# Patient Record
Sex: Female | Born: 1970 | Race: White | Hispanic: No | Marital: Married | Smoking: Former smoker
Health system: Southern US, Community
[De-identification: ages and names within clinical notes are randomized; demographics above are authoritative.]

## PROBLEM LIST (undated history)

## (undated) DIAGNOSIS — Z9889 Other specified postprocedural states: Secondary | ICD-10-CM

## (undated) DIAGNOSIS — Z923 Personal history of irradiation: Secondary | ICD-10-CM

## (undated) DIAGNOSIS — T4145XA Adverse effect of unspecified anesthetic, initial encounter: Secondary | ICD-10-CM

## (undated) DIAGNOSIS — I10 Essential (primary) hypertension: Secondary | ICD-10-CM

## (undated) DIAGNOSIS — R112 Nausea with vomiting, unspecified: Secondary | ICD-10-CM

## (undated) DIAGNOSIS — M539 Dorsopathy, unspecified: Secondary | ICD-10-CM

## (undated) DIAGNOSIS — E538 Deficiency of other specified B group vitamins: Secondary | ICD-10-CM

## (undated) DIAGNOSIS — T8859XA Other complications of anesthesia, initial encounter: Secondary | ICD-10-CM

## (undated) DIAGNOSIS — Z973 Presence of spectacles and contact lenses: Secondary | ICD-10-CM

## (undated) DIAGNOSIS — K219 Gastro-esophageal reflux disease without esophagitis: Secondary | ICD-10-CM

## (undated) DIAGNOSIS — C50919 Malignant neoplasm of unspecified site of unspecified female breast: Secondary | ICD-10-CM

## (undated) DIAGNOSIS — R519 Headache, unspecified: Secondary | ICD-10-CM

## (undated) DIAGNOSIS — Z87442 Personal history of urinary calculi: Secondary | ICD-10-CM

## (undated) DIAGNOSIS — C801 Malignant (primary) neoplasm, unspecified: Secondary | ICD-10-CM

## (undated) DIAGNOSIS — R51 Headache: Secondary | ICD-10-CM

## (undated) DIAGNOSIS — F419 Anxiety disorder, unspecified: Secondary | ICD-10-CM

## (undated) DIAGNOSIS — G479 Sleep disorder, unspecified: Secondary | ICD-10-CM

## (undated) DIAGNOSIS — R42 Dizziness and giddiness: Secondary | ICD-10-CM

## (undated) HISTORY — PX: REDUCTION MAMMAPLASTY: SUR839

---

## 2006-11-09 ENCOUNTER — Ambulatory Visit: Payer: Self-pay | Admitting: Obstetrics and Gynecology

## 2007-11-12 ENCOUNTER — Ambulatory Visit: Payer: Self-pay | Admitting: Obstetrics and Gynecology

## 2007-11-13 ENCOUNTER — Ambulatory Visit: Payer: Self-pay | Admitting: Obstetrics and Gynecology

## 2008-01-07 ENCOUNTER — Ambulatory Visit: Payer: Self-pay | Admitting: Internal Medicine

## 2008-11-25 ENCOUNTER — Ambulatory Visit: Payer: Self-pay | Admitting: Obstetrics and Gynecology

## 2009-12-11 ENCOUNTER — Ambulatory Visit: Payer: Self-pay | Admitting: Obstetrics and Gynecology

## 2010-01-26 ENCOUNTER — Ambulatory Visit: Payer: Self-pay | Admitting: Family Medicine

## 2010-04-14 ENCOUNTER — Ambulatory Visit: Payer: Self-pay | Admitting: Sports Medicine

## 2011-01-05 ENCOUNTER — Ambulatory Visit: Payer: Self-pay | Admitting: Obstetrics and Gynecology

## 2012-01-27 ENCOUNTER — Ambulatory Visit: Payer: Self-pay | Admitting: Obstetrics and Gynecology

## 2012-11-27 ENCOUNTER — Ambulatory Visit: Payer: Self-pay | Admitting: Family Medicine

## 2013-01-28 ENCOUNTER — Ambulatory Visit: Payer: Self-pay | Admitting: Obstetrics and Gynecology

## 2013-09-14 ENCOUNTER — Ambulatory Visit: Payer: Self-pay | Admitting: Family Medicine

## 2013-09-30 ENCOUNTER — Ambulatory Visit: Payer: Self-pay

## 2014-03-26 ENCOUNTER — Ambulatory Visit: Payer: Self-pay | Admitting: Obstetrics and Gynecology

## 2015-01-21 ENCOUNTER — Other Ambulatory Visit: Payer: Self-pay | Admitting: Obstetrics and Gynecology

## 2015-01-21 DIAGNOSIS — Z1231 Encounter for screening mammogram for malignant neoplasm of breast: Secondary | ICD-10-CM

## 2015-03-30 ENCOUNTER — Ambulatory Visit: Payer: Self-pay

## 2015-04-27 ENCOUNTER — Ambulatory Visit: Payer: Self-pay

## 2015-04-28 ENCOUNTER — Ambulatory Visit
Admission: RE | Admit: 2015-04-28 | Discharge: 2015-04-28 | Disposition: A | Discharge status: Home / Self Care | Payer: Managed Care, Other (non HMO) | Source: Ambulatory Visit | Attending: Obstetrics and Gynecology | Admitting: Obstetrics and Gynecology

## 2015-04-28 DIAGNOSIS — Z1231 Encounter for screening mammogram for malignant neoplasm of breast: Secondary | ICD-10-CM | POA: Insufficient documentation

## 2015-06-01 ENCOUNTER — Other Ambulatory Visit: Payer: Self-pay

## 2015-06-01 DIAGNOSIS — B379 Candidiasis, unspecified: Secondary | ICD-10-CM

## 2015-06-01 MED ORDER — FLUCONAZOLE 150 MG PO TABS: 150.0000 mg | ORAL_TABLET | Freq: Once | ORAL | Status: DC

## 2015-08-03 ENCOUNTER — Encounter: Payer: Self-pay | Admitting: Family Medicine

## 2015-08-03 ENCOUNTER — Ambulatory Visit (INDEPENDENT_AMBULATORY_CARE_PROVIDER_SITE_OTHER): Payer: Managed Care, Other (non HMO) | Admitting: Family Medicine

## 2015-08-03 VITALS — BP 134/80 | HR 80 | Ht 65.0 in | Wt 166.0 lb

## 2015-08-03 DIAGNOSIS — G43909 Migraine, unspecified, not intractable, without status migrainosus: Secondary | ICD-10-CM | POA: Insufficient documentation

## 2015-08-03 DIAGNOSIS — R030 Elevated blood-pressure reading, without diagnosis of hypertension: Secondary | ICD-10-CM | POA: Diagnosis not present

## 2015-08-03 DIAGNOSIS — IMO0001 Reserved for inherently not codable concepts without codable children: Secondary | ICD-10-CM

## 2015-08-03 DIAGNOSIS — J0100 Acute maxillary sinusitis, unspecified: Secondary | ICD-10-CM | POA: Diagnosis not present

## 2015-08-03 MED ORDER — FLUTICASONE PROPIONATE 50 MCG/ACT NA SUSP: 2.0000 | Freq: Every day | NASAL | Status: DC

## 2015-08-03 MED ORDER — MECLIZINE HCL 25 MG PO TABS: 25.0000 mg | ORAL_TABLET | Freq: Three times a day (TID) | ORAL | Status: DC | PRN

## 2015-08-03 MED ORDER — AMOXICILLIN 500 MG PO CAPS: 500.0000 mg | ORAL_CAPSULE | Freq: Three times a day (TID) | ORAL | Status: DC

## 2015-08-03 NOTE — Progress Notes (Signed)
Name: Tiffany Humphrey   MRN: [id number]    DOB: 1971-05-02   Date:08/03/2015       Progress Note  Subjective  Chief Complaint  Chief Complaint  Patient presents with  . Dizziness    Dizziness This is a new problem. The current episode started in the past 7 days. The problem occurs daily. The problem has been waxing and waning. Associated symptoms include congestion, coughing, headaches and vertigo. Pertinent negatives include no abdominal pain, chest pain, chills, diaphoresis, fever, myalgias, nausea, neck pain, numbness, rash, sore throat, visual change, vomiting or weakness. Nothing aggravates the symptoms. She has tried acetaminophen for the symptoms. The treatment provided mild relief.  Sinusitis This is a new problem. The current episode started in the past 7 days. The problem has been waxing and waning since onset. Her pain is at a severity of 5/10. The pain is moderate. Associated symptoms include congestion, coughing, headaches and sinus pressure. Pertinent negatives include no chills, diaphoresis, ear pain, hoarse voice, neck pain, shortness of breath or sore throat. The treatment provided mild relief.    No problem-specific assessment & plan notes found for this encounter.   History reviewed. No pertinent past medical history.  History reviewed. No pertinent past surgical history.  Family History  Problem Relation Age of Onset  . Breast cancer Mother 68  . Breast cancer Paternal Grandmother     Social History   Social History  . Marital Status: Married    Spouse Name: N/A  . Number of Children: N/A  . Years of Education: N/A   Occupational History  . Not on file.   Social History Main Topics  . Smoking status: Former Research scientist (life sciences)  . Smokeless tobacco: Not on file  . Alcohol Use: No  . Drug Use: No  . Sexual Activity: Yes   Other Topics Concern  . Not on file   Social History Narrative    Allergies  Allergen Reactions  . Codeine Other (See Comments)   tachycardia     Review of Systems  Constitutional: Negative for fever, chills, weight loss, malaise/fatigue and diaphoresis.  HENT: Positive for congestion and sinus pressure. Negative for ear discharge, ear pain, hoarse voice and sore throat.   Eyes: Negative for blurred vision.  Respiratory: Positive for cough. Negative for sputum production, shortness of breath and wheezing.   Cardiovascular: Negative for chest pain, palpitations and leg swelling.  Gastrointestinal: Negative for heartburn, nausea, vomiting, abdominal pain, diarrhea, constipation, blood in stool and melena.  Genitourinary: Negative for dysuria, urgency, frequency and hematuria.  Musculoskeletal: Negative for myalgias, back pain, joint pain and neck pain.  Skin: Negative for rash.  Neurological: Positive for dizziness, vertigo and headaches. Negative for tingling, sensory change, focal weakness, weakness and numbness.  Endo/Heme/Allergies: Negative for environmental allergies and polydipsia. Does not bruise/bleed easily.  Psychiatric/Behavioral: Negative for depression and suicidal ideas. The patient is not nervous/anxious and does not have insomnia.      Objective  Filed Vitals:   08/03/15 1347  BP: 134/80  Pulse: 80  Height: 5\' 5"  (1.651 m)  Weight: 166 lb (75.297 kg)    Physical Exam  Constitutional: She is well-developed, well-nourished, and in no distress. No distress.  HENT:  Head: Normocephalic and atraumatic.  Right Ear: External ear normal. A middle ear effusion is present.  Left Ear: External ear normal. A middle ear effusion is present.  Nose: Nose normal.  Mouth/Throat: Oropharynx is clear and moist.  Eyes: Conjunctivae and EOM are normal. Pupils  are equal, round, and reactive to light. Right eye exhibits no discharge. Left eye exhibits no discharge.  Neck: Normal range of motion. Neck supple. No JVD present. No thyromegaly present.  Cardiovascular: Normal rate, regular rhythm, normal heart sounds  and intact distal pulses.  Exam reveals no gallop and no friction rub.   No murmur heard. Pulmonary/Chest: Effort normal and breath sounds normal.  Abdominal: Soft. Bowel sounds are normal. She exhibits no mass. There is no tenderness. There is no guarding.  Musculoskeletal: Normal range of motion. She exhibits no edema.  Lymphadenopathy:    She has no cervical adenopathy.  Neurological: She is alert. She has normal reflexes.  Skin: Skin is warm and dry. She is not diaphoretic.  Psychiatric: Mood and affect normal.      Assessment & Plan  Problem List Items Addressed This Visit    None    Visit Diagnoses    Acute maxillary sinusitis, recurrence not specified    -  Primary    Relevant Medications    amoxicillin (AMOXIL) 500 MG capsule    fluticasone (FLONASE) 50 MCG/ACT nasal spray    Elevated blood pressure        Relevant Medications    meclizine (ANTIVERT) 25 MG tablet    fluticasone (FLONASE) 50 MCG/ACT nasal spray         Dr. Justis Dupas [facility]  08/03/2015

## 2015-08-03 NOTE — Patient Instructions (Signed)
Labyrinthitis Labyrinthitis is an infection of the inner ear. Your inner ear is a fluid-filled system of tubes and canals (labyrinth). Nerve cells in your inner ear send signals for hearing and balance to your brain. When tiny germs (microorganisms) get inside the labyrinth, they harm the cells that send messages to the brain. Labyrinthitis can cause changes in hearing and balance. Most cases of labyrinthitis come on suddenly and they clear up within weeks. If the infection damages parts of the labyrinth, some symptoms may remain (chronic labyrinthitis). CAUSES Viruses are the most common cause of labyrinthitis. Viruses that spread into the labyrinth are the same viruses that cause other diseases, such as:  Mononucleosis.  Measles.  Flu.  Herpes. Bacteria can also cause labyrinthitis when they spread into the labyrinth from an infection in the brain or the middle ear. Bacteria can cause:  Serous labyrinthitis. This type of labyrinthitis develops when bacteria produce a poison (toxin) that gets inside the labyrinth.  Suppurative labyrinthitis. This type of labyrinthitis develops when bacteria get inside the labyrinth. RISK FACTORS You may be at greater risk for labyrinthitis if you:  Drink a lot of alcohol.  Smoke.  Take certain drugs.  Are not well rested (fatigued).  Are under a lot of stress.  Have allergies.  Recently had a nose or throat infection (upper respiratory infection) or an ear infection. SYMPTOMS Symptoms of labyrinthitis usually start suddenly. The symptoms can be mild or strong and may include:  Dizziness.  Hearing loss.  A feeling that you are moving when you are not (vertigo).  Ringing in the ear (tinnitus).  Nausea and vomiting.  Trouble focusing your eyes. Symptoms of chronic labyrinthitis may include:  Fatigue.  Confusion.  Hearing loss.  Tinnitus.  Poor balance.  Vertigo after sudden head movements. DIAGNOSIS Your health care  provider may suspect labyrinthitis if you suddenly get dizzy and lose hearing, especially if you had a recent upper respiratory infection. Your health care provider will perform a physical exam to:  Check your ears for infection.  Test your balance.  Check your eye movement. Your health care provider may do several tests to rule out other causes of your symptoms and to help make a diagnosis of labyrinthitis. These may include:  Imaging studies, such as a CT scan or an MRI, to look for other causes of your symptoms.  Hearing tests.  Electronystagmography (ENG) to check your balance. TREATMENT Treatment of labyrinthitis depends on the cause. If your labyrinthitis is caused by a virus, it may get better without treatment. If your labyrinthitis is caused by bacteria, you may need medicine to fight the infection (antibiotic medicine). You may also have treatment to relieve labyrinthitis symptoms. Treatments may include:  Medicines to:  Stop dizziness.  Relieve nausea.  Treat the inflamed area.  Speed up your recovery.  Bed rest until dizziness goes away.  Fluids given through an IV tube. You may need this treatment if you have too little fluid in your body (dehydrated) from repeated nausea and vomiting. HOME CARE INSTRUCTIONS  Take medicines only as directed by your health care provider.  If you were prescribed an antibiotic medicine, finish all of it even if you start to feel better.  Rest as much as possible.  Avoid loud noises and bright lights.  Do not make sudden movements until any dizziness goes away.  Do not drive until your health care provider says that you can.  Drink enough fluid to keep your urine clear or pale yellow.  Work with a physical therapist if you still feel dizzy after several weeks. A therapist can teach you exercises to help you adjust to feeling dizzy (vestibular rehabilitation exercises).  Keep all follow-up visits as directed by your health care  provider. This is important. SEEK MEDICAL CARE IF:  Your symptoms are not relieved by medicines.  Your symptoms last longer than two weeks.  You have a fever. SEEK IMMEDIATE MEDICAL CARE IF:  You become very dizzy.  You have nausea or vomiting that does not go away.  Your hearing gets much worse very quickly.   This information is not intended to replace advice given to you by your health care provider. Make sure you discuss any questions you have with your health care provider.   Document Released: 09/05/2014 Document Reviewed: 09/05/2014 Elsevier Interactive Patient Education 2016 [employer] Serous Otitis Media Serous otitis media is fluid in the middle ear space. This space contains the bones for hearing and air. Air in the middle ear space helps to transmit sound.  The air gets there through the eustachian tube. This tube goes from the back of the nose (nasopharynx) to the middle ear space. It keeps the pressure in the middle ear the same as the outside world. It also helps to drain fluid from the middle ear space. CAUSES  Serous otitis media occurs when the eustachian tube gets blocked. Blockage can come from:  Ear infections.  Colds and other upper respiratory infections.  Allergies.  Irritants such as cigarette smoke.  Sudden changes in air pressure (such as descending in an airplane).  Enlarged adenoids.  A mass in the nasopharynx. During colds and upper respiratory infections, the middle ear space can become temporarily filled with fluid. This can happen after an ear infection also. Once the infection clears, the fluid will generally drain out of the ear through the eustachian tube. If it does not, then serous otitis media occurs. SIGNS AND SYMPTOMS   Hearing loss.  A feeling of fullness in the ear, without pain.  Young children may not show any symptoms but may show slight behavioral changes, such as agitation, ear pulling, or crying. DIAGNOSIS  Serous  otitis media is diagnosed by an ear exam. Tests may be done to check on the movement of the eardrum. Hearing exams may also be done. TREATMENT  The fluid most often goes away without treatment. If allergy is the cause, allergy treatment may be helpful. Fluid that persists for several months may require minor surgery. A small tube is placed in the eardrum to:  Drain the fluid.  Restore the air in the middle ear space. In certain situations, antibiotic medicines are used to avoid surgery. Surgery may be done to remove enlarged adenoids (if this is the cause). HOME CARE INSTRUCTIONS   Keep children away from tobacco smoke.  Keep all follow-up visits as directed by your health care provider. SEEK MEDICAL CARE IF:   Your hearing is not better in 3 months.  Your hearing is worse.  You have ear pain.  You have drainage from the ear.  You have dizziness.  You have serous otitis media only in one ear or have any bleeding from your nose (epistaxis).  You notice a lump on your neck. MAKE SURE YOU:  Understand these instructions.   Will watch your condition.   Will get help right away if you are not doing well or get worse.    This information is not intended to replace advice given to  you by your health care provider. Make sure you discuss any questions you have with your health care provider.   Document Released: 11/05/2003 Document Revised: 09/05/2014 Document Reviewed: 03/12/2013 Elsevier Interactive Patient Education [employer].

## 2015-08-14 ENCOUNTER — Other Ambulatory Visit: Payer: Self-pay

## 2015-08-14 DIAGNOSIS — B379 Candidiasis, unspecified: Secondary | ICD-10-CM

## 2015-08-14 MED ORDER — FLUCONAZOLE 150 MG PO TABS: 150.0000 mg | ORAL_TABLET | Freq: Once | ORAL | Status: DC

## 2015-08-17 ENCOUNTER — Other Ambulatory Visit: Payer: Self-pay

## 2015-08-17 MED ORDER — AZITHROMYCIN 250 MG PO TABS: ORAL_TABLET | ORAL | Status: DC

## 2015-09-11 ENCOUNTER — Encounter: Payer: Self-pay | Admitting: Family Medicine

## 2015-09-11 ENCOUNTER — Ambulatory Visit (INDEPENDENT_AMBULATORY_CARE_PROVIDER_SITE_OTHER): Payer: Managed Care, Other (non HMO) | Admitting: Family Medicine

## 2015-09-11 VITALS — BP 120/80 | HR 78 | Ht 65.0 in | Wt 165.0 lb

## 2015-09-11 DIAGNOSIS — J0100 Acute maxillary sinusitis, unspecified: Secondary | ICD-10-CM | POA: Diagnosis not present

## 2015-09-11 MED ORDER — BENZONATATE 100 MG PO CAPS: 100.0000 mg | ORAL_CAPSULE | Freq: Two times a day (BID) | ORAL | Status: DC | PRN

## 2015-09-11 MED ORDER — LEVOFLOXACIN 500 MG PO TABS: 500.0000 mg | ORAL_TABLET | Freq: Every day | ORAL | Status: DC

## 2015-09-11 MED ORDER — AMOXICILLIN-POT CLAVULANATE 875-125 MG PO TABS: 1.0000 | ORAL_TABLET | Freq: Two times a day (BID) | ORAL | Status: DC

## 2015-09-11 NOTE — Progress Notes (Signed)
Name: Tiffany Humphrey   MRN: [id number]    DOB: September 02, 1970   Date:09/11/2015       Progress Note  Subjective  Chief Complaint  Chief Complaint  Patient presents with  . Sinusitis    had Amoxil 12/5 followed by [facility]- still having cough and cong, sore throat and earache    Sinusitis This is a recurrent problem. The current episode started 1 to 4 weeks ago. The problem is unchanged. The maximum temperature recorded prior to her arrival was 100.4 - 100.9 F. The fever has been present for 1 to 2 days. The pain is moderate. Associated symptoms include chills, congestion, coughing, ear pain, headaches, sinus pressure, sneezing, a sore throat and swollen glands. Pertinent negatives include no diaphoresis, neck pain or shortness of breath. Past treatments include nothing. The treatment provided no relief.    No problem-specific assessment & plan notes found for this encounter.   History reviewed. No pertinent past medical history.  History reviewed. No pertinent past surgical history.  Family History  Problem Relation Age of Onset  . Breast cancer Mother 63  . Breast cancer Paternal Grandmother     Social History   Social History  . Marital Status: Married    Spouse Name: N/A  . Number of Children: N/A  . Years of Education: N/A   Occupational History  . Not on file.   Social History Main Topics  . Smoking status: Former Research scientist (life sciences)  . Smokeless tobacco: Not on file  . Alcohol Use: No  . Drug Use: No  . Sexual Activity: Yes   Other Topics Concern  . Not on file   Social History Narrative    Allergies  Allergen Reactions  . Codeine Other (See Comments)    tachycardia     Review of Systems  Constitutional: Positive for chills. Negative for fever, weight loss, malaise/fatigue and diaphoresis.  HENT: Positive for congestion, ear pain, sinus pressure, sneezing and sore throat. Negative for ear discharge.   Eyes: Negative for blurred vision.  Respiratory: Positive for  cough. Negative for sputum production, shortness of breath and wheezing.   Cardiovascular: Negative for chest pain, palpitations and leg swelling.  Gastrointestinal: Negative for heartburn, nausea, abdominal pain, diarrhea, constipation, blood in stool and melena.  Genitourinary: Negative for dysuria, urgency, frequency and hematuria.  Musculoskeletal: Negative for myalgias, back pain, joint pain and neck pain.  Skin: Negative for rash.  Neurological: Positive for headaches. Negative for dizziness, tingling, sensory change and focal weakness.  Endo/Heme/Allergies: Negative for environmental allergies and polydipsia. Does not bruise/bleed easily.  Psychiatric/Behavioral: Negative for depression and suicidal ideas. The patient is not nervous/anxious and does not have insomnia.      Objective  Filed Vitals:   09/11/15 1056  BP: 120/80  Pulse: 78  Height: 5\' 5"  (1.651 m)  Weight: 165 lb (74.844 kg)    Physical Exam  Constitutional: She is well-developed, well-nourished, and in no distress. No distress.  HENT:  Head: Normocephalic and atraumatic.  Right Ear: External ear normal. Tympanic membrane is not retracted.  Left Ear: External ear normal. Tympanic membrane is retracted.  Nose: Nose normal.  Mouth/Throat: Posterior oropharyngeal erythema present.  Eyes: Conjunctivae and EOM are normal. Pupils are equal, round, and reactive to light. Right eye exhibits no discharge. Left eye exhibits no discharge.  Neck: Normal range of motion. Neck supple. No JVD present. No thyromegaly present.  Cardiovascular: Normal rate, regular rhythm, normal heart sounds and intact distal pulses.  Exam reveals no  gallop and no friction rub.   No murmur heard. Pulmonary/Chest: Effort normal and breath sounds normal.  Abdominal: Soft. Bowel sounds are normal. She exhibits no mass. There is no tenderness. There is no guarding.  Musculoskeletal: Normal range of motion. She exhibits no edema.  Lymphadenopathy:     She has no cervical adenopathy.  Neurological: She is alert. She has normal reflexes.  Skin: Skin is warm and dry. She is not diaphoretic.  Psychiatric: Mood and affect normal.  Nursing note and vitals reviewed.     Assessment & Plan  Problem List Items Addressed This Visit    None    Visit Diagnoses    Acute maxillary sinusitis, recurrence not specified    -  Primary    Relevant Medications    levofloxacin (LEVAQUIN) 500 MG tablet    benzonatate (TESSALON) 100 MG capsule         Dr. [facility]  09/11/2015

## 2015-09-11 NOTE — Addendum Note (Signed)
Addended by: Fredderick Severance on: 09/11/2015 05:02 PM   Modules accepted: Orders

## 2016-01-08 ENCOUNTER — Ambulatory Visit
Admission: RE | Admit: 2016-01-08 | Discharge: 2016-01-08 | Disposition: A | Discharge status: Home / Self Care | Payer: Managed Care, Other (non HMO) | Source: Ambulatory Visit | Attending: Family Medicine | Admitting: Family Medicine

## 2016-01-08 ENCOUNTER — Ambulatory Visit (INDEPENDENT_AMBULATORY_CARE_PROVIDER_SITE_OTHER): Payer: Managed Care, Other (non HMO) | Admitting: Family Medicine

## 2016-01-08 ENCOUNTER — Encounter: Payer: Self-pay | Admitting: Family Medicine

## 2016-01-08 VITALS — BP 110/80 | HR 64 | Ht 65.0 in | Wt 178.0 lb

## 2016-01-08 DIAGNOSIS — M5090 Cervical disc disorder, unspecified, unspecified cervical region: Secondary | ICD-10-CM

## 2016-01-08 MED ORDER — ETODOLAC 500 MG PO TABS: 500.0000 mg | ORAL_TABLET | Freq: Two times a day (BID) | ORAL | Status: DC

## 2016-01-08 MED ORDER — PREDNISONE 10 MG PO TABS: 10.0000 mg | ORAL_TABLET | Freq: Every day | ORAL | Status: DC

## 2016-01-08 NOTE — Progress Notes (Signed)
Name: Tiffany Humphrey   MRN: [id number]    DOB: 07-29-1971   Date:01/08/2016       Progress Note  Subjective  Chief Complaint  Chief Complaint  Patient presents with  . Shoulder Pain    L) shoulder pain radiates down arm and has tingling in hand that leads to "drawing up"    Shoulder Pain  The pain is present in the neck, left shoulder, left arm, left elbow, left wrist, left hand and left fingers. This is a new problem. The current episode started 1 to 4 weeks ago. There has been no history of extremity trauma. The problem occurs daily. The problem has been waxing and waning. The quality of the pain is described as aching. The pain is at a severity of 7/10. The pain is moderate. Associated symptoms include stiffness and tingling. Pertinent negatives include no fever, joint locking, joint swelling, limited range of motion or numbness. She has tried NSAIDS for the symptoms. The treatment provided mild relief.    No problem-specific assessment & plan notes found for this encounter.   No past medical history on file.  No past surgical history on file.  Family History  Problem Relation Age of Onset  . Breast cancer Mother 72  . Breast cancer Paternal Grandmother     Social History   Social History  . Marital Status: Married    Spouse Name: N/A  . Number of Children: N/A  . Years of Education: N/A   Occupational History  . Not on file.   Social History Main Topics  . Smoking status: Former Research scientist (life sciences)  . Smokeless tobacco: Not on file  . Alcohol Use: No  . Drug Use: No  . Sexual Activity: Yes   Other Topics Concern  . Not on file   Social History Narrative    Allergies  Allergen Reactions  . Codeine Other (See Comments)    tachycardia  . Quinolones Hives     Review of Systems  Constitutional: Negative for fever, chills, weight loss and malaise/fatigue.  HENT: Negative for ear discharge, ear pain and sore throat.   Eyes: Negative for blurred vision.  Respiratory:  Negative for cough, sputum production, shortness of breath and wheezing.   Cardiovascular: Negative for chest pain, palpitations and leg swelling.  Gastrointestinal: Negative for heartburn, nausea, abdominal pain, diarrhea, constipation, blood in stool and melena.  Genitourinary: Negative for dysuria, urgency, frequency and hematuria.  Musculoskeletal: Positive for stiffness. Negative for myalgias, back pain, joint pain and neck pain.  Skin: Negative for rash.  Neurological: Positive for tingling and focal weakness. Negative for dizziness, sensory change, numbness and headaches.       Left hand "draw"  Endo/Heme/Allergies: Negative for environmental allergies and polydipsia. Does not bruise/bleed easily.  Psychiatric/Behavioral: Negative for depression and suicidal ideas. The patient is not nervous/anxious and does not have insomnia.      Objective  Filed Vitals:   01/08/16 0804  BP: 110/80  Pulse: 64  Height: 5\' 5"  (1.651 m)  Weight: 178 lb (80.74 kg)    Physical Exam  Constitutional: She is well-developed, well-nourished, and in no distress. No distress.  HENT:  Head: Normocephalic and atraumatic.  Right Ear: External ear normal.  Left Ear: External ear normal.  Nose: Nose normal.  Mouth/Throat: Oropharynx is clear and moist.  Eyes: Conjunctivae and EOM are normal. Pupils are equal, round, and reactive to light. Right eye exhibits no discharge. Left eye exhibits no discharge.  Neck: Normal range of motion.  Neck supple. No JVD present. No thyromegaly present.  Cardiovascular: Normal rate, regular rhythm, normal heart sounds and intact distal pulses.  Exam reveals no gallop and no friction rub.   No murmur heard. Pulmonary/Chest: Effort normal and breath sounds normal.  Abdominal: Soft. Bowel sounds are normal. She exhibits no mass. There is no tenderness. There is no guarding.  Musculoskeletal: Normal range of motion. She exhibits no edema.       Cervical back: She exhibits  spasm.  Lymphadenopathy:    She has no cervical adenopathy.  Neurological: She is alert. She has normal motor skills, normal strength, normal reflexes and intact cranial nerves. A sensory deficit is present.  Left ulnar  Skin: Skin is warm and dry. She is not diaphoretic.  Psychiatric: Mood and affect normal.  Nursing note and vitals reviewed.     Assessment & Plan  Problem List Items Addressed This Visit    None    Visit Diagnoses    Cervical disc disease    -  Primary    Relevant Medications    predniSONE (DELTASONE) 10 MG tablet    etodolac (LODINE) 500 MG tablet    Other Relevant Orders    DG Cervical Spine Complete         Dr. [facility]  01/08/2016

## 2016-01-08 NOTE — Patient Instructions (Signed)
Degenerative Disk Disease  Degenerative disk disease is a condition caused by the changes that occur in spinal disks as you grow older. Spinal disks are soft and compressible disks located between the bones of your spine (vertebrae). These disks act like shock absorbers. Degenerative disk disease can affect the whole spine. However, the neck and lower back are most commonly affected. Many changes can occur in the spinal disks with aging, such as:  · The spinal disks may dry and shrink.  · Small tears may occur in the tough, outer covering of the disk (annulus).  · The disk space may become smaller due to loss of water.  · Abnormal growths in the bone (spurs) may occur. This can put pressure on the nerve roots exiting the spinal canal, causing pain.  · The spinal canal may become narrowed.  RISK FACTORS   · Being overweight.  · Having a family history of degenerative disk disease.  · Smoking.  · There is increased risk if you are doing heavy lifting or have a sudden injury.  SIGNS AND SYMPTOMS   Symptoms vary from person to person and may include:  · Pain that varies in intensity. Some people have no pain, while others have severe pain. The location of the pain depends on the part of your backbone that is affected.  ¨ You will have neck or arm pain if a disk in the neck area is affected.  ¨ You will have pain in your back, buttocks, or legs if a disk in the lower back is affected.  · Pain that becomes worse while bending, reaching up, or with twisting movements.  · Pain that may start gradually and then get worse as time passes. It may also start after a major or minor injury.  · Numbness or tingling in the arms or legs.  DIAGNOSIS   Your health care provider will ask you about your symptoms and about activities or habits that may cause the pain. He or she may also ask about any injuries, diseases, or treatments you have had. Your health care provider will examine you to check for the range of movement that is  possible in the affected area, to check for strength in your extremities, and to check for sensation in the areas of the arms and legs supplied by different nerve roots. You may also have:   · An X-ray of the spine.  · Other imaging tests, such as MRI.  TREATMENT   Your health care provider will advise you on the best plan for treatment. Treatment may include:  · Medicines.  · Rehabilitation exercises.  HOME CARE INSTRUCTIONS   · Follow proper lifting and walking techniques as advised by your health care provider.  · Maintain good posture.  · Exercise regularly as advised by your health care provider.  · Perform relaxation exercises.  · Change your sitting, standing, and sleeping habits as advised by your health care provider.  · Change positions frequently.  · Lose weight or maintain a healthy weight as advised by your health care provider.  · Do not use any tobacco products, including cigarettes, chewing tobacco, or electronic cigarettes. If you need help quitting, ask your health care provider.  · Wear supportive footwear.  · Take medicines only as directed by your health care provider.  SEEK MEDICAL CARE IF:   · Your pain does not go away within 1-4 weeks.  · You have significant appetite or weight loss.  SEEK IMMEDIATE MEDICAL CARE IF:   ·   Your pain is severe.  · You notice weakness in your arms, hands, or legs.  · You begin to lose control of your bladder or bowel movements.  · You have fevers or night sweats.  MAKE SURE YOU:   · Understand these instructions.  · Will watch your condition.  · Will get help right away if you are not doing well or get worse.     This information is not intended to replace advice given to you by your health care provider. Make sure you discuss any questions you have with your health care provider.     Document Released: 06/12/2007 Document Revised: 09/05/2014 Document Reviewed: 12/17/2013  Elsevier Interactive Patient Education ©2016 [employer]

## 2016-01-15 ENCOUNTER — Ambulatory Visit: Payer: Managed Care, Other (non HMO) | Admitting: Family Medicine

## 2016-01-28 ENCOUNTER — Other Ambulatory Visit: Payer: Self-pay | Admitting: Obstetrics and Gynecology

## 2016-01-28 DIAGNOSIS — Z1231 Encounter for screening mammogram for malignant neoplasm of breast: Secondary | ICD-10-CM

## 2016-02-01 ENCOUNTER — Ambulatory Visit
Admission: EM | Admit: 2016-02-01 | Discharge: 2016-02-01 | Disposition: A | Discharge status: Home / Self Care | Payer: Managed Care, Other (non HMO) | Attending: Emergency Medicine | Admitting: Emergency Medicine

## 2016-02-01 DIAGNOSIS — R0781 Pleurodynia: Secondary | ICD-10-CM

## 2016-02-01 LAB — URINALYSIS COMPLETE WITH MICROSCOPIC (ARMC ONLY)
Bacteria, UA: NONE SEEN
Bilirubin Urine: NEGATIVE
GLUCOSE, UA: NEGATIVE mg/dL
KETONES UR: NEGATIVE mg/dL
Leukocytes, UA: NEGATIVE
NITRITE: NEGATIVE
PH: 7.5 (ref 5.0–8.0)
Protein, ur: NEGATIVE mg/dL
SPECIFIC GRAVITY, URINE: 1.02 (ref 1.005–1.030)
WBC, UA: NONE SEEN WBC/hpf (ref 0–5)

## 2016-02-01 MED ORDER — CYCLOBENZAPRINE HCL 10 MG PO TABS
10.0000 mg | ORAL_TABLET | Freq: Three times a day (TID) | ORAL | Status: DC | PRN
Start: 2016-02-01 — End: 2018-03-19

## 2016-02-01 MED ORDER — TRAMADOL HCL 50 MG PO TABS
50.0000 mg | ORAL_TABLET | Freq: Four times a day (QID) | ORAL | Status: DC | PRN
Start: 2016-02-01 — End: 2017-05-29

## 2016-02-01 MED ORDER — IBUPROFEN 800 MG PO TABS: 800.0000 mg | ORAL_TABLET | Freq: Three times a day (TID) | ORAL | Status: DC

## 2016-02-01 NOTE — Discharge Instructions (Signed)
follow-up with Dr. Ronnald Ramp in several days. Take 800 mg of ibuprofen with 1 g of Tylenol up to 3 times a day as needed for pain. Take the tramadol for severe pain in them Flexeril as prescribed. Go to the ER if your pain changes, for shortness of breath, fevers, if you start coughing up blood, if you feel like you're about to pass out, or other concerns.

## 2016-02-01 NOTE — ED Provider Notes (Signed)
HPI  SUBJECTIVE:  Tiffany Humphrey is a 45 y.o. female who presents with  right upper quadrant/right lower rib pain for the past week. She states it is sharp, intermittent, present only with movement and palpitation. States that last seconds. There is no migration or radiation. Symptoms are worse with palpation, deep inspiration, movement, better with sitting still. She has tried tramadol and over-the-counter pain patches. She states that she has been playing in the pool with her children, but denies any other change in her physical activity. She denies nausea, vomiting, fevers, chest pain, shortness of breath, coughing, wheezing, hemoptysis. There is no other abdominal pain. She has no vaginal complaints. No back pain. No lower extremity edema, calf pain or swelling, surgery in the past 4 weeks, prolonged immobilization. She is on OCPs. No presyncope, syncope. Had a normal bowel movement 2 days ago, states this is her normal stooling pattern. No trauma to the chest, rash. She has never had symptoms like this before. No antipyretic in the past 4-6 hours. Past medical history of nephrolithiasis. She is a former smoker. negative for DVT, PE, pneumothorax, pneumonia, asthma, emphysema, COPD ED. No history of gallbladder disease, pancreatitis, pyelonephritis. No history of alcohol abuse, excessive NSAID use. No history of diabetes, hypertension, cancer. LMP: 5/31. She denies the possibility of being pregnant, states do not need to check. PMD: Dr. Otilio Miu.    Past Medical History  Diagnosis Date  . Kidney disease     History reviewed. No pertinent past surgical history.  Family History  Problem Relation Age of Onset  . Breast cancer Mother 66  . Breast cancer Paternal Grandmother     Social History  Substance Use Topics  . Smoking status: Former Research scientist (life sciences)  . Smokeless tobacco: None  . Alcohol Use: 0.0 oz/week    0 Standard drinks or equivalent per week     Comment: social    No current  facility-administered medications for this encounter.  Current outpatient prescriptions:  .  fluticasone (FLONASE) 50 MCG/ACT nasal spray, Place 2 sprays into both nostrils daily., Disp: 16 g, Rfl: 6 .  hydrochlorothiazide (MICROZIDE) 12.5 MG capsule, Take 1 capsule by mouth daily., Disp: , Rfl:  .  levonorgestrel-ethinyl estradiol (SEASONALE,INTROVALE,JOLESSA) 0.15-0.03 MG tablet, Take 1 tablet by mouth daily., Disp: , Rfl:  .  cyclobenzaprine (FLEXERIL) 10 MG tablet, Take 1 tablet (10 mg total) by mouth 3 (three) times daily as needed for muscle spasms., Disp: 20 tablet, Rfl: 0 .  ibuprofen (ADVIL,MOTRIN) 800 MG tablet, Take 1 tablet (800 mg total) by mouth 3 (three) times daily., Disp: 30 tablet, Rfl: 0 .  traMADol (ULTRAM) 50 MG tablet, Take 1 tablet (50 mg total) by mouth every 6 (six) hours as needed. Dr Ouida Sills, Disp: 30 tablet, Rfl: 0  Allergies  Allergen Reactions  . Codeine Other (See Comments)    tachycardia  . Quinolones Hives     ROS  As noted in HPI.   Physical Exam  BP 120/87 mmHg  Pulse 99  Temp(Src) 97.7 F (36.5 C) (Oral)  Resp 18  Ht 5\' 5"  (1.651 m)  Wt 165 lb (74.844 kg)  BMI 27.46 kg/m2  SpO2 100%  LMP 10/28/2015  Filed Vitals:   02/01/16 1055 02/01/16 1128  BP: 120/87   Pulse: 110 99  Temp: 97.7 F (36.5 C)   TempSrc: Oral   Resp: 18   Height: 5\' 5"  (1.651 m)   Weight: 165 lb (74.844 kg)   SpO2: 100%  Constitutional: Well developed, well nourished, mild painful distress. Eyes:  EOMI, conjunctiva normal bilaterally HENT: Normocephalic, atraumatic,mucus membranes moist Respiratory: Normal inspiratory effort, lungs clear bilaterally. Cardiovascular: Regular tachycardia, no murmurs, rubs, gallops Chest: Positive point tenderness along anterior right lower ribs. No bruising, crepitus, rash. GI: Normal appearance, soft, nontender, nondistended. Normal bowel sounds. Back: No CVA tenderness skin: No rash, skin intact Musculoskeletal:  Calves symmetric, nontender. no deformities Neurologic: Alert & oriented x 3, no focal neuro deficits Psychiatric: Speech and behavior appropriate   ED Course   Medications - No data to display  Orders Placed This Encounter  Procedures  . Urinalysis complete, with microscopic    Standing Status: Standing     Number of Occurrences: 1     Standing Expiration Date:     Results for orders placed or performed during the hospital encounter of 02/01/16 (from the past 24 hour(s))  Urinalysis complete, with microscopic     Status: Abnormal   Collection Time: 02/01/16 11:25 AM  Result Value Ref Range   Color, Urine YELLOW YELLOW   APPearance CLEAR CLEAR   Glucose, UA NEGATIVE NEGATIVE mg/dL   Bilirubin Urine NEGATIVE NEGATIVE   Ketones, ur NEGATIVE NEGATIVE mg/dL   Specific Gravity, Urine 1.020 1.005 - 1.030   Hgb urine dipstick TRACE (A) NEGATIVE   pH 7.5 5.0 - 8.0   Protein, ur NEGATIVE NEGATIVE mg/dL   Nitrite NEGATIVE NEGATIVE   Leukocytes, UA NEGATIVE NEGATIVE   RBC / HPF 0-5 0 - 5 RBC/hpf   WBC, UA NONE SEEN 0 - 5 WBC/hpf   Bacteria, UA NONE SEEN NONE SEEN   Squamous Epithelial / LPF 0-5 (A) NONE SEEN   Budding Yeast PRESENT    No results found.  ED Clinical Impression  Rib pain on right side   ED Assessment/Plan  Patient declined pain medication. Presentation suggestive of pleurodynia/right lower rib pain. In the absence of trauma, crepitus, coughing, deferring chest x-ray. Think that fracture is not likely. She does have focal tenderness in the right lower ribs, so this could be her gallbladder. but in the absence of a negative Murphy,  fevers and not associated with eating, think that cholecystitis is less likely. Doubt pneumonia in the absence of cough, body aches, fevers. Doubt pneumothorax as she has no shortness of breath and is satting 100% on room air. Given the tachycardia, PE was considered, however, her heart rate did go down below 100 and she is satting 100%  on room air. Tachycardia may have been from pain. Her only risk factor for DVT is her exogenous estrogen, but she has no signs of DVT at this time. Had an extensive discussion with patient about doing a d-dimer and pursuing a PE workup, but Patient states that she is okay with not doing this at this time. She is willing to try conservative management first.   Plan to send home with ibuprofen 800 mg 3 times a day with Tylenol, Flexeril as she states that that works well for her, we'll refill tramadol.   UA reviewed. Trace hemoglobin, no RBCs. Doubt nephrolithiasis. She has no urinary complaints. Yeast present. Discussed this finding with patient. However, patient is not having any GU symptoms, so we will not treat.  Follow up with PMD. Gave patient strict ER return precautions. Discussed labs,  MDM, plan and followup with patient. gave her very strict return precautions and when to go to the ED. Patient agrees with plan.   *This clinic note was created using Dragon  dictation software. Therefore, there may be occasional mistakes despite careful proofreading.  ?   Melynda Ripple, MD 02/01/16 1558

## 2016-04-28 ENCOUNTER — Ambulatory Visit
Admission: RE | Admit: 2016-04-28 | Discharge: 2016-04-28 | Disposition: A | Discharge status: Home / Self Care | Payer: Managed Care, Other (non HMO) | Source: Ambulatory Visit | Attending: Obstetrics and Gynecology | Admitting: Obstetrics and Gynecology

## 2016-04-28 ENCOUNTER — Other Ambulatory Visit: Payer: Self-pay | Admitting: Obstetrics and Gynecology

## 2016-04-28 DIAGNOSIS — R928 Other abnormal and inconclusive findings on diagnostic imaging of breast: Secondary | ICD-10-CM | POA: Diagnosis not present

## 2016-04-28 DIAGNOSIS — Z1231 Encounter for screening mammogram for malignant neoplasm of breast: Secondary | ICD-10-CM | POA: Diagnosis not present

## 2016-05-04 ENCOUNTER — Other Ambulatory Visit: Payer: Self-pay | Admitting: Obstetrics and Gynecology

## 2016-05-04 DIAGNOSIS — R928 Other abnormal and inconclusive findings on diagnostic imaging of breast: Secondary | ICD-10-CM

## 2016-05-04 DIAGNOSIS — N6310 Unspecified lump in the right breast, unspecified quadrant: Secondary | ICD-10-CM

## 2016-05-17 ENCOUNTER — Ambulatory Visit
Admission: RE | Admit: 2016-05-17 | Discharge: 2016-05-17 | Disposition: A | Discharge status: Home / Self Care | Payer: Managed Care, Other (non HMO) | Source: Ambulatory Visit | Attending: Obstetrics and Gynecology | Admitting: Obstetrics and Gynecology

## 2016-05-17 DIAGNOSIS — N6081 Other benign mammary dysplasias of right breast: Secondary | ICD-10-CM | POA: Diagnosis not present

## 2016-05-17 DIAGNOSIS — R928 Other abnormal and inconclusive findings on diagnostic imaging of breast: Secondary | ICD-10-CM

## 2016-05-17 DIAGNOSIS — N6001 Solitary cyst of right breast: Secondary | ICD-10-CM | POA: Diagnosis not present

## 2016-05-17 DIAGNOSIS — N63 Unspecified lump in breast: Secondary | ICD-10-CM | POA: Diagnosis present

## 2016-05-24 ENCOUNTER — Other Ambulatory Visit: Payer: Self-pay | Admitting: Obstetrics and Gynecology

## 2016-05-24 DIAGNOSIS — N6310 Unspecified lump in the right breast, unspecified quadrant: Secondary | ICD-10-CM

## 2016-06-08 ENCOUNTER — Ambulatory Visit: Payer: Managed Care, Other (non HMO)

## 2016-06-08 ENCOUNTER — Ambulatory Visit
Admission: RE | Admit: 2016-06-08 | Discharge: 2016-06-08 | Disposition: A | Discharge status: Home / Self Care | Payer: Managed Care, Other (non HMO) | Source: Ambulatory Visit | Attending: Obstetrics and Gynecology | Admitting: Obstetrics and Gynecology

## 2016-06-08 DIAGNOSIS — N6310 Unspecified lump in the right breast, unspecified quadrant: Secondary | ICD-10-CM

## 2016-06-08 DIAGNOSIS — N6311 Unspecified lump in the right breast, upper outer quadrant: Secondary | ICD-10-CM | POA: Diagnosis present

## 2016-06-08 DIAGNOSIS — N6081 Other benign mammary dysplasias of right breast: Secondary | ICD-10-CM | POA: Diagnosis not present

## 2016-06-08 HISTORY — PX: BREAST BIOPSY: SHX20

## 2016-06-09 LAB — SURGICAL PATHOLOGY

## 2016-07-29 ENCOUNTER — Encounter: Payer: Self-pay | Admitting: Internal Medicine

## 2016-07-29 ENCOUNTER — Other Ambulatory Visit: Payer: Self-pay | Admitting: Internal Medicine

## 2016-07-29 ENCOUNTER — Ambulatory Visit (INDEPENDENT_AMBULATORY_CARE_PROVIDER_SITE_OTHER): Payer: Managed Care, Other (non HMO) | Admitting: Internal Medicine

## 2016-07-29 VITALS — BP 110/80 | HR 70 | Ht 65.0 in | Wt 186.0 lb

## 2016-07-29 DIAGNOSIS — J0100 Acute maxillary sinusitis, unspecified: Secondary | ICD-10-CM | POA: Diagnosis not present

## 2016-07-29 MED ORDER — PREDNISONE 10 MG PO TABS: ORAL_TABLET | ORAL | 0 refills | Status: DC

## 2016-07-29 MED ORDER — AMOXICILLIN-POT CLAVULANATE 875-125 MG PO TABS: 1.0000 | ORAL_TABLET | Freq: Two times a day (BID) | ORAL | 0 refills | Status: DC

## 2016-07-29 MED ORDER — FLUCONAZOLE 100 MG PO TABS: 100.0000 mg | ORAL_TABLET | Freq: Every day | ORAL | 0 refills | Status: DC

## 2016-07-29 NOTE — Patient Instructions (Signed)
Continue Flonase and Advil-Sinus  Consider holding the zyrtec  No Mucinex  Dayquil/Nyquil as needed  Begin the antibiotic but only take the prednisone if the sinus pressure and pain get worse

## 2016-07-29 NOTE — Progress Notes (Signed)
Date:  07/29/2016   Name:  Tiffany Humphrey   DOB:  1971-04-21   MRN:  [id number]   Chief Complaint: Sinusitis (sore throat, pressure behind eyes, cough in am- no production) Sinusitis  This is a new problem. The current episode started in the past 7 days. The problem has been gradually worsening since onset. There has been no fever. Associated symptoms include congestion, sinus pressure and a sore throat. Pertinent negatives include no diaphoresis, ear pain, headaches, hoarse voice or shortness of breath. Past treatments include oral decongestants, acetaminophen and spray decongestants. The treatment provided no relief.      Review of Systems  Constitutional: Negative for diaphoresis and fever.  HENT: Positive for congestion, sinus pressure and sore throat. Negative for ear pain and hoarse voice.   Eyes: Negative for visual disturbance.  Respiratory: Negative for choking, shortness of breath and wheezing.   Cardiovascular: Negative for chest pain.  Neurological: Negative for dizziness, light-headedness and headaches.    Patient Active Problem List   Diagnosis Date Noted  . Headache, migraine 08/03/2015    Prior to Admission medications   Medication Sig Start Date End Date Taking? Authorizing Provider  cyclobenzaprine (FLEXERIL) 10 MG tablet Take 1 tablet (10 mg total) by mouth 3 (three) times daily as needed for muscle spasms. 02/01/16  Yes Melynda Ripple, MD  fluticasone [facility]) 50 MCG/ACT nasal spray Place 2 sprays into both nostrils daily. 08/03/15  Yes Juline Patch, MD  hydrochlorothiazide (MICROZIDE) 12.5 MG capsule Take 1 capsule by mouth daily. 01/20/15  Yes Historical Provider, MD  ibuprofen (ADVIL,MOTRIN) 800 MG tablet Take 1 tablet (800 mg total) by mouth 3 (three) times daily. 02/01/16  Yes Melynda Ripple, MD  levonorgestrel-ethinyl estradiol (SEASONALE,INTROVALE,JOLESSA) 0.15-0.03 MG tablet Take 1 tablet by mouth daily. 04/02/15  Yes Historical Provider, MD  traMADol  (ULTRAM) 50 MG tablet Take 1 tablet (50 mg total) by mouth every 6 (six) hours as needed. Dr Ouida Sills 02/01/16  Yes Melynda Ripple, MD    Allergies  Allergen Reactions  . Codeine Other (See Comments)    tachycardia  . Quinolones Hives    Past Surgical History:  Procedure Laterality Date  . BREAST BIOPSY Right 06/08/2016   path pending    Social History  Substance Use Topics  . Smoking status: Former Research scientist (life sciences)  . Smokeless tobacco: Not on file  . Alcohol use 0.0 oz/week     Comment: social     Medication list has been reviewed and updated.   Physical Exam  Constitutional: She is oriented to person, place, and time. She appears well-developed and well-nourished.  HENT:  Right Ear: External ear and ear canal normal. Tympanic membrane is retracted. Tympanic membrane is not erythematous.  Left Ear: External ear and ear canal normal. Tympanic membrane is retracted. Tympanic membrane is not erythematous.  Nose: Right sinus exhibits maxillary sinus tenderness. Right sinus exhibits no frontal sinus tenderness. Left sinus exhibits maxillary sinus tenderness. Left sinus exhibits no frontal sinus tenderness.  Mouth/Throat: Uvula is midline and mucous membranes are normal. No oral lesions. Posterior oropharyngeal erythema present. No oropharyngeal exudate.  Cardiovascular: Normal rate, regular rhythm and normal heart sounds.   Pulmonary/Chest: Breath sounds normal. She has no wheezes. She has no rales.  Lymphadenopathy:    She has no cervical adenopathy.  Neurological: She is alert and oriented to person, place, and time.    BP 110/80   Pulse 70   Ht 5\' 5"  (1.651 m)   Wt 186  lb (84.4 kg)   LMP 04/12/2016 (Approximate)   BMI 30.95 kg/m   Assessment and Plan: 1. Acute maxillary sinusitis, recurrence not specified Continue Flonase, Advil sinus, fluids Hold zyrtec - amoxicillin-clavulanate (AUGMENTIN) 875-125 MG tablet; Take 1 tablet by mouth 2 (two) times daily.  Dispense: 20  tablet; Refill: 0 - predniSONE (DELTASONE) 10 MG tablet; Take 6 on day 1, 5 on day 2, 4 on day 3, 3 on day 4, 2 on day 5 and 1 on day 1 then stop.  Dispense: 21 tablet; Refill: 0   Halina Maidens, MD [facility]  07/29/2016

## 2016-09-19 ENCOUNTER — Encounter: Payer: Self-pay | Admitting: Family Medicine

## 2016-09-19 ENCOUNTER — Ambulatory Visit (INDEPENDENT_AMBULATORY_CARE_PROVIDER_SITE_OTHER): Payer: Managed Care, Other (non HMO) | Admitting: Family Medicine

## 2016-09-19 VITALS — BP 122/80 | HR 80 | Ht 65.0 in | Wt 183.0 lb

## 2016-09-19 DIAGNOSIS — J40 Bronchitis, not specified as acute or chronic: Secondary | ICD-10-CM | POA: Diagnosis not present

## 2016-09-19 DIAGNOSIS — H8110 Benign paroxysmal vertigo, unspecified ear: Secondary | ICD-10-CM

## 2016-09-19 MED ORDER — MECLIZINE HCL 25 MG PO TABS: 25.0000 mg | ORAL_TABLET | Freq: Three times a day (TID) | ORAL | 0 refills | Status: DC | PRN

## 2016-09-19 MED ORDER — AZITHROMYCIN 250 MG PO TABS: ORAL_TABLET | ORAL | 0 refills | Status: DC

## 2016-09-19 MED ORDER — BENZONATATE 100 MG PO CAPS: 100.0000 mg | ORAL_CAPSULE | Freq: Two times a day (BID) | ORAL | 0 refills | Status: DC | PRN

## 2016-09-19 NOTE — Patient Instructions (Signed)
Labyrinthitis Introduction Labyrinthitis is an infection of the inner ear. Your inner ear is a fluid-filled system of tubes and canals (labyrinth). Nerve cells in your inner ear send signals for hearing and balance to your brain. When tiny germs (microorganisms) get inside the labyrinth, they harm the cells that send messages to the brain. Labyrinthitis can cause changes in hearing and balance. Most cases of labyrinthitis come on suddenly and they clear up within weeks. If the infection damages parts of the labyrinth, some symptoms may remain (chronic labyrinthitis). What are the causes? Viruses are the most common cause of labyrinthitis. Viruses that spread into the labyrinth are the same viruses that cause other diseases, such as:  Mononucleosis.  Measles.  Flu.  Herpes. Bacteria can also cause labyrinthitis when they spread into the labyrinth from an infection in the brain or the middle ear. Bacteria can cause:  Serous labyrinthitis. This type of labyrinthitis develops when bacteria produce a poison (toxin) that gets inside the labyrinth.  Suppurative labyrinthitis. This type of labyrinthitis develops when bacteria get inside the labyrinth. What increases the risk? You may be at greater risk for labyrinthitis if you:  Drink a lot of alcohol.  Smoke.  Take certain drugs.  Are not well rested (fatigued).  Are under a lot of stress.  Have allergies.  Recently had a nose or throat infection (upper respiratory infection) or an ear infection. What are the signs or symptoms? Symptoms of labyrinthitis usually start suddenly. The symptoms can be mild or strong and may include:  Dizziness.  Hearing loss.  A feeling that you are moving when you are not (vertigo).  Ringing in the ear (tinnitus).  Nausea and vomiting.  Trouble focusing your eyes. Symptoms of chronic labyrinthitis may include:  Fatigue.  Confusion.  Hearing loss.  Tinnitus.  Poor balance.  Vertigo  after sudden head movements. How is this diagnosed? Your health care provider may suspect labyrinthitis if you suddenly get dizzy and lose hearing, especially if you had a recent upper respiratory infection. Your health care provider will perform a physical exam to:  Check your ears for infection.  Test your balance.  Check your eye movement. Your health care provider may do several tests to rule out other causes of your symptoms and to help make a diagnosis of labyrinthitis. These may include:  Imaging studies, such as a CT scan or an MRI, to look for other causes of your symptoms.  Hearing tests.  Electronystagmography (ENG) to check your balance. How is this treated? Treatment of labyrinthitis depends on the cause. If your labyrinthitis is caused by a virus, it may get better without treatment. If your labyrinthitis is caused by bacteria, you may need medicine to fight the infection (antibiotic medicine). You may also have treatment to relieve labyrinthitis symptoms. Treatments may include:  Medicines to:  Stop dizziness.  Relieve nausea.  Treat the inflamed area.  Speed up your recovery.  Bed rest until dizziness goes away.  Fluids given through an IV tube. You may need this treatment if you have too little fluid in your body (dehydrated) from repeated nausea and vomiting. Follow these instructions at home:  Take medicines only as directed by your health care provider.  If you were prescribed an antibiotic medicine, finish all of it even if you start to feel better.  Rest as much as possible.  Avoid loud noises and bright lights.  Do not make sudden movements until any dizziness goes away.  Do not drive until your  health care provider says that you can.  Drink enough fluid to keep your urine clear or pale yellow.  Work with a physical therapist if you still feel dizzy after several weeks. A therapist can teach you exercises to help you adjust to feeling dizzy  (vestibular rehabilitation exercises).  Keep all follow-up visits as directed by your health care provider. This is important. Contact a health care provider if:  Your symptoms are not relieved by medicines.  Your symptoms last longer than two weeks.  You have a fever. Get help right away if:  You become very dizzy.  You have nausea or vomiting that does not go away.  Your hearing gets much worse very quickly. This information is not intended to replace advice given to you by your health care provider. Make sure you discuss any questions you have with your health care provider. Document Released: 09/05/2014 Document Revised: 01/21/2016 Document Reviewed: 04/16/2014  2017 [employer]vier

## 2016-09-19 NOTE — Progress Notes (Signed)
Name: Tiffany Humphrey   MRN: [id number]    DOB: [birth year]   Date:09/19/2016       Progress Note  Subjective  Chief Complaint  Chief Complaint  Patient presents with  . Ear Pain    also having cough and dizziness- taking delsym and Meclizine at home- this is the 3rd round of ear problems in 2 months    Cough  This is a recurrent problem. The current episode started yesterday. The problem has been gradually worsening. The cough is productive of sputum. Associated symptoms include chills, ear pain, a fever, headaches, nasal congestion, postnasal drip, rhinorrhea and a sore throat. Pertinent negatives include no chest pain, ear congestion, heartburn, hemoptysis, myalgias, rash, shortness of breath, sweats, weight loss or wheezing. She has tried OTC cough suppressant for the symptoms. The treatment provided no relief. There is no history of environmental allergies.  Dizziness  This is a new problem. The current episode started more than 1 month ago. The problem occurs intermittently. The problem has been waxing and waning. Associated symptoms include chills, congestion, coughing, a fever, headaches, a sore throat and vertigo. Pertinent negatives include no abdominal pain, chest pain, myalgias, nausea, neck pain, rash or vomiting. Exacerbated by: cruise.    No problem-specific Assessment & Plan notes found for this encounter.   Past Medical History:  Diagnosis Date  . Kidney disease     Past Surgical History:  Procedure Laterality Date  . BREAST BIOPSY Right 06/08/2016   path pending    Family History  Problem Relation Age of Onset  . Breast cancer Mother 81  . Breast cancer Paternal Grandmother     Social History   Social History  . Marital status: Married    Spouse name: N/A  . Number of children: N/A  . Years of education: N/A   Occupational History  . Not on file.   Social History Main Topics  . Smoking status: Former Research scientist (life sciences)  . Smokeless tobacco: Never Used  .  Alcohol use 0.0 oz/week     Comment: social  . Drug use: No  . Sexual activity: Yes   Other Topics Concern  . Not on file   Social History Narrative  . No narrative on file    Allergies  Allergen Reactions  . Codeine Other (See Comments)    tachycardia  . Quinolones Hives     Review of Systems  Constitutional: Positive for chills and fever. Negative for malaise/fatigue and weight loss.  HENT: Positive for congestion, ear pain, postnasal drip, rhinorrhea and sore throat. Negative for ear discharge.   Eyes: Negative for blurred vision.  Respiratory: Positive for cough. Negative for hemoptysis, sputum production, shortness of breath and wheezing.   Cardiovascular: Negative for chest pain, palpitations and leg swelling.  Gastrointestinal: Negative for abdominal pain, blood in stool, constipation, diarrhea, heartburn, melena, nausea and vomiting.  Genitourinary: Negative for dysuria, frequency, hematuria and urgency.  Musculoskeletal: Negative for back pain, joint pain, myalgias and neck pain.  Skin: Negative for rash.  Neurological: Positive for dizziness, vertigo and headaches. Negative for tingling, sensory change and focal weakness.  Endo/Heme/Allergies: Negative for environmental allergies and polydipsia. Does not bruise/bleed easily.  Psychiatric/Behavioral: Negative for depression and suicidal ideas. The patient is not nervous/anxious and does not have insomnia.      Objective  Vitals:   09/19/16 1030  BP: 122/80  Pulse: 80  Weight: 183 lb (83 kg)  Height: 5\' 5"  (1.651 m)    Physical Exam  Constitutional:  She is well-developed, well-nourished, and in no distress. No distress.  HENT:  Head: Normocephalic and atraumatic.  Right Ear: External ear normal.  Left Ear: External ear normal.  Nose: Nose normal.  Mouth/Throat: Oropharynx is clear and moist.  Eyes: Conjunctivae and EOM are normal. Pupils are equal, round, and reactive to light. Right eye exhibits no  discharge. Left eye exhibits no discharge.  Neck: Normal range of motion. Neck supple. No JVD present. No thyromegaly present.  Cardiovascular: Normal rate, regular rhythm, normal heart sounds and intact distal pulses.  Exam reveals no gallop and no friction rub.   No murmur heard. Pulmonary/Chest: Effort normal and breath sounds normal. She has no wheezes. She has no rales.  Abdominal: Soft. Bowel sounds are normal. She exhibits no mass. There is no tenderness. There is no guarding.  Musculoskeletal: Normal range of motion. She exhibits no edema.  Lymphadenopathy:    She has no cervical adenopathy.  Neurological: She is alert. She has normal reflexes.  Skin: Skin is warm and dry. She is not diaphoretic.  Psychiatric: Mood and affect normal.  Nursing note and vitals reviewed.     Assessment & Plan  Problem List Items Addressed This Visit    None    Visit Diagnoses    Bronchitis    -  Primary   Relevant Medications   benzonatate (TESSALON) 100 MG capsule   Benign paroxysmal positional vertigo, unspecified laterality       Relevant Medications   meclizine (ANTIVERT) 25 MG tablet   Other Relevant Orders   Ambulatory referral to ENT        Dr. Otilio Miu [facility]  09/19/16

## 2016-12-15 ENCOUNTER — Encounter: Payer: Self-pay | Admitting: *Deleted

## 2016-12-19 NOTE — Discharge Instructions (Signed)
[facility] °[facility] °ENDOSCOPIC SINUS SURGERY °[facility] ° °What is Functional Endoscopic Sinus Surgery? ° The Surgery involves making the natural openings of the sinuses larger by removing the bony partitions that separate the sinuses from the nasal cavity.  The natural sinus lining is preserved as much as possible to allow the sinuses to resume normal function after the surgery.  In some patients nasal polyps (excessively swollen lining of the sinuses) may be removed to relieve obstruction of the sinus openings.  The surgery is performed through the nose using lighted scopes, which eliminates the need for incisions on the face.  A septoplasty is a different procedure which is sometimes performed with sinus surgery.  It involves straightening the boy partition that separates the two sides of your nose.  A crooked or deviated septum may need repair if is obstructing the sinuses or nasal airflow.  Turbinate reduction is also often performed during sinus surgery.  The turbinates are bony proturberances from the side walls of the nose which swell and can obstruct the nose in patients with sinus and allergy problems.  Their size can be surgically reduced to help relieve nasal obstruction. ° °What Can Sinus Surgery Do For Me? ° Sinus surgery can reduce the frequency of sinus infections requiring antibiotic treatment.  This can provide improvement in nasal congestion, post-nasal drainage, facial pressure and nasal obstruction.  Surgery will NOT prevent you from ever having an infection again, so it usually only for patients who get infections 4 or more times yearly requiring antibiotics, or for infections that do not clear with antibiotics.  It will not cure nasal allergies, so patients with allergies may still require medication to treat their allergies after surgery. Surgery may improve headaches related to sinusitis, however, some people will continue to  require medication to control sinus headaches related to allergies.  Surgery will do nothing for other forms of headache (migraine, tension or cluster). ° °What Are the Risks of Endoscopic Sinus Surgery? ° Current techniques allow surgery to be performed safely with little risk, however, there are rare complications that patients should be aware of.  Because the sinuses are located around the eyes, there is risk of eye injury, including blindness, though again, this would be quite rare. This is usually a result of bleeding behind the eye during surgery, which puts the vision oat risk, though there are treatments to protect the vision and prevent permanent disrupted by surgery causing a leak of the spinal fluid that surrounds the brain.  More serious complications would include bleeding inside the brain cavity or damage to the brain.  Again, all of these complications are uncommon, and spinal fluid leaks can be safely managed surgically if they occur.  The most common complication of sinus surgery is bleeding from the nose, which may require packing or cauterization of the nose.  Continued sinus have polyps may experience recurrence of the polyps requiring revision surgery.  Alterations of sense of smell or injury to the tear ducts are also rare complications.  ° °What is the Surgery Like, and what is the Recovery? ° The Surgery usually takes a couple of hours to perform, and is usually performed under a general anesthetic (completely asleep).  Patients are usually discharged home after a couple of hours.  Sometimes during surgery it is necessary to pack the nose to control bleeding, and the packing is left in place for 24 - 48 hours, and removed by your surgeon.    If a septoplasty was performed during the procedure, there is often a splint placed which must be removed after 5-7 days.   °Discomfort: Pain is usually mild to moderate, and can be controlled by prescription pain medication or acetaminophen (Tylenol).   Aspirin, Ibuprofen (Advil, Motrin), or Naprosyn (Aleve) should be avoided, as they can cause increased bleeding.  Most patients feel sinus pressure like they have a bad head cold for several days.  Sleeping with your head elevated can help reduce swelling and facial pressure, as can ice packs over the face.  A humidifier may be helpful to keep the mucous and blood from drying in the nose.  ° °Diet: There are no specific diet restrictions, however, you should generally start with clear liquids and a light diet of bland foods because the anesthetic can cause some nausea.  Advance your diet depending on how your stomach feels.  Taking your pain medication with food will often help reduce stomach upset which pain medications can cause. ° °Nasal Saline Irrigation: It is important to remove blood clots and dried mucous from the nose as it is healing.  This is done by having you irrigate the nose at least 3 - 4 times daily with a salt water solution.  We recommend using NeilMed Sinus Rinse (available at the drug store).  Fill the squeeze bottle with the solution, bend over a sink, and insert the tip of the squeeze bottle into the nose ½ of an inch.  Point the tip of the squeeze bottle towards the inside corner of the eye on the same side your irrigating.  Squeeze the bottle and gently irrigate the nose.  If you bend forward as you do this, most of the fluid will flow back out of the nose, instead of down your throat.   The solution should be warm, near body temperature, when you irrigate.   Each time you irrigate, you should use a full squeeze bottle.  ° °Note that if you are instructed to use Nasal Steroid Sprays at any time after your surgery, irrigate with saline BEFORE using the steroid spray, so you do not wash it all out of the nose. °Another product, Nasal Saline Gel (such as AYR Nasal Saline Gel) can be applied in each nostril 3 - 4 times daily to moisture the nose and reduce scabbing or crusting. ° °Bleeding:   Bloody drainage from the nose can be expected for several days, and patients are instructed to irrigate their nose frequently with salt water to help remove mucous and blood clots.  The drainage may be dark red or brown, though some fresh blood may be seen intermittently, especially after irrigation.  Do not blow you nose, as bleeding may occur. If you must sneeze, keep your mouth open to allow air to escape through your mouth. ° °If heavy bleeding occurs: Irrigate the nose with saline to rinse out clots, then spray the nose 3 - 4 times with Afrin Nasal Decongestant Spray.  The spray will constrict the blood vessels to slow bleeding.  Pinch the lower half of your nose shut to apply pressure, and lay down with your head elevated.  Ice packs over the nose may help as well. If bleeding persists despite these measures, you should notify your doctor.  Do not use the Afrin routinely to control nasal congestion after surgery, as it can result in worsening congestion and may affect healing.  ° °Activity: Return to work varies among patients. Most patients will be out of   work at least 5 - 7 days to recover.  Patient may return to work after they are off of narcotic pain medication, and feeling well enough to perform the functions of their job.  Patients must avoid heavy lifting (over 10 pounds) or strenuous physical for 2 weeks after surgery, so your employer may need to assign you to light duty, or keep you out of work longer if light duty is not possible.  NOTE: you should not drive, operate dangerous machinery, do any mentally demanding tasks or make any important legal or financial decisions while on narcotic pain medication and recovering from the general anesthetic.  °  °Call Your Doctor Immediately if You Have Any of the Following: °1. Bleeding that you cannot control with the above measures °2. Loss of vision, double vision, bulging of the eye or black eyes. °3. Fever over 101 degrees °4. Neck stiffness with severe  headache, fever, nausea and change in mental state. °You are always encourage to call anytime with concerns, however, please call with requests for pain medication refills during office hours. ° °Office Endoscopy: During follow-up visits your doctor will remove any packing or splints that may have been placed and evaluate and clean your sinuses endoscopically.  Topical anesthetic will be used to make this as comfortable as possible, though you may want to take your pain medication prior to the visit.  How often this will need to be done varies from patient to patient.  After complete recovery from the surgery, you may need follow-up endoscopy from time to time, particularly if there is concern of recurrent infection or nasal polyps. ° ° °General Anesthesia, Adult, Care After °These instructions provide you with information about caring for yourself after your procedure. Your health care provider may also give you more specific instructions. Your treatment has been planned according to current medical practices, but problems sometimes occur. Call your health care provider if you have any problems or questions after your procedure. °What can I expect after the procedure? °After the procedure, it is common to have: °· Vomiting. °· A sore throat. °· Mental slowness. °It is common to feel: °· Nauseous. °· Cold or shivery. °· Sleepy. °· Tired. °· Sore or achy, even in parts of your body where you did not have surgery. °Follow these instructions at home: °For at least 24 hours after the procedure: °· Do not: °¨ Participate in activities where you could fall or become injured. °¨ Drive. °¨ Use heavy machinery. °¨ Drink alcohol. °¨ Take sleeping pills or medicines that cause drowsiness. °¨ Make important decisions or sign legal documents. °¨ Take care of children on your own. °· Rest. °Eating and drinking °· If you vomit, drink water, juice, or soup when you can drink without vomiting. °· Drink enough fluid to keep your  urine clear or pale yellow. °· Make sure you have little or no nausea before eating solid foods. °· Follow the diet recommended by your health care provider. °General instructions °· Have a responsible adult stay with you until you are awake and alert. °· Return to your normal activities as told by your health care provider. Ask your health care provider what activities are safe for you. °· Take over-the-counter and prescription medicines only as told by your health care provider. °· If you smoke, do not smoke without supervision. °· Keep all follow-up visits as told by your health care provider. This is important. °Contact a health care provider if: °· You continue to have nausea   or vomiting at home, and medicines are not helpful. °· You cannot drink fluids or start eating again. °· You cannot urinate after 8-12 hours. °· You develop a skin rash. °· You have fever. °· You have increasing redness at the site of your procedure. °Get help right away if: °· You have difficulty breathing. °· You have chest pain. °· You have unexpected bleeding. °· You feel that you are having a life-threatening or urgent problem. °This information is not intended to replace advice given to you by your health care provider. Make sure you discuss any questions you have with your health care provider. °Document Released: 11/21/2000 Document Revised: 01/18/2016 Document Reviewed: 07/30/2015 °Elsevier Interactive Patient Education © 2017 [employer] ° °

## 2016-12-22 ENCOUNTER — Encounter
Admission: RE | Disposition: A | Discharge status: Home / Self Care | Payer: Self-pay | Source: Ambulatory Visit | Attending: Otolaryngology

## 2016-12-22 ENCOUNTER — Ambulatory Visit
Admission: RE | Admit: 2016-12-22 | Discharge: 2016-12-22 | Disposition: A | Discharge status: Home / Self Care | Payer: Managed Care, Other (non HMO) | Source: Ambulatory Visit | Attending: Otolaryngology | Admitting: Otolaryngology

## 2016-12-22 ENCOUNTER — Ambulatory Visit: Payer: Managed Care, Other (non HMO) | Admitting: Anesthesiology

## 2016-12-22 DIAGNOSIS — J320 Chronic maxillary sinusitis: Secondary | ICD-10-CM | POA: Diagnosis not present

## 2016-12-22 DIAGNOSIS — J321 Chronic frontal sinusitis: Secondary | ICD-10-CM | POA: Insufficient documentation

## 2016-12-22 DIAGNOSIS — J342 Deviated nasal septum: Secondary | ICD-10-CM | POA: Insufficient documentation

## 2016-12-22 DIAGNOSIS — J343 Hypertrophy of nasal turbinates: Secondary | ICD-10-CM | POA: Insufficient documentation

## 2016-12-22 DIAGNOSIS — Z793 Long term (current) use of hormonal contraceptives: Secondary | ICD-10-CM | POA: Diagnosis not present

## 2016-12-22 DIAGNOSIS — Z79899 Other long term (current) drug therapy: Secondary | ICD-10-CM | POA: Insufficient documentation

## 2016-12-22 DIAGNOSIS — Z87891 Personal history of nicotine dependence: Secondary | ICD-10-CM | POA: Insufficient documentation

## 2016-12-22 HISTORY — PX: SEPTOPLASTY: SHX2393

## 2016-12-22 HISTORY — DX: Dorsopathy, unspecified: M53.9

## 2016-12-22 HISTORY — DX: Headache, unspecified: R51.9

## 2016-12-22 HISTORY — DX: Presence of spectacles and contact lenses: Z97.3

## 2016-12-22 HISTORY — DX: Gastro-esophageal reflux disease without esophagitis: K21.9

## 2016-12-22 HISTORY — PX: IMAGE GUIDED SINUS SURGERY: SHX6570

## 2016-12-22 HISTORY — DX: Dizziness and giddiness: R42

## 2016-12-22 HISTORY — PX: MAXILLARY ANTROSTOMY: SHX2003

## 2016-12-22 HISTORY — PX: FRONTAL SINUS EXPLORATION: SHX6591

## 2016-12-22 HISTORY — PX: TURBINATE REDUCTION: SHX6157

## 2016-12-22 HISTORY — DX: Headache: R51

## 2016-12-22 SURGERY — SEPTOPLASTY, NOSE
Anesthesia: General | Site: Nose | Wound class: Clean Contaminated

## 2016-12-22 MED ORDER — CEFAZOLIN SODIUM-DEXTROSE 2-4 GM/100ML-% IV SOLN
2.0000 g | Freq: Once | INTRAVENOUS | Status: AC
  Administered 2016-12-22: 2 g via INTRAVENOUS

## 2016-12-22 MED ORDER — TRAMADOL HCL 50 MG PO TABS
50.0000 mg | ORAL_TABLET | Freq: Once | ORAL | Status: AC
  Administered 2016-12-22: 50 mg via ORAL

## 2016-12-22 MED ORDER — LIDOCAINE-EPINEPHRINE 1 %-1:100000 IJ SOLN
INTRAMUSCULAR | Status: DC | PRN
  Administered 2016-12-22: 6 mL

## 2016-12-22 MED ORDER — DEXAMETHASONE SODIUM PHOSPHATE 4 MG/ML IJ SOLN
INTRAMUSCULAR | Status: DC | PRN
  Administered 2016-12-22: 10 mg via INTRAVENOUS

## 2016-12-22 MED ORDER — FENTANYL CITRATE (PF) 100 MCG/2ML IJ SOLN
INTRAMUSCULAR | Status: DC | PRN
  Administered 2016-12-22: 100 ug via INTRAVENOUS

## 2016-12-22 MED ORDER — OXYMETAZOLINE HCL 0.05 % NA SOLN
2.0000 | Freq: Once | NASAL | Status: AC
  Administered 2016-12-22: 2 via NASAL

## 2016-12-22 MED ORDER — FENTANYL CITRATE (PF) 100 MCG/2ML IJ SOLN
25.0000 ug | INTRAMUSCULAR | Status: AC | PRN
  Administered 2016-12-22 (×4): 25 ug via INTRAVENOUS

## 2016-12-22 MED ORDER — LIDOCAINE HCL (CARDIAC) 20 MG/ML IV SOLN
INTRAVENOUS | Status: DC | PRN
  Administered 2016-12-22: 50 mg via INTRAVENOUS

## 2016-12-22 MED ORDER — SCOPOLAMINE 1 MG/3DAYS TD PT72
1.0000 | MEDICATED_PATCH | Freq: Once | TRANSDERMAL | Status: DC
  Administered 2016-12-22: 1.5 mg via TRANSDERMAL

## 2016-12-22 MED ORDER — PROPOFOL 10 MG/ML IV BOLUS
INTRAVENOUS | Status: DC | PRN
  Administered 2016-12-22: 150 mg via INTRAVENOUS

## 2016-12-22 MED ORDER — ONDANSETRON HCL 4 MG/2ML IJ SOLN
4.0000 mg | Freq: Once | INTRAMUSCULAR | Status: DC | PRN
Start: 2016-12-22 — End: 2016-12-22

## 2016-12-22 MED ORDER — LACTATED RINGERS IV SOLN
INTRAVENOUS | Status: DC
  Administered 2016-12-22: 09:00:00 via INTRAVENOUS

## 2016-12-22 MED ORDER — LIDOCAINE HCL 1 % IJ SOLN
INTRAMUSCULAR | Status: DC | PRN
  Administered 2016-12-22: 6 mL via TOPICAL

## 2016-12-22 MED ORDER — ONDANSETRON HCL 4 MG/2ML IJ SOLN
INTRAMUSCULAR | Status: DC | PRN
  Administered 2016-12-22: 4 mg via INTRAVENOUS

## 2016-12-22 MED ORDER — MIDAZOLAM HCL 5 MG/5ML IJ SOLN
INTRAMUSCULAR | Status: DC | PRN
  Administered 2016-12-22: 2 mg via INTRAVENOUS

## 2016-12-22 MED ORDER — ROCURONIUM BROMIDE 100 MG/10ML IV SOLN
INTRAVENOUS | Status: DC | PRN
  Administered 2016-12-22: 30 mg via INTRAVENOUS

## 2016-12-22 MED ORDER — ACETAMINOPHEN 10 MG/ML IV SOLN
1000.0000 mg | Freq: Once | INTRAVENOUS | Status: AC
  Administered 2016-12-22: 1000 mg via INTRAVENOUS

## 2016-12-22 SURGICAL SUPPLY — 36 items
BALLOON SINUPLASTY SYSTEM (BALLOONS) ×4 IMPLANT
BATTERY INSTRU NAVIGATION (MISCELLANEOUS) ×16 IMPLANT
CANISTER SUCT 1200ML W/VALVE (MISCELLANEOUS) ×4 IMPLANT
CATH IV 18X1 1/4 SAFELET (CATHETERS) ×4 IMPLANT
COAGULATOR SUCT 8FR VV (MISCELLANEOUS) ×4 IMPLANT
DEVICE INFLATION SEID (MISCELLANEOUS) ×4 IMPLANT
DRAPE HEAD BAR (DRAPES) ×4 IMPLANT
GLOVE PI ULTRA LF STRL 7.5 (GLOVE) ×4 IMPLANT
GLOVE PI ULTRA NON LATEX 7.5 (GLOVE) ×4
IV CATH 18X1 1/4 SAFELET (CATHETERS) ×2
IV NS 500ML (IV SOLUTION) ×2
IV NS 500ML BAXH (IV SOLUTION) ×2 IMPLANT
KIT ROOM TURNOVER OR (KITS) ×4 IMPLANT
NAVIGATION MASK REG  ST (MISCELLANEOUS) ×4 IMPLANT
NEEDLE ANESTHESIA  27G X 3.5 (NEEDLE) ×2
NEEDLE ANESTHESIA 27G X 3.5 (NEEDLE) ×2 IMPLANT
NEEDLE SPNL 25GX3.5 QUINCKE BL (NEEDLE) ×4 IMPLANT
NS IRRIG 500ML POUR BTL (IV SOLUTION) ×4 IMPLANT
PACK DRAPE NASAL/ENT (PACKS) ×4 IMPLANT
PACKING NASAL EPIS 4X2.4 XEROG (MISCELLANEOUS) ×4 IMPLANT
PAD GROUND ADULT SPLIT (MISCELLANEOUS) ×4 IMPLANT
PATTIES SURGICAL .5 X3 (DISPOSABLE) ×4 IMPLANT
SHAVER DIEGO BLD STD TYPE A (BLADE) ×4 IMPLANT
SOL ANTI-FOG 6CC FOG-OUT (MISCELLANEOUS) ×2 IMPLANT
SOL FOG-OUT ANTI-FOG 6CC (MISCELLANEOUS) ×2
SPLINT NASAL SEPTAL BLV .50 ST (MISCELLANEOUS) ×4 IMPLANT
STRAP BODY AND KNEE 60X3 (MISCELLANEOUS) ×4 IMPLANT
SUT CHROMIC 3-0 (SUTURE)
SUT CHROMIC 3-0 KS 27XMFL CR (SUTURE)
SUT ETHILON 3-0 KS 30 BLK (SUTURE) ×4 IMPLANT
SUT PLAIN GUT 4-0 (SUTURE) ×4 IMPLANT
SUTURE CHRMC 3-0 KS 27XMFL CR (SUTURE) IMPLANT
SYR 3ML LL SCALE MARK (SYRINGE) ×4 IMPLANT
TOWEL OR 17X26 4PK STRL BLUE (TOWEL DISPOSABLE) ×4 IMPLANT
TUBING DECLOG MULTIDEBRIDER (TUBING) IMPLANT
WATER STERILE IRR 250ML POUR (IV SOLUTION) ×4 IMPLANT

## 2016-12-22 NOTE — Op Note (Signed)
12/22/2016  11:00 AM  [id number]   Pre-Op Dx:  Deviated Nasal Septum, chronic bilateral maxillary sinusitis, chronic bilateral frontal sinusitis, Hypertrophic Inferior Turbinates  Post-op Dx: Same  Proc: Nasal Septoplasty, bilateral endoscopic frontal sinusotomies, bilateral endoscopic maxillary antrostomies, Bilateral Partial Reduction Inferior Turbinates, use of image guided system  Surg:  Anival Pasha H  Anes:  GOT  EBL:  50 mL  Comp:  None  Findings: Septum was deviated to the left side. There were Boney spurs off both sides maxillary crest.  Procedure: With the patient in a comfortable supine position,  general orotracheal anesthesia was induced without difficulty.     The patient received preoperative Afrin spray for topical decongestion and vasoconstriction.  Intravenous prophylactic antibiotics were administered. The image guided system was brought in the CT scan was downloaded from the disc. The template was applied to the face and was registered then to the system. There is 0.6 mm of variance. The suction instruments were registered the system as well and there was good alignment frontal nose but was off by about 6 mm in the back of the nose. The template was then reregistered this time there was perfect alignment with the suction instruments.  At an appropriate level, the patient was placed in a semi-sitting position.  Nasal vibrissae were trimmed.   1% Xylocaine with 1:100,000 epinephrine, 10 cc's, was infiltrated into the anterior floor of the nose, into the nasal spine region, into the membranous columella, and finally into the submucoperichondrial plane of the septum on both sides.  Several minutes were allowed for this to take effect.  Cottoniod pledgetts soaked in Afrin and 4% Xylocaine were placed into both nasal cavities and left while the patient was prepped and draped in the standard fashion.  The materials were removed from the nose and observed to be intact and  correct in number.  The nose was inspected with a headlight and the 0 scope with the findings as described above.  A left Killian incision was sharply executed and carried down to the quadrangular cartilage. The mucoperichondrium was elelvated along the quadrangular plate back to the bony-cartilaginous junction. The mucoperiostium was then elevated along the ethmoid plate and the vomer. The boney-catilaginous junction was then split with a freer elevator and the mucoperiosteum was elevated on the opposite side. The mucoperiosteum was then elevated along the maxillary crest as needed to expose the crooked bone of the crest.  Boney spurs of the vomer and maxillary crest were removed with Takahashi forceps and with a small straight chisel..  The cartilaginous plate was trimmed along its posterior and inferior borders of about 2 mm of cartilage to free it up inferiorly. Some of the deviated ethmoid plate was then fractured and removed with Takahashi forceps to free up the posterior border of the quadrangular plate and allow it to swing back to the midline. The mucosal flaps were placed back into their anatomic position to allow visualization of the airways. The septum now sat in the midline with an improved airway.  A 3-0 Chromic suture on a Keith needle in used to anchor the inferior septum at the nasal spine with a through and through suture. The mucosal flaps are then sutured together using a through and through whip stitch of 4-0 Plain Gut with a mini-Keith needle. This was used to close the [place] incision as well.   The inferior turbinates were then inspected. An incision was created along the inferior aspect of the left inferior turbinate with removal of some  of the inferior soft tissue and bone. Electrocautery was used to control bleeding in the area. The remaining turbinate was then outfractured to open up the airway further. There was no significant bleeding noted. The right turbinate was then  trimmed and outfractured in a similar fashion.  The 0 scope was then used to visualize the left side. The middle turbinate was infractured visualize middle meatus better. The Acclarent balloon system was then used to cannulize the frontal sinus duct. The light was seen in the frontal sinus and the balloon was passed over the wire. This was dilated to 12 cm of pressure several times to open up the frontal sinus duct. Small through biting forceps were used to trim the mucosal edges around the opening and remove bone shards that were partially blocking the opening. Once this was cleaned see into the frontal sinus duct well. The left maxillary sinus was then cannulize with the balloon system and again the light was visible in the maxillary sinuses. The balloon was threaded over this and used to dilate the maxillary antral opening. Again small pieces of bone shards and mucosa from the uncinate process were trimmed and removed to make sure the opening remained large.  This procedure was now repeated on the right side with infracturing the middle turbinate and then using the balloon system to cannulize and dilate frontal sinus duct. There was bone shards and mucosa at the opening that were removed make sure this healed well. There is no significant bleeding here. The balloon system was used to cannulize the right maxillary sinus duct and then the balloon was passed over it and dilated 12 cm of pressure. The uncinate process was trimmed some to make sure the opening remained widely patent to promote drainage from the sinus. A small pieces xerogel was then placed in the middle meatus on both sides to help prevent clotting and scarring in these areas.  The airways were then visualized and showed open passageways on both sides that were significantly improved compared to before surgery. There was no signifcant bleeding. Nasal splints were applied to both sides of the septum using Xomed 0.15mm regular sized splints that  were trimmed, and then held in position with a 3-0 Nylon through and through suture.  The patient was turned back over to anesthesia, and awakened, extubated, and taken to the PACU in satisfactory condition.  Dispo:   PACU to home  Plan: Ice, elevation, narcotic analgesia, steroid taper, and prophylactic antibiotics for the duration of indwelling nasal foreign bodies.  We will reevaluate the patient in the office in 6 days and remove the septal splints.  Return to work in 10 days, strenuous activities in two weeks.   Amesha Bailey H 12/22/2016 11:00 AM

## 2016-12-22 NOTE — H&P (Signed)
  H&P has been reviewed and the patient reexamined, and no changes necessary. To be downloaded later.

## 2016-12-22 NOTE — Transfer of Care (Signed)
Immediate Anesthesia Transfer of Care Note  Patient: Tiffany Humphrey  Procedure(s) Performed: Procedure(s) with comments: SEPTOPLASTY (N/A) - GAVE DISK TO CECE 4-5 KP TURBINATE REDUCTION partial inferior (N/A) MAXILLARY ANTROSTOMY (Bilateral) FRONTAL SINUS EXPLORATION (Bilateral) IMAGE GUIDED SINUS SURGERY (N/A)  Patient Location: PACU  Anesthesia Type: General ETT  Level of Consciousness: awake, alert  and patient cooperative  Airway and Oxygen Therapy: Patient Spontanous Breathing and Patient connected to supplemental oxygen  Post-op Assessment: Post-op Vital signs reviewed, Patient's Cardiovascular Status Stable, Respiratory Function Stable, Patent Airway and No signs of Nausea or vomiting  Post-op Vital Signs: Reviewed and stable  Complications: No apparent anesthesia complications

## 2016-12-22 NOTE — Anesthesia Procedure Notes (Signed)
Procedure Name: Intubation Date/Time: 12/22/2016 9:34 AM Performed by: Londell Moh Pre-anesthesia Checklist: Patient identified, Emergency Drugs available, Suction available, Patient being monitored and Timeout performed Patient Re-evaluated:Patient Re-evaluated prior to inductionOxygen Delivery Method: Circle system utilized Preoxygenation: Pre-oxygenation with 100% oxygen Intubation Type: IV induction Ventilation: Mask ventilation without difficulty Laryngoscope Size: Mac and 3 Grade View: Grade I Tube type: Oral Rae Tube size: 7.0 mm Number of attempts: 1 Placement Confirmation: ETT inserted through vocal cords under direct vision,  positive ETCO2 and breath sounds checked- equal and bilateral Tube secured with: Tape Dental Injury: Teeth and Oropharynx as per pre-operative assessment

## 2016-12-22 NOTE — Anesthesia Preprocedure Evaluation (Signed)
Anesthesia Evaluation  Patient identified by MRN, date of birth, ID band Patient awake    Reviewed: Allergy & Precautions, H&P , NPO status , Patient's Chart, lab work & pertinent test results  Airway Mallampati: II  TM Distance: >3 FB Neck ROM: full    Dental no notable dental hx.    Pulmonary former smoker,    Pulmonary exam normal        Cardiovascular Normal cardiovascular exam     Neuro/Psych    GI/Hepatic GERD  ,  Endo/Other    Renal/GU      Musculoskeletal   Abdominal   Peds  Hematology   Anesthesia Other Findings   Reproductive/Obstetrics                            Anesthesia Physical Anesthesia Plan  ASA: II  Anesthesia Plan: General ETT   Post-op Pain Management:    Induction:   Airway Management Planned:   Additional Equipment:   Intra-op Plan:   Post-operative Plan:   Informed Consent: I have reviewed the patients History and Physical, chart, labs and discussed the procedure including the risks, benefits and alternatives for the proposed anesthesia with the patient or authorized representative who has indicated his/her understanding and acceptance.     Plan Discussed with:   Anesthesia Plan Comments:        Anesthesia Quick Evaluation

## 2016-12-22 NOTE — Anesthesia Postprocedure Evaluation (Signed)
Anesthesia Post Note  Patient: Tiffany Humphrey  Procedure(s) Performed: Procedure(s) (LRB): SEPTOPLASTY (N/A) TURBINATE REDUCTION partial inferior (N/A) MAXILLARY ANTROSTOMY (Bilateral) FRONTAL SINUS EXPLORATION (Bilateral) IMAGE GUIDED SINUS SURGERY (N/A)  Patient location during evaluation: PACU Anesthesia Type: General Level of consciousness: awake and alert and oriented Pain management: satisfactory to patient Vital Signs Assessment: post-procedure vital signs reviewed and stable Respiratory status: spontaneous breathing, nonlabored ventilation and respiratory function stable Cardiovascular status: blood pressure returned to baseline and stable Postop Assessment: Adequate PO intake and No signs of nausea or vomiting Anesthetic complications: no    Raliegh Ip

## 2016-12-23 ENCOUNTER — Encounter: Payer: Self-pay | Admitting: Otolaryngology

## 2016-12-27 LAB — SURGICAL PATHOLOGY

## 2017-02-21 ENCOUNTER — Other Ambulatory Visit: Payer: Self-pay | Admitting: Obstetrics and Gynecology

## 2017-02-21 DIAGNOSIS — Z1231 Encounter for screening mammogram for malignant neoplasm of breast: Secondary | ICD-10-CM

## 2017-05-02 ENCOUNTER — Ambulatory Visit
Admission: RE | Admit: 2017-05-02 | Discharge: 2017-05-02 | Disposition: A | Discharge status: Home / Self Care | Payer: Managed Care, Other (non HMO) | Source: Ambulatory Visit | Attending: Family Medicine | Admitting: Family Medicine

## 2017-05-02 ENCOUNTER — Encounter: Payer: Self-pay | Admitting: Family Medicine

## 2017-05-02 ENCOUNTER — Ambulatory Visit (INDEPENDENT_AMBULATORY_CARE_PROVIDER_SITE_OTHER): Payer: Managed Care, Other (non HMO) | Admitting: Family Medicine

## 2017-05-02 VITALS — BP 130/92 | HR 88 | Ht 65.0 in | Wt 187.0 lb

## 2017-05-02 DIAGNOSIS — M5412 Radiculopathy, cervical region: Secondary | ICD-10-CM

## 2017-05-02 DIAGNOSIS — M4692 Unspecified inflammatory spondylopathy, cervical region: Secondary | ICD-10-CM | POA: Diagnosis not present

## 2017-05-02 DIAGNOSIS — M5090 Cervical disc disorder, unspecified, unspecified cervical region: Secondary | ICD-10-CM

## 2017-05-02 MED ORDER — PREDNISONE 10 MG PO TABS: 10.0000 mg | ORAL_TABLET | Freq: Every day | ORAL | 0 refills | Status: DC

## 2017-05-02 MED ORDER — MELOXICAM 15 MG PO TABS: 15.0000 mg | ORAL_TABLET | Freq: Every day | ORAL | 0 refills | Status: DC

## 2017-05-02 NOTE — Patient Instructions (Signed)
Herniated Disk A herniated disk, also called a ruptured disk or slipped disk, occurs when a disk in the spine bulges out too far. Between the bones in the spine (vertebrae), there are oval disks that are made of a soft, spongy center that is surrounded by a tough outer ring. The disks connect your vertebrae, help your spine move, and absorb shocks from your movement. When you have a herniated disk, the spongy center of the disk bulges out or breaks through the outer ring. It can press on a nerve between the vertebrae and cause pain. This can occur anywhere in the back or neck area, but the lower back is most commonly affected. What are the causes? This condition may be caused by:  Age-related wear and tear. The spongy centers of spinal disks tend to shrink and dry out with age, which makes them more likely to herniate.  Sudden injury, such as a strain or sprain.  What increases the risk? Aging is the main risk factor for a herniated disk. Other risk factors include:  Being a man who is 30-50 years old.  Frequently doing activities that involve heavy lifting, bending, or twisting.  Frequently driving for long hours at a time.  Not getting enough exercise.  Being overweight.  Smoking.  Having a family history of back problems or herniated disks.  Being pregnant or giving birth.  Having poor nutrition.  Being tall.  What are the signs or symptoms? Symptoms may vary depending on where your herniated disk is located.  A herniated disk in the lower back may cause sharp pain in: ? Part of the arm, leg, hip, or buttocks. ? The back of the lower leg (calf). ? The lower back, spreading down through the leg into the foot (sciatica).  A herniated disk in the neck may cause dizziness and vertigo. It may also cause pain or weakness in: ? The neck. ? The shoulder blades. ? Upper arm, forearm, or fingers.  You may also have muscle weakness. It may be difficult to: ? Lift your leg or  arm. ? Stand on your toes. ? Squeeze tightly with one of your hands.  Other symptoms may include: ? Numbness or tingling in the affected areas of the hands, arms, feet, or legs. ? Inability to control when you urinate or when you have bowel movements. This is a rare but serious sign of a severe herniated disk in the lower back.  How is this diagnosed? This condition may be diagnosed based on:  Your symptoms.  Your medical history.  A physical exam. The exam may include: ? Straight-leg test. You will lie on your back while your health care provider lifts your leg, keeping your knee straight. If you feel pain, you likely have a herniated disk. ? Neurological tests. This includes checking for numbness, reflexes, muscle strength, and posture.  Imaging tests, such as: ? X-rays. ? MRI. ? CT scan. ? Electromyogram (EMG) to check the nerves that control muscles. This test may be used to determine which nerves are affected by your herniated disk.  How is this treated? Treatment for this condition may include:  A short period of rest. This is usually the first treatment. ? You may be on bed rest for up to 2 days, or you may be instructed to stay home and avoid physical activity. ? If you have a herniated disk in your lower back, avoid sitting as much as possible. Sitting increases pressure on the disk.  Medicines. These may   include: ? NSAIDs to help reduce pain and swelling. ? Muscle relaxants to prevent sudden tightening of the back muscles (back spasms). ? Prescription pain medicines, if you have severe pain.  Steroid injections in the area of the herniated disk. This can help reduce pain and swelling.  Physical therapy to strengthen your back muscles.  In many cases, symptoms go away with treatment over a period of days or weeks. You will most likely be free of symptoms after 3-4 months. If other treatments do not help to relieve your symptoms, you may need surgery. Follow these  instructions at home: Medicines  Take over-the-counter and prescription medicines only as told by your health care provider.  Do not drive or use heavy machinery while taking prescription pain medicine. Activity  Rest as directed.  After your rest period: ? Return to your normal activities and gradually begin exercising as told by your health care provider. Ask your health care provider what activities and exercises are safe for you. ? Use good posture. ? Avoid movements that cause pain. ? Do not lift anything that is heavier than 10 lb (4.5 kg) until your health care provider says this is safe. ? Do not sit or stand for long periods of time without changing positions. ? Do not sit for long periods of time without getting up and moving around.  If physical therapy was prescribed, do exercises as instructed.  Aim to strengthen muscles in your back and abdomen with exercises like crunches, swimming, or walking. General instructions  Do not use any products that contain nicotine or tobacco, such as cigarettes and e-cigarettes. These products can delay healing. If you need help quitting, ask your health care provider.  Do not wear high-heeled shoes.  Do not sleep on your belly.  If you are overweight, work with your health care provider to lose weight safely.  To prevent or treat constipation while you are taking prescription pain medicine, your health care provider may recommend that you: ? Drink enough fluid to keep your urine clear or pale yellow. ? Take over-the-counter or prescription medicines. ? Eat foods that are high in fiber, such as fresh fruits and vegetables, whole grains, and beans. ? Limit foods that are high in fat and processed sugars, such as fried and sweet foods.  Keep all follow-up visits as told by your health care provider. This is important. How is this prevented?  Maintain a healthy weight.  Try to avoid stressful situations.  Maintain physical  fitness. Do at least 150 minutes of moderate-intensity exercise each week, such as brisk walking or water aerobics.  When lifting objects: ? Keep your feet at least shoulder-width apart and tighten your abdominal muscles. ? Keep your spine neutral as you bend your knees and hips. It is important to lift using the strength of your legs, not your back. Do not lock your knees straight out. ? Always ask for help to lift heavy or awkward objects. Contact a health care provider if:  You have back pain or neck pain that does not get better after 6 weeks.  You have severe pain in your back, neck, legs, or arms.  You develop numbness, tingling, or weakness in any part of your body. Get help right away if:  You cannot move your arms or legs.  You cannot control when you urinate or have bowel movements.  You feel dizzy or you faint.  You have shortness of breath. This information is not intended to replace advice given   to you by your health care provider. Make sure you discuss any questions you have with your health care provider. Document Released: 08/12/2000 Document Revised: 04/11/2016 Document Reviewed: 04/11/2016 Elsevier Interactive Patient Education  2017 [employer]  

## 2017-05-02 NOTE — Progress Notes (Signed)
Name: Tiffany Humphrey   MRN: [id number]    DOB: [birth year]   Date:05/02/2017       Progress Note  Subjective  Chief Complaint  Chief Complaint  Patient presents with  . Shoulder Pain    L) shoulder pain- was seen for it last year and it never really got better- "when it acts up it causes finger to draw"- last night lost feeling in thumb/ tingling in arm    Neck Pain   This is a chronic problem. The current episode started more than 1 year ago. The problem occurs constantly. The problem has been waxing and waning. Associated with: position of neck. The pain is present in the left side. The quality of the pain is described as aching. The pain is at a severity of 6/10. The pain is moderate. The symptoms are aggravated by bending. The pain is same all the time. Associated symptoms include numbness, paresis, tingling and weakness. Pertinent negatives include no chest pain, fever, headaches or weight loss. She has tried NSAIDs, acetaminophen and heat for the symptoms. The treatment provided mild relief.    No problem-specific Assessment & Plan notes found for this encounter.   Past Medical History:  Diagnosis Date  . GERD (gastroesophageal reflux disease)   . Headache    migraines/stress, optical migraines  . Kidney disease   . Multilevel degenerative disc disease   . Vertigo    last episode 10/2016  . Wears contact lenses     Past Surgical History:  Procedure Laterality Date  . BREAST BIOPSY Right 06/08/2016   path pending (under local)  . FRONTAL SINUS EXPLORATION Bilateral 12/22/2016   Procedure: FRONTAL SINUS EXPLORATION;  Surgeon: Margaretha Sheffield, MD;  Location: [facility];  Service: ENT;  Laterality: Bilateral;  . IMAGE GUIDED SINUS SURGERY N/A 12/22/2016   Procedure: IMAGE GUIDED SINUS SURGERY;  Surgeon: Margaretha Sheffield, MD;  Location: [facility];  Service: ENT;  Laterality: N/A;  . MAXILLARY ANTROSTOMY Bilateral 12/22/2016   Procedure: MAXILLARY ANTROSTOMY;  Surgeon:  Margaretha Sheffield, MD;  Location: [facility];  Service: ENT;  Laterality: Bilateral;  . SEPTOPLASTY N/A 12/22/2016   Procedure: SEPTOPLASTY;  Surgeon: Margaretha Sheffield, MD;  Location: [facility];  Service: ENT;  Laterality: N/A;  GAVE DISK TO [facility] 4-5 KP  . TURBINATE REDUCTION N/A 12/22/2016   Procedure: TURBINATE REDUCTION partial inferior;  Surgeon: Margaretha Sheffield, MD;  Location: [facility];  Service: ENT;  Laterality: N/A;    Family History  Problem Relation Age of Onset  . Breast cancer Mother 14  . Breast cancer Paternal Grandmother     Social History   Social History  . Marital status: Married    Spouse name: N/A  . Number of children: N/A  . Years of education: N/A   Occupational History  . Not on file.   Social History Main Topics  . Smoking status: Former Smoker    Quit date: 2013  . Smokeless tobacco: Never Used  . Alcohol use 0.0 oz/week     Comment: social (4-6 drinks/month)  . Drug use: No  . Sexual activity: Yes   Other Topics Concern  . Not on file   Social History Narrative  . No narrative on file    Allergies  Allergen Reactions  . Codeine Other (See Comments)    tachycardia  . Quinolones Hives    Outpatient Medications Prior to Visit  Medication Sig Dispense Refill  . cyclobenzaprine (FLEXERIL) 10 MG tablet Take  1 tablet (10 mg total) by mouth 3 (three) times daily as needed for muscle spasms. 20 tablet 0  . hydrochlorothiazide (MICROZIDE) 12.5 MG capsule Take 1 capsule by mouth daily.    Marland Kitchen ibuprofen (ADVIL,MOTRIN) 800 MG tablet Take 1 tablet (800 mg total) by mouth 3 (three) times daily. 30 tablet 0  . levonorgestrel-ethinyl estradiol (SEASONALE,INTROVALE,JOLESSA) 0.15-0.03 MG tablet Take 1 tablet by mouth daily.    . meclizine (ANTIVERT) 25 MG tablet Take 1 tablet (25 mg total) by mouth 3 (three) times daily as needed for dizziness. 30 tablet 0  . mometasone (NASONEX) 50 MCG/ACT nasal spray Place 2 sprays into the nose daily.     . Multiple Vitamin (MULTIVITAMIN) tablet Take 1 tablet by mouth.    . traMADol (ULTRAM) 50 MG tablet Take 1 tablet (50 mg total) by mouth every 6 (six) hours as needed. Dr Schermerhorn 30 tablet 0   No facility-administered medications prior to visit.     Review of Systems  Constitutional: Negative for chills, fever, malaise/fatigue and weight loss.  HENT: Negative for ear discharge, ear pain and sore throat.   Eyes: Negative for blurred vision.  Respiratory: Negative for cough, sputum production, shortness of breath and wheezing.   Cardiovascular: Negative for chest pain, palpitations and leg swelling.  Gastrointestinal: Negative for abdominal pain, blood in stool, constipation, diarrhea, heartburn, melena and nausea.  Genitourinary: Negative for dysuria, frequency, hematuria and urgency.  Musculoskeletal: Positive for neck pain. Negative for back pain, joint pain and myalgias.  Skin: Negative for rash.  Neurological: Positive for tingling, weakness and numbness. Negative for dizziness, sensory change, focal weakness and headaches.  Endo/Heme/Allergies: Negative for environmental allergies and polydipsia. Does not bruise/bleed easily.  Psychiatric/Behavioral: Negative for depression and suicidal ideas. The patient is not nervous/anxious and does not have insomnia.      Objective  Vitals:   05/02/17 1344  BP: (!) 130/92  Pulse: 88  Weight: 187 lb (84.8 kg)  Height: 5\' 5"  (1.651 m)    Physical Exam  Constitutional: She is well-developed, well-nourished, and in no distress. No distress.  HENT:  Head: Normocephalic and atraumatic.  Right Ear: External ear normal.  Left Ear: External ear normal.  Nose: Nose normal.  Mouth/Throat: Oropharynx is clear and moist.  Eyes: Pupils are equal, round, and reactive to light. Conjunctivae and EOM are normal. Right eye exhibits no discharge. Left eye exhibits no discharge.  Neck: Normal range of motion. Neck supple. No JVD present. No  thyromegaly present.  Cardiovascular: Normal rate, regular rhythm, normal heart sounds and intact distal pulses.  Exam reveals no gallop and no friction rub.   No murmur heard. Pulmonary/Chest: Effort normal and breath sounds normal.  Abdominal: Soft. Bowel sounds are normal. She exhibits no mass. There is no tenderness. There is no guarding.  Musculoskeletal: Normal range of motion. She exhibits no edema.  Lymphadenopathy:    She has no cervical adenopathy.  Neurological: She is alert. She has normal sensation, normal strength, normal reflexes and intact cranial nerves.  Skin: Skin is warm and dry. She is not diaphoretic.  Psychiatric: Mood and affect normal.  Nursing note and vitals reviewed.     Assessment & Plan  Problem List Items Addressed This Visit    None    Visit Diagnoses    Cervical disc disease    -  Primary   Relevant Medications   predniSONE (DELTASONE) 10 MG tablet   Other Relevant Orders   DG Cervical Spine Complete (Completed)  Cervical radiculopathy       Relevant Orders   DG Cervical Spine Complete (Completed)      Meds ordered this encounter  Medications  . DISCONTD: predniSONE (DELTASONE) 10 MG tablet    Sig: Take 1 tablet (10 mg total) by mouth daily with breakfast.    Dispense:  30 tablet    Refill:  0  . meloxicam (MOBIC) 15 MG tablet    Sig: Take 1 tablet (15 mg total) by mouth daily.    Dispense:  30 tablet    Refill:  0  . predniSONE (DELTASONE) 10 MG tablet    Sig: Take 1 tablet (10 mg total) by mouth daily with breakfast. Taper 4,4,4,3,3,3,2,2,2,1,1,1    Dispense:  30 tablet    Refill:  0      Dr. Deanna Jones [facility]  05/02/17

## 2017-05-09 ENCOUNTER — Other Ambulatory Visit: Payer: Self-pay

## 2017-05-09 DIAGNOSIS — M5090 Cervical disc disorder, unspecified, unspecified cervical region: Secondary | ICD-10-CM

## 2017-05-23 ENCOUNTER — Ambulatory Visit
Admission: RE | Admit: 2017-05-23 | Discharge: 2017-05-23 | Disposition: A | Discharge status: Home / Self Care | Payer: Managed Care, Other (non HMO) | Source: Ambulatory Visit | Attending: Obstetrics and Gynecology | Admitting: Obstetrics and Gynecology

## 2017-05-23 DIAGNOSIS — Z1231 Encounter for screening mammogram for malignant neoplasm of breast: Secondary | ICD-10-CM | POA: Insufficient documentation

## 2017-05-29 ENCOUNTER — Ambulatory Visit (INDEPENDENT_AMBULATORY_CARE_PROVIDER_SITE_OTHER): Payer: Managed Care, Other (non HMO) | Admitting: Family Medicine

## 2017-05-29 ENCOUNTER — Encounter: Payer: Self-pay | Admitting: Family Medicine

## 2017-05-29 VITALS — BP 130/70 | HR 60 | Ht 65.0 in | Wt 188.0 lb

## 2017-05-29 DIAGNOSIS — D229 Melanocytic nevi, unspecified: Secondary | ICD-10-CM

## 2017-05-29 NOTE — Progress Notes (Signed)
Name: Tiffany Humphrey   MRN: [id number]    DOB: 12-12-1970   Date:05/29/2017       Progress Note  Subjective  Chief Complaint  Chief Complaint  Patient presents with  . Nevus    on R_ leg- white circle has came up around it    Rash  This is a new problem. The current episode started more than 1 month ago (mole april/halo 2 weeks). The problem has been waxing and waning (evolving) since onset. The affected locations include the right lower leg. She was exposed to nothing. Pertinent negatives include no anorexia, congestion, cough, diarrhea, eye pain, facial edema, fatigue, fever, joint pain, nail changes, rhinorrhea, shortness of breath, sore throat or vomiting. Past treatments include nothing.    No problem-specific Assessment & Plan notes found for this encounter.   Past Medical History:  Diagnosis Date  . GERD (gastroesophageal reflux disease)   . Headache    migraines/stress, optical migraines  . Kidney disease   . Multilevel degenerative disc disease   . Vertigo    last episode 10/2016  . Wears contact lenses     Past Surgical History:  Procedure Laterality Date  . BREAST BIOPSY Right 06/08/2016   path pending (under local)  . FRONTAL SINUS EXPLORATION Bilateral 12/22/2016   Procedure: FRONTAL SINUS EXPLORATION;  Surgeon: Margaretha Sheffield, MD;  Location: [place];  Service: ENT;  Laterality: Bilateral;  . IMAGE GUIDED SINUS SURGERY N/A 12/22/2016   Procedure: IMAGE GUIDED SINUS SURGERY;  Surgeon: Margaretha Sheffield, MD;  Location: [place];  Service: ENT;  Laterality: N/A;  . MAXILLARY ANTROSTOMY Bilateral 12/22/2016   Procedure: MAXILLARY ANTROSTOMY;  Surgeon: Margaretha Sheffield, MD;  Location: [place];  Service: ENT;  Laterality: Bilateral;  . SEPTOPLASTY N/A 12/22/2016   Procedure: SEPTOPLASTY;  Surgeon: Margaretha Sheffield, MD;  Location: [place];  Service: ENT;  Laterality: N/A;  GAVE DISK TO CECE 4-5 KP  . TURBINATE REDUCTION N/A 12/22/2016   Procedure: TURBINATE REDUCTION partial inferior;  Surgeon: Margaretha Sheffield, MD;  Location: [place];  Service: ENT;  Laterality: N/A;    Family History  Problem Relation Age of Onset  . Breast cancer Mother 44  . Breast cancer Paternal Grandmother     Social History   Social History  . Marital status: Married    Spouse name: N/A  . Number of children: N/A  . Years of education: N/A   Occupational History  . Not on file.   Social History Main Topics  . Smoking status: Former Smoker    Quit date: 2013  . Smokeless tobacco: Never Used  . Alcohol use 0.0 oz/week     Comment: social (4-6 drinks/month)  . Drug use: No  . Sexual activity: Yes   Other Topics Concern  . Not on file   Social History Narrative  . No narrative on file    Allergies  Allergen Reactions  . Codeine Other (See Comments)    tachycardia  . Quinolones Hives    Outpatient Medications Prior to Visit  Medication Sig Dispense Refill  . cyclobenzaprine (FLEXERIL) 10 MG tablet Take 1 tablet (10 mg total) by mouth 3 (three) times daily as needed for muscle spasms. 20 tablet 0  . hydrochlorothiazide (MICROZIDE) 12.5 MG capsule Take 1 capsule by mouth daily.    Marland Kitchen ibuprofen (ADVIL,MOTRIN) 800 MG tablet Take 1 tablet (800 mg total) by mouth 3 (three) times daily. 30 tablet 0  . meclizine (ANTIVERT) 25 MG  tablet Take 1 tablet (25 mg total) by mouth 3 (three) times daily as needed for dizziness. 30 tablet 0  . meloxicam (MOBIC) 15 MG tablet Take 1 tablet (15 mg total) by mouth daily. 30 tablet 0  . mometasone (NASONEX) 50 MCG/ACT nasal spray Place 2 sprays into the nose daily.    . Multiple Vitamin (MULTIVITAMIN) tablet Take 1 tablet by mouth.    . levonorgestrel-ethinyl estradiol (SEASONALE,INTROVALE,JOLESSA) 0.15-0.03 MG tablet Take 1 tablet by mouth daily.    . predniSONE (DELTASONE) 10 MG tablet Take 1 tablet (10 mg total) by mouth daily with breakfast. Taper 4,4,4,3,3,3,2,2,2,1,1,1 30 tablet 0  .  traMADol (ULTRAM) 50 MG tablet Take 1 tablet (50 mg total) by mouth every 6 (six) hours as needed. Dr Schermerhorn 30 tablet 0   No facility-administered medications prior to visit.     Review of Systems  Constitutional: Negative for chills, fatigue, fever, malaise/fatigue and weight loss.  HENT: Negative for congestion, ear discharge, ear pain, rhinorrhea and sore throat.   Eyes: Negative for blurred vision and pain.  Respiratory: Negative for cough, sputum production, shortness of breath and wheezing.   Cardiovascular: Negative for chest pain, palpitations and leg swelling.  Gastrointestinal: Negative for abdominal pain, anorexia, blood in stool, constipation, diarrhea, heartburn, melena, nausea and vomiting.  Genitourinary: Negative for dysuria, frequency, hematuria and urgency.  Musculoskeletal: Negative for back pain, joint pain, myalgias and neck pain.  Skin: Positive for rash. Negative for nail changes.  Neurological: Negative for dizziness, tingling, sensory change, focal weakness and headaches.  Endo/Heme/Allergies: Negative for environmental allergies and polydipsia. Does not bruise/bleed easily.  Psychiatric/Behavioral: Negative for depression and suicidal ideas. The patient is not nervous/anxious and does not have insomnia.      Objective  Vitals:   05/29/17 0858  BP: 130/70  Pulse: 60  Weight: 188 lb (85.3 kg)  Height: 5\' 5"  (1.651 m)    Physical Exam  Constitutional: She is well-developed, well-nourished, and in no distress. No distress.  HENT:  Head: Normocephalic and atraumatic.  Right Ear: External ear normal.  Left Ear: External ear normal.  Nose: Nose normal.  Mouth/Throat: Oropharynx is clear and moist.  Eyes: Pupils are equal, round, and reactive to light. Conjunctivae and EOM are normal. Right eye exhibits no discharge. Left eye exhibits no discharge.  Neck: Normal range of motion. Neck supple. No JVD present. No thyromegaly present.  Cardiovascular:  Normal rate, regular rhythm, normal heart sounds and intact distal pulses.  Exam reveals no gallop and no friction rub.   No murmur heard. Pulmonary/Chest: Effort normal and breath sounds normal. She has no wheezes. She has no rales.  Abdominal: Soft. Bowel sounds are normal. She exhibits no mass. There is no tenderness. There is no guarding.  Musculoskeletal: Normal range of motion. She exhibits no edema.  Lymphadenopathy:    She has no cervical adenopathy.  Neurological: She is alert. She has normal reflexes.  Skin: Skin is warm and dry. Rash noted. Rash is maculopapular. She is not diaphoretic.     circumferencial amelanotic area48mm maculopapular nevus/ 1 cm surrounding amelanotic  Psychiatric: Mood and affect normal.  Nursing note and vitals reviewed.     Assessment & Plan  Problem List Items Addressed This Visit    None    Visit Diagnoses    Halo nevus    -  Primary   Relevant Orders   Ambulatory referral to Dermatology      No orders of the defined types were placed  in this encounter.     Dr. [facility]  05/29/17

## 2018-03-19 ENCOUNTER — Encounter: Payer: Self-pay | Admitting: Family Medicine

## 2018-03-19 ENCOUNTER — Ambulatory Visit: Payer: Managed Care, Other (non HMO) | Admitting: Family Medicine

## 2018-03-19 VITALS — BP 122/80 | HR 82 | Resp 16 | Ht 65.0 in | Wt 182.0 lb

## 2018-03-19 DIAGNOSIS — M654 Radial styloid tenosynovitis [de Quervain]: Secondary | ICD-10-CM | POA: Diagnosis not present

## 2018-03-19 MED ORDER — MELOXICAM 15 MG PO TABS: 15.0000 mg | ORAL_TABLET | Freq: Every day | ORAL | 1 refills | Status: DC

## 2018-03-19 NOTE — Progress Notes (Signed)
Name: Tiffany Humphrey   MRN: [id number]    DOB: August 15, 1971   Date:03/19/2018       Progress Note  Subjective  Chief Complaint  Chief Complaint  Patient presents with  . Hand Pain    Left hand pain that has gone on for years off and on.Marland KitchenMarland KitchenFlare up now since Friday with some swelling at wrist and aching around thumb    Wrist Pain   The pain is present in the left wrist. This is a recurrent problem. The current episode started in the past 7 days (Friday). The problem occurs constantly. The problem has been gradually worsening. The quality of the pain is described as aching. The pain is at a severity of 7/10. The pain is moderate. Associated symptoms include joint locking, numbness and tingling. Pertinent negatives include no fever, inability to bear weight, itching, joint swelling, limited range of motion or stiffness. The symptoms are aggravated by activity (grasping). She has tried NSAIDS for the symptoms. The treatment provided moderate relief. Family history does not include gout or rheumatoid arthritis. There is no history of diabetes, gout or osteoarthritis.    No problem-specific Assessment & Plan notes found for this encounter.   Past Medical History:  Diagnosis Date  . GERD (gastroesophageal reflux disease)   . Headache    migraines/stress, optical migraines  . Kidney disease   . Multilevel degenerative disc disease   . Vertigo    last episode 10/2016  . Wears contact lenses     Past Surgical History:  Procedure Laterality Date  . BREAST BIOPSY Right 06/08/2016   path pending (under local)  . FRONTAL SINUS EXPLORATION Bilateral 12/22/2016   Procedure: FRONTAL SINUS EXPLORATION;  Surgeon: Margaretha Sheffield, MD;  Location: [place];  Service: ENT;  Laterality: Bilateral;  . IMAGE GUIDED SINUS SURGERY N/A 12/22/2016   Procedure: IMAGE GUIDED SINUS SURGERY;  Surgeon: Margaretha Sheffield, MD;  Location: [facility];  Service: ENT;  Laterality: N/A;  . MAXILLARY ANTROSTOMY  Bilateral 12/22/2016   Procedure: MAXILLARY ANTROSTOMY;  Surgeon: Margaretha Sheffield, MD;  Location: [place];  Service: ENT;  Laterality: Bilateral;  . SEPTOPLASTY N/A 12/22/2016   Procedure: SEPTOPLASTY;  Surgeon: Margaretha Sheffield, MD;  Location: [place];  Service: ENT;  Laterality: N/A;  GAVE DISK TO CECE 4-5 KP  . TURBINATE REDUCTION N/A 12/22/2016   Procedure: TURBINATE REDUCTION partial inferior;  Surgeon: Margaretha Sheffield, MD;  Location: [place];  Service: ENT;  Laterality: N/A;    Family History  Problem Relation Age of Onset  . Breast cancer Mother 76  . Breast cancer Paternal Grandmother     Social History   Socioeconomic History  . Marital status: Married    Spouse name: Not on file  . Number of children: Not on file  . Years of education: Not on file  . Highest education level: Not on file  Occupational History  . Not on file  Social Needs  . Financial resource strain: Not on file  . Food insecurity:    Worry: Not on file    Inability: Not on file  . Transportation needs:    Medical: Not on file    Non-medical: Not on file  Tobacco Use  . Smoking status: Former Smoker    Last attempt to quit: 2013    Years since quitting: 6.5  . Smokeless tobacco: Never Used  Substance and Sexual Activity  . Alcohol use: Yes    Alcohol/week: 0.0 oz  Comment: social (4-6 drinks/month)  . Drug use: No  . Sexual activity: Yes  Lifestyle  . Physical activity:    Days per week: Not on file    Minutes per session: Not on file  . Stress: Not on file  Relationships  . Social connections:    Talks on phone: Not on file    Gets together: Not on file    Attends religious service: Not on file    Active member of club or organization: Not on file    Attends meetings of clubs or organizations: Not on file    Relationship status: Not on file  . Intimate partner violence:    Fear of current or ex partner: Not on file    Emotionally abused: Not on file     Physically abused: Not on file    Forced sexual activity: Not on file  Other Topics Concern  . Not on file  Social History Narrative  . Not on file    Allergies  Allergen Reactions  . Quinolones Hives  . Codeine Other (See Comments) and Palpitations    tachycardia    Outpatient Medications Prior to Visit  Medication Sig Dispense Refill  . hydrochlorothiazide (MICROZIDE) 12.5 MG capsule Take 1 capsule by mouth daily.    Marland Kitchen ibuprofen (ADVIL,MOTRIN) 200 MG tablet Take 200 mg by mouth every 6 (six) hours as needed (Taking 4-5 tabs at a time with hand pain.).    Marland Kitchen meclizine (ANTIVERT) 25 MG tablet Take 1 tablet (25 mg total) by mouth 3 (three) times daily as needed for dizziness. 30 tablet 0  . mometasone (NASONEX) 50 MCG/ACT nasal spray Place 2 sprays into the nose daily.    . Multiple Vitamin (MULTIVITAMIN) tablet Take 1 tablet by mouth.    . norethindrone-ethinyl estradiol 1/35 (ORTHO-NOVUM, NORTREL,CYCLAFEM) tablet Take by mouth.    Marland Kitchen ibuprofen (ADVIL,MOTRIN) 800 MG tablet Take 1 tablet (800 mg total) by mouth 3 (three) times daily. 30 tablet 0  . meloxicam (MOBIC) 15 MG tablet Take 1 tablet (15 mg total) by mouth daily. 30 tablet 0  . cyclobenzaprine (FLEXERIL) 10 MG tablet Take 1 tablet (10 mg total) by mouth 3 (three) times daily as needed for muscle spasms. 20 tablet 0   No facility-administered medications prior to visit.     Review of Systems  Constitutional: Negative for chills, fever, malaise/fatigue and weight loss.  HENT: Negative for ear discharge, ear pain and sore throat.   Eyes: Negative for blurred vision.  Respiratory: Negative for cough, sputum production, shortness of breath and wheezing.   Cardiovascular: Negative for chest pain, palpitations and leg swelling.  Gastrointestinal: Negative for abdominal pain, blood in stool, constipation, diarrhea, heartburn, melena and nausea.  Genitourinary: Negative for dysuria, frequency, hematuria and urgency.   Musculoskeletal: Negative for back pain, gout, joint pain, myalgias, neck pain and stiffness.  Skin: Negative for itching and rash.  Neurological: Positive for tingling and numbness. Negative for dizziness, sensory change, focal weakness and headaches.  Endo/Heme/Allergies: Negative for environmental allergies and polydipsia. Does not bruise/bleed easily.  Psychiatric/Behavioral: Negative for depression and suicidal ideas. The patient is not nervous/anxious and does not have insomnia.      Objective  Vitals:   03/19/18 0910  BP: 122/80  Pulse: 82  Resp: 16  Weight: 182 lb (82.6 kg)    Physical Exam  Constitutional: No distress.  HENT:  Head: Normocephalic and atraumatic.  Right Ear: External ear normal.  Left Ear: External ear normal.  Nose: Nose normal.  Mouth/Throat: Oropharynx is clear and moist.  Eyes: Pupils are equal, round, and reactive to light. Conjunctivae and EOM are normal. Right eye exhibits no discharge. Left eye exhibits no discharge.  Neck: Normal range of motion. Neck supple. No JVD present. No thyromegaly present.  Cardiovascular: Normal rate, regular rhythm, normal heart sounds and intact distal pulses. Exam reveals no gallop and no friction rub.  No murmur heard. Pulmonary/Chest: Effort normal and breath sounds normal.  Abdominal: Soft. Bowel sounds are normal. She exhibits no mass. There is no tenderness. There is no guarding.  Musculoskeletal: She exhibits no edema.       Left wrist: She exhibits decreased range of motion and tenderness. She exhibits no bony tenderness and no swelling.  Lymphadenopathy:    She has no cervical adenopathy.  Neurological: She is alert. She has normal reflexes.  Skin: Skin is warm and dry. She is not diaphoretic.      Assessment & Plan  Problem List Items Addressed This Visit    None    Visit Diagnoses    De Quervain's disease (radial styloid tenosynovitis)    -  Primary   Acute onset from repetitive use. Will  initiate meloxical 15 mg daily /soft splint with ace/ and use cold compress bid. If continue next step ortho referral.   Relevant Medications   meloxicam (MOBIC) 15 MG tablet      Meds ordered this encounter  Medications  . meloxicam (MOBIC) 15 MG tablet    Sig: Take 1 tablet (15 mg total) by mouth daily.    Dispense:  30 tablet    Refill:  1      Dr. [facility]  03/19/18

## 2018-03-19 NOTE — Patient Instructions (Signed)
De Quervain Tenosynovitis  Tendons attach muscles to bones. They also help with joint movements. When tendons become irritated or swollen, it is called tendinitis.  The extensor pollicis brevis (EPB) tendon connects the EPB muscle to a bone that is near the base of the thumb. The EPB muscle helps to straighten and extend the thumb. De Quervain tenosynovitis is a condition in which the EPB tendon lining (sheath) becomes irritated, thickened, and swollen. This condition is sometimes called stenosing tenosynovitis. This condition causes pain on the thumb side of the back of the wrist.  What are the causes?  Causes of this condition include:   Activities that repeatedly cause your thumb and wrist to extend.   A sudden increase in activity or change in activity that affects your wrist.    What increases the risk?  This condition is more likely to develop in:   Females.   People who have diabetes.   Women who have recently given birth.   People who are over 40 years of age.   People who do activities that involve repeated hand and wrist motions, such as tennis, racquetball, volleyball, gardening, and taking care of children.   People who do heavy labor.   People who have poor wrist strength and flexibility.   People who do not warm up properly before activities.    What are the signs or symptoms?  Symptoms of this condition include:   Pain or tenderness over the thumb side of the back of the wrist when your thumb and wrist are not moving.   Pain that gets worse when you straighten your thumb or extend your thumb or wrist.   Pain when the injured area is touched.   Locking or catching of the thumb joint while you bend and straighten your thumb.   Decreased thumb motion due to pain.   Swelling over the affected area.    How is this diagnosed?  This condition is diagnosed with a medical history and physical exam. Your health care provider will ask for details about your injury and ask about your  symptoms.  How is this treated?  Treatment may include the use of icing and medicines to reduce pain and swelling. You may also be advised to wear a splint or brace to limit your thumb and wrist motion. In less severe cases, treatment may also include working with a physical therapist to strengthen your wrist and calm the irritation around your EPB tendon sheath. In severe cases, surgery may be needed.  Follow these instructions at home:  If you have a splint or brace:   Wear it as told by your health care provider. Remove it only as told by your health care provider.   Loosen the splint or brace if your fingers become numb and tingle, or if they turn cold and blue.   Keep the splint or brace clean and dry.  Managing pain, stiffness, and swelling   If directed, apply ice to the injured area.  ? Put ice in a plastic bag.  ? Place a towel between your skin and the bag.  ? Leave the ice on for 20 minutes, 2-3 times per day.   Move your fingers often to avoid stiffness and to lessen swelling.   Raise (elevate) the injured area above the level of your heart while you are sitting or lying down.  General instructions   Return to your normal activities as told by your health care provider. Ask your health care provider   what activities are safe for you.   Take over-the-counter and prescription medicines only as told by your health care provider.   Keep all follow-up visits as told by your health care provider. This is important.   Do not drive or operate heavy machinery while taking prescription pain medicine.  Contact a health care provider if:   Your pain, tenderness, or swelling gets worse, even if you have had treatment.   You have numbness or tingling in your wrist, hand, or fingers on the injured side.  This information is not intended to replace advice given to you by your health care provider. Make sure you discuss any questions you have with your health care provider.  Document Released: 08/15/2005  Document Revised: 01/21/2016 Document Reviewed: 10/21/2014  Elsevier Interactive Patient Education  2018 [employer]

## 2018-04-05 ENCOUNTER — Other Ambulatory Visit: Payer: Self-pay

## 2018-04-05 MED ORDER — TAMSULOSIN HCL 0.4 MG PO CAPS
0.4000 mg | ORAL_CAPSULE | Freq: Every day | ORAL | 0 refills | Status: DC
Start: 2018-04-05 — End: 2018-07-30

## 2018-04-26 ENCOUNTER — Other Ambulatory Visit: Payer: Self-pay | Admitting: Obstetrics and Gynecology

## 2018-04-26 DIAGNOSIS — Z1231 Encounter for screening mammogram for malignant neoplasm of breast: Secondary | ICD-10-CM

## 2018-04-28 ENCOUNTER — Other Ambulatory Visit: Payer: Self-pay | Admitting: Family Medicine

## 2018-05-11 ENCOUNTER — Other Ambulatory Visit: Payer: Self-pay | Admitting: Family Medicine

## 2018-05-11 DIAGNOSIS — M654 Radial styloid tenosynovitis [de Quervain]: Secondary | ICD-10-CM

## 2018-05-24 ENCOUNTER — Ambulatory Visit
Admission: RE | Admit: 2018-05-24 | Discharge: 2018-05-24 | Disposition: A | Discharge status: Home / Self Care | Payer: Managed Care, Other (non HMO) | Source: Ambulatory Visit | Attending: Obstetrics and Gynecology | Admitting: Obstetrics and Gynecology

## 2018-05-24 DIAGNOSIS — Z1231 Encounter for screening mammogram for malignant neoplasm of breast: Secondary | ICD-10-CM | POA: Insufficient documentation

## 2018-05-28 ENCOUNTER — Other Ambulatory Visit: Payer: Self-pay | Admitting: Obstetrics and Gynecology

## 2018-05-28 DIAGNOSIS — N6320 Unspecified lump in the left breast, unspecified quadrant: Secondary | ICD-10-CM

## 2018-05-28 DIAGNOSIS — R921 Mammographic calcification found on diagnostic imaging of breast: Secondary | ICD-10-CM

## 2018-05-28 DIAGNOSIS — R928 Other abnormal and inconclusive findings on diagnostic imaging of breast: Secondary | ICD-10-CM

## 2018-06-01 ENCOUNTER — Ambulatory Visit
Admission: RE | Admit: 2018-06-01 | Discharge: 2018-06-01 | Disposition: A | Discharge status: Home / Self Care | Payer: Managed Care, Other (non HMO) | Source: Ambulatory Visit | Attending: Obstetrics and Gynecology | Admitting: Obstetrics and Gynecology

## 2018-06-01 DIAGNOSIS — R921 Mammographic calcification found on diagnostic imaging of breast: Secondary | ICD-10-CM

## 2018-06-01 DIAGNOSIS — R928 Other abnormal and inconclusive findings on diagnostic imaging of breast: Secondary | ICD-10-CM

## 2018-06-01 DIAGNOSIS — N6320 Unspecified lump in the left breast, unspecified quadrant: Secondary | ICD-10-CM

## 2018-06-04 ENCOUNTER — Other Ambulatory Visit: Payer: Self-pay | Admitting: Obstetrics and Gynecology

## 2018-06-04 DIAGNOSIS — R928 Other abnormal and inconclusive findings on diagnostic imaging of breast: Secondary | ICD-10-CM

## 2018-06-04 DIAGNOSIS — R921 Mammographic calcification found on diagnostic imaging of breast: Secondary | ICD-10-CM

## 2018-06-06 ENCOUNTER — Ambulatory Visit
Admission: RE | Admit: 2018-06-06 | Discharge: 2018-06-06 | Disposition: A | Discharge status: Home / Self Care | Payer: Managed Care, Other (non HMO) | Source: Ambulatory Visit | Attending: Obstetrics and Gynecology | Admitting: Obstetrics and Gynecology

## 2018-06-06 DIAGNOSIS — R921 Mammographic calcification found on diagnostic imaging of breast: Secondary | ICD-10-CM | POA: Diagnosis present

## 2018-06-06 DIAGNOSIS — R928 Other abnormal and inconclusive findings on diagnostic imaging of breast: Secondary | ICD-10-CM

## 2018-06-06 HISTORY — PX: BREAST BIOPSY: SHX20

## 2018-06-07 ENCOUNTER — Other Ambulatory Visit: Payer: Self-pay | Admitting: Family Medicine

## 2018-06-07 LAB — SURGICAL PATHOLOGY

## 2018-06-08 ENCOUNTER — Other Ambulatory Visit: Payer: Self-pay

## 2018-06-08 DIAGNOSIS — D0512 Intraductal carcinoma in situ of left breast: Secondary | ICD-10-CM

## 2018-06-08 NOTE — Progress Notes (Signed)
  Oncology Nurse Navigator Documentation  Navigator Location: [facility] (06/08/18 1400) Referral date to RadOnc/MedOnc: 06/12/18 (06/08/18 1400) )Navigator Encounter Type: Introductory phone call (06/08/18 1400)   Abnormal Finding Date: 06/01/18 (06/08/18 1400) Confirmed Diagnosis Date: 06/06/18 (06/08/18 1400)               Patient Visit Type: Initial (06/08/18 1400)   Barriers/Navigation Needs: Education;Coordination of Care (06/08/18 1400) Education: Coping with Diagnosis/ Prognosis;Accessing Care/ Finding Providers;Newly Diagnosed Cancer Education (06/08/18 1400) Interventions: Coordination of Care;Education;Referrals (06/08/18 1400)   Coordination of Care: Appts (06/08/18 1400) Education Method: Verbal (06/08/18 1400)                Time Spent with Patient: 60 (06/08/18 1400)   Introduced to [employer].  Will give Breast Cancer Treatment Handbook/folder with hospital services at initial Med/Onc visit with Dr. Tasia Catchings on 06/12/18 at 10:45.  Consult scheduled with Dr. Peyton Najjar on 06/14/18 at 4:00.  Patient voices interest in genetic testing.  States her mother just completed radiation treatment for 2 diagnosis of breast cancer.

## 2018-06-11 ENCOUNTER — Encounter: Payer: Self-pay | Admitting: *Deleted

## 2018-06-11 NOTE — Progress Notes (Signed)
  Oncology Nurse Navigator Documentation  Navigator Location: [facility] (06/11/18 0900) Referral date to RadOnc/MedOnc: 06/12/18 (06/11/18 0900) )Navigator Encounter Type: Telephone (06/11/18 0900) Telephone: Incoming Call (06/11/18 0900)                         Education: Coping with Diagnosis/ Prognosis (06/11/18 0900)                        Time Spent with Patient: 15 (06/11/18 0900)   Returned patient's phone call.  I had navigated with her mom this year.  She states Webb Silversmith had given her a call on Friday and is scheduled to see Medical Oncology and Surgery this week.  Answered her questions.  Anne or I will meet with her during her medical oncology consult.

## 2018-06-12 ENCOUNTER — Encounter: Payer: Self-pay | Admitting: Genetic Counselor

## 2018-06-12 ENCOUNTER — Encounter: Payer: Self-pay | Admitting: *Deleted

## 2018-06-12 ENCOUNTER — Telehealth: Payer: Self-pay | Admitting: Genetic Counselor

## 2018-06-12 ENCOUNTER — Inpatient Hospital Stay: Payer: Managed Care, Other (non HMO) | Attending: Oncology

## 2018-06-12 ENCOUNTER — Inpatient Hospital Stay: Payer: Managed Care, Other (non HMO) | Admitting: Oncology

## 2018-06-12 ENCOUNTER — Other Ambulatory Visit: Payer: Self-pay | Admitting: *Deleted

## 2018-06-12 DIAGNOSIS — D0512 Intraductal carcinoma in situ of left breast: Secondary | ICD-10-CM

## 2018-06-12 DIAGNOSIS — Z803 Family history of malignant neoplasm of breast: Secondary | ICD-10-CM | POA: Diagnosis not present

## 2018-06-12 DIAGNOSIS — Z87891 Personal history of nicotine dependence: Secondary | ICD-10-CM | POA: Diagnosis not present

## 2018-06-12 LAB — COMPREHENSIVE METABOLIC PANEL
ALBUMIN: 4.3 g/dL (ref 3.5–5.0)
ALK PHOS: 66 U/L (ref 38–126)
ALT: 22 U/L (ref 0–44)
AST: 28 U/L (ref 15–41)
Anion gap: 10 (ref 5–15)
BUN: 11 mg/dL (ref 6–20)
CALCIUM: 9.5 mg/dL (ref 8.9–10.3)
CHLORIDE: 104 mmol/L (ref 98–111)
CO2: 28 mmol/L (ref 22–32)
CREATININE: 0.79 mg/dL (ref 0.44–1.00)
GFR calc Af Amer: >60 mL/min (ref >60)
GFR calc non Af Amer: >60 mL/min (ref >60)
GLUCOSE: 89 mg/dL (ref 70–99)
Potassium: 4.1 mmol/L (ref 3.5–5.1)
Sodium: 142 mmol/L (ref 135–145)
Total Bilirubin: 0.8 mg/dL (ref 0.3–1.2)
Total Protein: 7.7 g/dL (ref 6.5–8.1)

## 2018-06-12 NOTE — Telephone Encounter (Signed)
Cancer Genetics            Telegenetics Initial Visit    Patient Name: Tiffany Humphrey Patient DOB: Feb 23, 1971 Patient Age: 47 y.o. Phone Call Date: 06/12/2018  Referring Provider: Randa Evens, MD  Reason for Visit: Evaluate for hereditary susceptibility to cancer    Assessment and Plan:  . Tiffany Humphrey personal history of breast cancer at age 28 warrants a genetics evaluation even though her family history is not highly suggestive of a hereditary predisposition to cancer. Her paternal aunt and paternal grandmother were both in their 24s at diagnosis of breast cancer.  . Testing is recommended to determine whether she has a pathogenic mutation that will impact her surgical decision as well as her screening and risk-reduction for future cancer. A negative result will be reassuring.  . Tiffany Humphrey wished to pursue genetic testing and a blood sample will be sent to [facility] for analysis. Her blood will be drawn later this afternoon. [facility]'s STAT breast panel was requested as it will impact surgical decisions. Results should be available in about 7-12 days. The 9 genes on this panel are ATM, BRCA1, BRCA2, CDH1, CHEK2, PALB2, PTEN, STK11, TP53. Once this test is complete, analysis of additional genes on a larger hereditary cancer panel will proceed. She will be called after each result is obtained.   Dr. Grayland Ormond was available for questions concerning this case. Total time spent by counseling by phone was approximately 25 minutes.   _____________________________________________________________________   History of Present Illness: Tiffany Humphrey, a 47 y.o. female, was referred for genetic counseling to discuss the possibility of a hereditary predisposition to cancer and discuss whether genetic testing is warranted. This was a telegenetics visit via phone.  Tiffany Humphrey had a left breast biopsy. Unfortunately, this showed breast cancer (DCIS). She indicated that  she will be using results of genetic testing to decide which surgery to have. The ER and PR status will be determined on the excision specimen.   Past Medical History:  Diagnosis Date  . GERD (gastroesophageal reflux disease)   . Headache    migraines/stress, optical migraines  . Kidney disease   . Multilevel degenerative disc disease   . Vertigo    last episode 10/2016  . Wears contact lenses     Past Surgical History:  Procedure Laterality Date  . BREAST BIOPSY Right 06/08/2016   CYSTIC PAPILLARY APOCRINE METAPLASIA.   Marland Kitchen BREAST BIOPSY Left 06/06/2018   left breast stereo X clip path pending  . FRONTAL SINUS EXPLORATION Bilateral 12/22/2016   Procedure: FRONTAL SINUS EXPLORATION;  Surgeon: Margaretha Sheffield, MD;  Location: [facility];  Service: ENT;  Laterality: Bilateral;  . IMAGE GUIDED SINUS SURGERY N/A 12/22/2016   Procedure: IMAGE GUIDED SINUS SURGERY;  Surgeon: Margaretha Sheffield, MD;  Location: [place];  Service: ENT;  Laterality: N/A;  . MAXILLARY ANTROSTOMY Bilateral 12/22/2016   Procedure: MAXILLARY ANTROSTOMY;  Surgeon: Margaretha Sheffield, MD;  Location: [place];  Service: ENT;  Laterality: Bilateral;  . SEPTOPLASTY N/A 12/22/2016   Procedure: SEPTOPLASTY;  Surgeon: Margaretha Sheffield, MD;  Location: [place];  Service: ENT;  Laterality: N/A;  GAVE DISK TO [facility] 4-5 KP  . TURBINATE REDUCTION N/A 12/22/2016   Procedure: TURBINATE REDUCTION partial inferior;  Surgeon: Margaretha Sheffield, MD;  Location: [place];  Service: ENT;  Laterality: N/A;    Family History: Significant diagnoses include the following:  Family History  Problem Relation Age of Onset  . Breast cancer Mother        dx 76s; 2nd primary at 4  . Breast cancer Paternal Grandmother        dx 4s; deceased 36s  . Breast cancer Paternal Aunt        dx 73s; currently 106s  . Breast cancer Cousin        dx 38s; currently late 31s; daughter of a paternal uncle  . Lung cancer Paternal  Uncle        smoker; deceased    Additionally, Tiffany Humphrey has one daughter (age 38). She has two brothers (ages 37 and 47). Her mother (noted above) has a brother and sister. Her father died at 86, related to heart problems. He had a total of 3 brothers and a sister.  Tiffany Humphrey ancestry is Caucasian - NOS. There is no known Jewish ancestry and no consanguinity.  Discussion: We reviewed the characteristics, features and inheritance patterns of hereditary cancer syndromes. We discussed her risk of harboring a mutation in the context of her personal and family history. We discussed the process of genetic testing, insurance coverage and implications of results: positive, negative and variant of uncertain significance (VUS).   Tiffany Humphrey questions were answered to her satisfaction today and she is welcome to call with any additional questions or concerns. Thank you for the referral and allowing [place] to share in the care of your patient.    Steele Berg, MS, [place] Certified Genetic Counselor phone: [phone]

## 2018-06-12 NOTE — Progress Notes (Signed)
  Oncology Nurse Navigator Documentation  Navigator Location: [facility] (06/12/18 0900)   )Navigator Encounter Type: Telephone (06/12/18 0900) Telephone: Incoming Call (06/12/18 0900)                       Barriers/Navigation Needs: Education;Coordination of Care (06/12/18 0900)   Interventions: Referrals;Coordination of Care (06/12/18 0900) Referrals: Genetics (06/12/18 0900) Coordination of Care: Appts (06/12/18 0900) Education Method: Verbal (06/12/18 0900)                Time Spent with Patient: 30 (06/12/18 0900)   Patient called today and wants to know if she can get her blood drawn today for genetic testing since she does not see Dr. Janese Banks until Friday.  Discussed with Dr. Janese Banks and she is agreeable to go ahead and have patient's blood drawn for genetic testing today.  Patient informed.  Patient also asked about getting her CMP drawn that she needs for Dr. Peyton Najjar.  Message sent to Moishe Spice, RN to ask about getting that drawn also.  Patient informed of approval to get blood drawn for genetic testing.  Referral sent to [place], the genetics counselor.  Will add patient to the lab for today.

## 2018-06-13 ENCOUNTER — Ambulatory Visit: Payer: Self-pay | Admitting: General Surgery

## 2018-06-13 ENCOUNTER — Other Ambulatory Visit: Payer: Self-pay | Admitting: General Surgery

## 2018-06-13 DIAGNOSIS — C50412 Malignant neoplasm of upper-outer quadrant of left female breast: Secondary | ICD-10-CM

## 2018-06-13 NOTE — H&P (View-Only) (Signed)
PATIENT PROFILE: Tiffany Humphrey is a 47 y.o. female who presents to the Clinic for consultation at the request of Dr. Windell Moment for evaluation of breast cancer.  PCP:  Armando Reichert, MD  HISTORY OF PRESENT ILLNESS: [name] reports had screening mammgram as per yearly routine. Denies any breast pain. Denies palpable breast mass. Denies nipple retraction or discharge.   Family history of breast cancer: Grandmother and mother (twice) Family history of other cancers: none Menarche: 47 years old Menopause: n/a Used OCP: yes History of Radiation to the chest: none  PROBLEM LIST:         Problem List  Date Reviewed: 08/23/2016         Noted   Migraines Unknown      GENERAL REVIEW OF SYSTEMS:   General ROS: negative for - chills, fatigue, fever, weight gain or weight loss Allergy and Immunology ROS: negative for - hives  Hematological and Lymphatic ROS: negative for - bleeding problems or bruising, negative for palpable nodes Endocrine ROS: negative for - heat or cold intolerance, hair changes Respiratory ROS: negative for - cough, shortness of breath or wheezing Cardiovascular ROS: no chest pain or palpitations GI ROS: negative for nausea, vomiting, abdominal pain, diarrhea, constipation Musculoskeletal ROS: negative for - joint swelling or muscle pain Neurological ROS: negative for - confusion, syncope Dermatological ROS: negative for pruritus and rash Psychiatric: negative for anxiety, depression, difficulty sleeping and memory loss  MEDICATIONS: CurrentMedications        Current Outpatient Medications  Medication Sig Dispense Refill  . hydroCHLOROthiazide (MICROZIDE) 12.5 mg capsule Take 1 capsule (12.5 mg total) by mouth once daily 90 capsule 3  . levocetirizine (XYZAL) 5 MG tablet Take 5 mg by mouth every evening    . naproxen sodium (ALEVE, ANAPROX) 220 MG tablet Take 1 tablet (220 mg total) by mouth 2 (two) times daily with meals. 90 tablet 1   . norethindrone-ethinyl estradiol (ALYACEN 1/35, 28,) 1-35 mg-mcg tablet Take 1 tablet by mouth once daily 28 tablet 11  . omeprazole (PRILOSEC) 20 MG DR capsule Take 20 mg by mouth once daily    . traMADol (ULTRAM) 50 mg tablet Take 1 tablet (50 mg total) by mouth every 6 (six) hours as needed for Pain 90 tablet 1  . zolpidem (AMBIEN) 5 MG tablet Take 1 tablet (5 mg total) by mouth nightly as needed for Sleep 20 tablet 1   No current facility-administered medications for this visit.       ALLERGIES: Codeine and Quinolones  PAST MEDICAL HISTORY:     Past Medical History:  Diagnosis Date  . Cancer (CMS-HCC) 10-06/2018   Breast  . Cervical dysplasia   . Migraines     PAST SURGICAL HISTORY: Past Surgical History:  Procedure Laterality Date  . BREAST EXCISIONAL BIOPSY    . FUNCTIONAL ENDOSCOPIC SINUS SURGERY     deviated septum   . OTHER SURGERY     cryotherapy, in her 62's     FAMILY HISTORY:      Family History  Problem Relation Age of Onset  . High blood pressure (Hypertension) Mother   . Breast cancer Mother        13 yrs ago in l breast, currently has in r breast-estrogen related  . Heart disease Father        myocardial infarction  . Breast cancer Paternal Grandmother      SOCIAL HISTORY: Social History     Socioeconomic History  . Marital status:  Married    Spouse name: Not on file  . Number of children: Not on file  . Years of education: Not on file  . Highest education level: Not on file  Occupational History  . Not on file  Social Needs  . Financial resource strain: Not on file  . Food insecurity:    Worry: Not on file    Inability: Not on file  . Transportation needs:    Medical: Not on file    Non-medical: Not on file  Tobacco Use  . Smoking status: Former Smoker    Packs/day: 0.00    Years: 0.00    Pack years: 0.00  . Smokeless tobacco: Never Used  Substance and Sexual Activity  . Alcohol  use: Yes    Comment: Sometimes  . Drug use: Never  . Sexual activity: Yes    Partners: Male    Birth control/protection: OCP  Other Topics Concern  . Not on file  Social History Narrative  . Not on file      PHYSICAL EXAM:    Vitals:   06/11/18 1505  BP: (!) 146/93  Pulse: 99  Temp: 36.2 C (97.2 F)   Body mass index is 30.12 kg/m. Weight: 82.1 kg (181 lb)   GENERAL: Alert, active, oriented x3  HEENT: Pupils equal reactive to light. Extraocular movements are intact. Sclera clear. Palpebral conjunctiva normal red color.Pharynx clear.  NECK: Supple with no palpable mass and no adenopathy.  LUNGS: Sound clear with no rales rhonchi or wheezes.  HEART: Regular rhythm S1 and S2 without murmur.  BREAST: right breast normal without mass, skin or nipple changes or axillary nodes, left breast normal without mass, skin or nipple changes or axillary nodes.  ABDOMEN: Soft and depressible, nontender with no palpable mass, no hepatomegaly.  EXTREMITIES: Well-developed well-nourished symmetrical with no dependent edema.  NEUROLOGICAL: Awake alert oriented, facial expression symmetrical, moving all extremities.  REVIEW OF DATA: I have reviewed the following data today:      Appointment on 05/30/2018  Component Date Value  . [place] - [facility] 05/30/2018 5.3   Office Visit on 04/26/2018  Component Date Value  . Glucose 04/26/2018 82   . Sodium 04/26/2018 139   . Potassium 04/26/2018 3.4*  . Chloride 04/26/2018 101   . Carbon Dioxide (CO2) 04/26/2018 25.2   . Calcium 04/26/2018 9.7   . Urea Nitrogen (BUN) 04/26/2018 8   . Creatinine 04/26/2018 0.9   . Glomerular Filtration Ra* 04/26/2018 67   . BUN/Crea Ratio 04/26/2018 8.9   . Anion Gap w/K 04/26/2018 16.2*  . WBC (White Blood Cell Co* 04/26/2018 8.0   . RBC (Red Blood Cell Coun* 04/26/2018 4.98   . Hemoglobin 04/26/2018 14.5   . Hematocrit 04/26/2018 43.0   . MCV (Mean Corpuscular Vo* 04/26/2018 86.3    . MCH (Mean Corpuscular He* 04/26/2018 29.1   . MCHC (Mean Corpuscular H* 04/26/2018 33.7   . Platelet Count 04/26/2018 352   . RDW-CV (Red Cell Distrib* 04/26/2018 12.7   . MPV (Mean Platelet Volum* 04/26/2018 10.6   . Neutrophils 04/26/2018 4.91   . Lymphocytes 04/26/2018 2.35   . Monocytes 04/26/2018 0.58   . Eosinophils 04/26/2018 0.06   . Basophils 04/26/2018 0.06   . Neutrophil % 04/26/2018 61.5   . Lymphocyte % 04/26/2018 29.5   . Monocyte % 04/26/2018 7.3   . Eosinophil % 04/26/2018 0.8*  . Basophil% 04/26/2018 0.8   . Immature Granulocyte % 04/26/2018 0.1   . Immature  Granulocyte Cou* 04/26/2018 0.01   . Cholesterol, Total 04/26/2018 204*  . Triglyceride 04/26/2018 151   . HDL (High Density Lipopr* 04/26/2018 53.3   . LDL (Low Density Lipopro* 04/26/2018 121   . VLDL Cholesterol 04/26/2018 30   . Cholesterol/HDL Ratio 04/26/2018 3.8   . Inhibin A, Ultrasensitiv* 04/26/2018 4.9   . [place] - [facility] 04/26/2018 10.5   . DIAGNOSIS: - [facility] 04/26/2018 Comment   . Specimen adequacy: - Lab* 04/26/2018 Comment   . Clinician provided ICD10* 04/26/2018 Comment   . PERFORMED BY: - [facility] 04/26/2018 Comment   . QC reviewed by: - [facility] 04/26/2018 Comment   . . - [facility] 04/26/2018 .   Marland Kitchen Note: - [facility] 04/26/2018 Comment   . Test Methodology: - [facility] 04/26/2018 Comment   . HPV, high-risk - [facility] 04/26/2018 Negative   . Thyroid Stimulating Horm* 04/26/2018 1.973   . Thyroxine, Free (FT4) 04/26/2018 0.79      ASSESSMENT: Ms. Marsteller is a 47 y.o. female presenting for consultation for breast cancer.    Patient was oriented again about the pathology results. Surgical alternatives were discussed with patient including partial vs total mastectomy. Surgical technique and post operative care was discussed with patient. Risk of surgery was discussed with patient including but not limited to: wound infection, seroma, hematoma, brachial plexopathy, mondor's disease (thrombosis of  small veins of breast), chronic wound pain, breast lymphedema, altered sensation to the nipple and cosmesis among others.  Due to patient family history of breast cancer on her mother and grandmother and patient young presentation of breast cancer, genetic testing is indicated. It was discussed with patient that this test may help decide which type of surgery will be better for her. If she is BRCA positive, bilateral mastectomy with reconstruction will be an option to consider. If the test is negative, partial mastectomy with SLNx, radiation and +/- anti estrogen therapy will be recommended. Sentinel lymph node biopsy will be recommended since the tumor is on the upper outer quadrant and is high grade with comedo type with questionable micro invasion. If patient is upgraded to invasive carcinoma, SLNBx may be difficult due to scarring on the area.   PLAN: 1. Evaluation by Oncologist and Genetic testing (done) 2. Left needle guided partial mastectomy with sentinel lymph node biopsy.   Patient, her husband and her mother verbalized understanding, all questions were answered, and were agreeable with the plan outlined above.     Herbert Pun, MD

## 2018-06-13 NOTE — H&P (Signed)
PATIENT PROFILE: Tiffany Humphrey is a 47 y.o. female who presents to the Clinic for consultation at the request of Dr. Windell Moment for evaluation of breast cancer.  PCP:  Armando Reichert, MD  HISTORY OF PRESENT ILLNESS: Ms. Baldonado reports had screening mammgram as per yearly routine. Denies any breast pain. Denies palpable breast mass. Denies nipple retraction or discharge.   Family history of breast cancer: Grandmother and mother (twice) Family history of other cancers: none Menarche: 47 years old Menopause: n/a Used OCP: yes History of Radiation to the chest: none  PROBLEM LIST:         Problem List  Date Reviewed: 08/23/2016         Noted   Migraines Unknown      GENERAL REVIEW OF SYSTEMS:   General ROS: negative for - chills, fatigue, fever, weight gain or weight loss Allergy and Immunology ROS: negative for - hives  Hematological and Lymphatic ROS: negative for - bleeding problems or bruising, negative for palpable nodes Endocrine ROS: negative for - heat or cold intolerance, hair changes Respiratory ROS: negative for - cough, shortness of breath or wheezing Cardiovascular ROS: no chest pain or palpitations GI ROS: negative for nausea, vomiting, abdominal pain, diarrhea, constipation Musculoskeletal ROS: negative for - joint swelling or muscle pain Neurological ROS: negative for - confusion, syncope Dermatological ROS: negative for pruritus and rash Psychiatric: negative for anxiety, depression, difficulty sleeping and memory loss  MEDICATIONS: CurrentMedications        Current Outpatient Medications  Medication Sig Dispense Refill  . hydroCHLOROthiazide (MICROZIDE) 12.5 mg capsule Take 1 capsule (12.5 mg total) by mouth once daily 90 capsule 3  . levocetirizine (XYZAL) 5 MG tablet Take 5 mg by mouth every evening    . naproxen sodium (ALEVE, ANAPROX) 220 MG tablet Take 1 tablet (220 mg total) by mouth 2 (two) times daily with meals. 90 tablet 1   . norethindrone-ethinyl estradiol (ALYACEN 1/35, 28,) 1-35 mg-mcg tablet Take 1 tablet by mouth once daily 28 tablet 11  . omeprazole (PRILOSEC) 20 MG DR capsule Take 20 mg by mouth once daily    . traMADol (ULTRAM) 50 mg tablet Take 1 tablet (50 mg total) by mouth every 6 (six) hours as needed for Pain 90 tablet 1  . zolpidem (AMBIEN) 5 MG tablet Take 1 tablet (5 mg total) by mouth nightly as needed for Sleep 20 tablet 1   No current facility-administered medications for this visit.       ALLERGIES: Codeine and Quinolones  PAST MEDICAL HISTORY:     Past Medical History:  Diagnosis Date  . Cancer (CMS-HCC) 10-06/2018   Breast  . Cervical dysplasia   . Migraines     PAST SURGICAL HISTORY: Past Surgical History:  Procedure Laterality Date  . BREAST EXCISIONAL BIOPSY    . FUNCTIONAL ENDOSCOPIC SINUS SURGERY     deviated septum   . OTHER SURGERY     cryotherapy, in her 70's     FAMILY HISTORY:      Family History  Problem Relation Age of Onset  . High blood pressure (Hypertension) Mother   . Breast cancer Mother        13 yrs ago in l breast, currently has in r breast-estrogen related  . Heart disease Father        myocardial infarction  . Breast cancer Paternal Grandmother      SOCIAL HISTORY: Social History     Socioeconomic History  . Marital status:  Married    Spouse name: Not on file  . Number of children: Not on file  . Years of education: Not on file  . Highest education level: Not on file  Occupational History  . Not on file  Social Needs  . Financial resource strain: Not on file  . Food insecurity:    Worry: Not on file    Inability: Not on file  . Transportation needs:    Medical: Not on file    Non-medical: Not on file  Tobacco Use  . Smoking status: Former Smoker    Packs/day: 0.00    Years: 0.00    Pack years: 0.00  . Smokeless tobacco: Never Used  Substance and Sexual Activity  . Alcohol  use: Yes    Comment: Sometimes  . Drug use: Never  . Sexual activity: Yes    Partners: Male    Birth control/protection: OCP  Other Topics Concern  . Not on file  Social History Narrative  . Not on file      PHYSICAL EXAM:    Vitals:   06/11/18 1505  BP: (!) 146/93  Pulse: 99  Temp: 36.2 C (97.2 F)   Body mass index is 30.12 kg/m. Weight: 82.1 kg (181 lb)   GENERAL: Alert, active, oriented x3  HEENT: Pupils equal reactive to light. Extraocular movements are intact. Sclera clear. Palpebral conjunctiva normal red color.Pharynx clear.  NECK: Supple with no palpable mass and no adenopathy.  LUNGS: Sound clear with no rales rhonchi or wheezes.  HEART: Regular rhythm S1 and S2 without murmur.  BREAST: right breast normal without mass, skin or nipple changes or axillary nodes, left breast normal without mass, skin or nipple changes or axillary nodes.  ABDOMEN: Soft and depressible, nontender with no palpable mass, no hepatomegaly.  EXTREMITIES: Well-developed well-nourished symmetrical with no dependent edema.  NEUROLOGICAL: Awake alert oriented, facial expression symmetrical, moving all extremities.  REVIEW OF DATA: I have reviewed the following data today:      Appointment on 05/30/2018  Component Date Value  . [place] - [facility] 05/30/2018 5.3   Office Visit on 04/26/2018  Component Date Value  . Glucose 04/26/2018 82   . Sodium 04/26/2018 139   . Potassium 04/26/2018 3.4*  . Chloride 04/26/2018 101   . Carbon Dioxide (CO2) 04/26/2018 25.2   . Calcium 04/26/2018 9.7   . Urea Nitrogen (BUN) 04/26/2018 8   . Creatinine 04/26/2018 0.9   . Glomerular Filtration Ra* 04/26/2018 67   . BUN/Crea Ratio 04/26/2018 8.9   . Anion Gap w/K 04/26/2018 16.2*  . WBC (White Blood Cell Co* 04/26/2018 8.0   . RBC (Red Blood Cell Coun* 04/26/2018 4.98   . Hemoglobin 04/26/2018 14.5   . Hematocrit 04/26/2018 43.0   . MCV (Mean Corpuscular Vo* 04/26/2018 86.3    . MCH (Mean Corpuscular He* 04/26/2018 29.1   . MCHC (Mean Corpuscular H* 04/26/2018 33.7   . Platelet Count 04/26/2018 352   . RDW-CV (Red Cell Distrib* 04/26/2018 12.7   . MPV (Mean Platelet Volum* 04/26/2018 10.6   . Neutrophils 04/26/2018 4.91   . Lymphocytes 04/26/2018 2.35   . Monocytes 04/26/2018 0.58   . Eosinophils 04/26/2018 0.06   . Basophils 04/26/2018 0.06   . Neutrophil % 04/26/2018 61.5   . Lymphocyte % 04/26/2018 29.5   . Monocyte % 04/26/2018 7.3   . Eosinophil % 04/26/2018 0.8*  . Basophil% 04/26/2018 0.8   . Immature Granulocyte % 04/26/2018 0.1   . Immature  Granulocyte Cou* 04/26/2018 0.01   . Cholesterol, Total 04/26/2018 204*  . Triglyceride 04/26/2018 151   . HDL (High Density Lipopr* 04/26/2018 53.3   . LDL (Low Density Lipopro* 04/26/2018 121   . VLDL Cholesterol 04/26/2018 30   . Cholesterol/HDL Ratio 04/26/2018 3.8   . Inhibin A, Ultrasensitiv* 04/26/2018 4.9   . [place] - [facility] 04/26/2018 10.5   . DIAGNOSIS: - [facility] 04/26/2018 Comment   . Specimen adequacy: - Lab[facility] 04/26/2018 Comment   . Clinician provided ICD10* 04/26/2018 Comment   . PERFORMED BY: - [facility] 04/26/2018 Comment   . QC reviewed by: - [facility] 04/26/2018 Comment   . . - [facility] 04/26/2018 .   Marland Kitchen Note: - [facility] 04/26/2018 Comment   . Test Methodology: - [facility] 04/26/2018 Comment   . HPV, high-risk - [facility] 04/26/2018 Negative   . Thyroid Stimulating Horm* 04/26/2018 1.973   . Thyroxine, Free (FT4) 04/26/2018 0.79      ASSESSMENT: Ms. Marsteller is a 47 y.o. female presenting for consultation for breast cancer.    Patient was oriented again about the pathology results. Surgical alternatives were discussed with patient including partial vs total mastectomy. Surgical technique and post operative care was discussed with patient. Risk of surgery was discussed with patient including but not limited to: wound infection, seroma, hematoma, brachial plexopathy, mondor's disease (thrombosis of  small veins of breast), chronic wound pain, breast lymphedema, altered sensation to the nipple and cosmesis among others.  Due to patient family history of breast cancer on her mother and grandmother and patient young presentation of breast cancer, genetic testing is indicated. It was discussed with patient that this test may help decide which type of surgery will be better for her. If she is BRCA positive, bilateral mastectomy with reconstruction will be an option to consider. If the test is negative, partial mastectomy with SLNx, radiation and +/- anti estrogen therapy will be recommended. Sentinel lymph node biopsy will be recommended since the tumor is on the upper outer quadrant and is high grade with comedo type with questionable micro invasion. If patient is upgraded to invasive carcinoma, SLNBx may be difficult due to scarring on the area.   PLAN: 1. Evaluation by Oncologist and Genetic testing (done) 2. Left needle guided partial mastectomy with sentinel lymph node biopsy.   Patient, her husband and her mother verbalized understanding, all questions were answered, and were agreeable with the plan outlined above.     Herbert Pun, MD

## 2018-06-15 ENCOUNTER — Inpatient Hospital Stay: Payer: Managed Care, Other (non HMO) | Admitting: Oncology

## 2018-06-15 ENCOUNTER — Encounter: Payer: Self-pay | Admitting: Oncology

## 2018-06-15 VITALS — BP 117/78 | HR 82 | Temp 97.7°F | Resp 18 | Ht 65.0 in | Wt 180.3 lb

## 2018-06-15 DIAGNOSIS — Z803 Family history of malignant neoplasm of breast: Secondary | ICD-10-CM

## 2018-06-15 DIAGNOSIS — Z7189 Other specified counseling: Secondary | ICD-10-CM

## 2018-06-15 DIAGNOSIS — Z87891 Personal history of nicotine dependence: Secondary | ICD-10-CM

## 2018-06-15 DIAGNOSIS — D0512 Intraductal carcinoma in situ of left breast: Secondary | ICD-10-CM

## 2018-06-18 ENCOUNTER — Encounter: Payer: Self-pay | Admitting: Oncology

## 2018-06-18 ENCOUNTER — Encounter
Admission: RE | Admit: 2018-06-18 | Discharge: 2018-06-18 | Disposition: A | Discharge status: Home / Self Care | Payer: Managed Care, Other (non HMO) | Source: Ambulatory Visit | Attending: General Surgery | Admitting: General Surgery

## 2018-06-18 ENCOUNTER — Other Ambulatory Visit: Payer: Self-pay

## 2018-06-18 DIAGNOSIS — C50919 Malignant neoplasm of unspecified site of unspecified female breast: Secondary | ICD-10-CM | POA: Insufficient documentation

## 2018-06-18 HISTORY — DX: Other complications of anesthesia, initial encounter: T88.59XA

## 2018-06-18 HISTORY — DX: Adverse effect of unspecified anesthetic, initial encounter: T41.45XA

## 2018-06-18 HISTORY — DX: Personal history of urinary calculi: Z87.442

## 2018-06-18 HISTORY — DX: Anxiety disorder, unspecified: F41.9

## 2018-06-18 HISTORY — DX: Nausea with vomiting, unspecified: R11.2

## 2018-06-18 HISTORY — DX: Other specified postprocedural states: Z98.890

## 2018-06-18 HISTORY — DX: Malignant (primary) neoplasm, unspecified: C80.1

## 2018-06-18 NOTE — Patient Instructions (Signed)
Your procedure is scheduled on: 06-22-18 FRIDAY Report to [place] AT 8:30 AM PER PT  Remember: Instructions that are not followed completely may result in serious medical risk, up to and including death, or upon the discretion of your surgeon and anesthesiologist your surgery may need to be rescheduled.    _x___ 1. Do not eat food after midnight the night before your procedure. NO GUM OR CANDY AFTER MIDNIGHT.  You may drink clear liquids up to 2 hours before you are scheduled to arrive at the hospital for your procedure.  Do not drink clear liquids within 2 hours of your scheduled arrival to the hospital.  Clear liquids include  --Water or Apple juice without pulp  --Clear carbohydrate beverage such as ClearFast or Gatorade  --Black Coffee or Clear Tea (No milk, no creamers, do not add anything to the coffee or Tea   ____Ensure clear carbohydrate drink on the way to the hospital for bariatric patients  ____Ensure clear carbohydrate drink 3 hours before surgery for Dr Dwyane Luo patients if physician instructed.     __x__ 2. No Alcohol for 24 hours before or after surgery.   __x__3. No Smoking or e-cigarettes for 24 prior to surgery.  Do not use any chewable tobacco products for at least 6 hour prior to surgery   ____  4. Bring all medications with you on the day of surgery if instructed.    __x__ 5. Notify your doctor if there is any change in your medical condition     (cold, fever, infections).    x___6. On the morning of surgery brush your teeth with toothpaste and water.  You may rinse your mouth with mouth wash if you wish.  Do not swallow any toothpaste or mouthwash.   Do not wear jewelry, make-up, hairpins, clips or nail polish.  Do not wear lotions, powders, or perfumes. You may wear deodorant.  Do not shave 48 hours prior to surgery. Men may shave face and neck.  Do not bring valuables to the hospital.    [facility] is not responsible for any belongings or valuables.              Contacts, dentures or bridgework may not be worn into surgery.  Leave your suitcase in the car. After surgery it may be brought to your room.  For patients admitted to the hospital, discharge time is determined by your treatment team.  _  Patients discharged the day of surgery will not be allowed to drive home.  You will need someone to drive you home and stay with you the night of your procedure.    Please read over the following fact sheets that you were given:   [facility] Preparing for Surgery   _x___ TAKE THE FOLLOWING MEDICATION THE MORNING OF SURGERY WITH A SMALL SIP OF WATER. These include:  1. XYZAL  2. PRILOSEC  3. TAKE AN EXTRA PRILOSEC Thursday NIGHT BEFORE BED  4.  5.  6.  ____Fleets enema or Magnesium Citrate as directed.   _x___ Use CHG Soap or sage wipes as directed on instruction sheet   ____ Use inhalers on the day of surgery and bring to hospital day of surgery  ____ Stop Metformin and Janumet 2 days prior to surgery.    ____ Take 1/2 of usual insulin dose the night before surgery and none on the morning surgery.   ____ Follow recommendations from Cardiologist, Pulmonologist or PCP regarding stopping Aspirin, Coumadin, Plavix ,Eliquis, Effient, or Pradaxa, and  Pletal.  X____Stop Anti-inflammatories such as Advil, Aleve, Ibuprofen, Motrin, Naproxen, Naprosyn, Goodies powders or aspirin products NOW-OK to take Tylenol OR TRAMADOL   ____ Stop supplements until after surgery.    ____ Bring C-Pap to the hospital.

## 2018-06-18 NOTE — Progress Notes (Signed)
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 Ronnald Ramp  Date of visit: 06/18/18   History of presenting illness-patient is a 47 year old female whose mother was recently diagnosed with DCIS.  She recently underwent a screening mammogram which showed abnormal calcifications in her left breast.  Diagnostic mammogram and ultrasound confirmed 9 x 5 x 5 mm calcifications.  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  There was a single focus worrisome but not diagnostic for microinvasion.  Patient has met with surgery and is tentatively scheduled to undergo lumpectomy and sentinel lymph node biopsy next week.  Patient states that she has used birth control for the last 25 years.  She used to have regular.  But about 5 months ago she did not have her menstrual cycles for about 5 months.  At that point her birth control was temporarily held and she did have her menstrual cycle after 5 months.  She did restart her birth control at that point.  She feels well today and denies any complaints.  Her appetite is good and she denies any unintentional weight loss. She is G1P1L1.  No other family history of breast cancer other than her mother had DCIS.  ECOG PS- 0  Pain scale- 0   Review of systems- Review of Systems  Constitutional: Negative for chills, fever, malaise/fatigue and weight loss.  HENT: Negative for congestion, ear discharge and nosebleeds.   Eyes: Negative for blurred vision.  Respiratory: Negative for cough, hemoptysis, sputum production, shortness of breath and wheezing.   Cardiovascular: Negative for chest pain, palpitations, orthopnea and claudication.  Gastrointestinal: Negative for abdominal pain,  blood in stool, constipation, diarrhea, heartburn, melena, nausea and vomiting.  Genitourinary: Negative for dysuria, flank pain, frequency, hematuria and urgency.  Musculoskeletal: Negative for back pain, joint pain and myalgias.  Skin: Negative for rash.  Neurological: Negative for dizziness, tingling, focal weakness, seizures, weakness and headaches.  Endo/Heme/Allergies: Does not bruise/bleed easily.  Psychiatric/Behavioral: Negative for depression and suicidal ideas. The patient does not have insomnia.     Allergies  Allergen Reactions  . Quinolones Hives  . Clindamycin Hives  . Codeine Other (See Comments) and Palpitations    tachycardia    Patient Active Problem List   Diagnosis Date Noted  . Migraines 08/03/2015     Past Medical History:  Diagnosis Date  . Anxiety   . Cancer (HCC)    LEFT BREAST   . Complication of anesthesia   . GERD (gastroesophageal reflux disease)   . Headache    migraines/stress, optical migraines  . History of kidney stones   . Multilevel degenerative disc disease   . PONV (postoperative nausea and vomiting)   . Vertigo    last episode 10/2016  . Wears contact lenses      Past Surgical History:  Procedure Laterality Date  . BREAST BIOPSY Right 06/08/2016   CYSTIC PAPILLARY APOCRINE METAPLASIA.   Marland Kitchen BREAST BIOPSY Left 06/06/2018   left breast stereo X clip path pending  . FRONTAL SINUS EXPLORATION Bilateral 12/22/2016   Procedure: FRONTAL SINUS EXPLORATION;  Surgeon: Margaretha Sheffield, MD;  Location: [place];  Service: ENT;  Laterality: Bilateral;  . IMAGE GUIDED SINUS SURGERY N/A 12/22/2016   Procedure: IMAGE  GUIDED SINUS SURGERY;  Surgeon: Margaretha Sheffield, MD;  Location: [place];  Service: ENT;  Laterality: N/A;  . MAXILLARY ANTROSTOMY Bilateral 12/22/2016   Procedure: MAXILLARY ANTROSTOMY;  Surgeon: Margaretha Sheffield, MD;  Location: [place];  Service: ENT;  Laterality: Bilateral;  . SEPTOPLASTY N/A 12/22/2016    Procedure: SEPTOPLASTY;  Surgeon: Margaretha Sheffield, MD;  Location: [place];  Service: ENT;  Laterality: N/A;  GAVE DISK TO CECE 4-5 KP  . TURBINATE REDUCTION N/A 12/22/2016   Procedure: TURBINATE REDUCTION partial inferior;  Surgeon: Margaretha Sheffield, MD;  Location: [place];  Service: ENT;  Laterality: N/A;    Social History   Socioeconomic History  . Marital status: Married    Spouse name: Not on file  . Number of children: Not on file  . Years of education: Not on file  . Highest education level: Not on file  Occupational History  . Not on file  Social Needs  . Financial resource strain: Not on file  . Food insecurity:    Worry: Not on file    Inability: Not on file  . Transportation needs:    Medical: Not on file    Non-medical: Not on file  Tobacco Use  . Smoking status: Former Smoker    Packs/day: 1.00    Years: 15.00    Pack years: 15.00    Types: Cigarettes    Last attempt to quit: 2013    Years since quitting: 6.8  . Smokeless tobacco: Never Used  Substance and Sexual Activity  . Alcohol use: Yes    Alcohol/week: 0.0 standard drinks    Comment: social (4-6 drinks/month)  . Drug use: No  . Sexual activity: Yes  Lifestyle  . Physical activity:    Days per week: Not on file    Minutes per session: Not on file  . Stress: Not on file  Relationships  . Social connections:    Talks on phone: Not on file    Gets together: Not on file    Attends religious service: Not on file    Active member of club or organization: Not on file    Attends meetings of clubs or organizations: Not on file    Relationship status: Not on file  . Intimate partner violence:    Fear of current or ex partner: Not on file    Emotionally abused: Not on file    Physically abused: Not on file    Forced sexual activity: Not on file  Other Topics Concern  . Not on file  Social History Narrative  . Not on file     Family History  Problem Relation Age of Onset  . Breast  cancer Mother        dx 47s; 2nd primary at 21  . Breast cancer Paternal Grandmother        dx 4s; deceased 64s  . Breast cancer Paternal Aunt        dx 25s; currently 23s  . Breast cancer Cousin        dx 58s; currently late 28s; daughter of a paternal uncle  . Lung cancer Paternal Uncle        smoker; deceased     Current Outpatient Medications:  .  hydrochlorothiazide (MICROZIDE) 12.5 MG capsule, Take 12.5 mg by mouth daily. , Disp: , Rfl:  .  levocetirizine (XYZAL) 5 MG tablet, Take 5 mg by mouth every morning. , Disp: , Rfl:  .  norethindrone-ethinyl estradiol 1/35 (  ORTHO-NOVUM, NORTREL,CYCLAFEM) tablet, Take 1 tablet by mouth daily. , Disp: , Rfl:  .  traMADol (ULTRAM) 50 MG tablet, Take 50 mg by mouth every 4 (four) hours as needed for moderate pain., Disp: , Rfl:  .  zolpidem (AMBIEN) 5 MG tablet, Take 5 mg by mouth at bedtime., Disp: , Rfl:  .  ibuprofen (ADVIL,MOTRIN) 200 MG tablet, Take 800 mg by mouth every 6 (six) hours as needed for headache or moderate pain., Disp: , Rfl:  .  naproxen sodium (ALEVE) 220 MG tablet, Take 880 mg by mouth daily as needed (for pain or headache)., Disp: , Rfl:  .  NON FORMULARY, Place 3 drops under the tongue daily. Allergy Oral Drops made by [facility], Disp: , Rfl:  .  omeprazole (PRILOSEC OTC) 20 MG tablet, Take 20 mg by mouth every morning. , Disp: , Rfl:  .  tamsulosin (FLOMAX) 0.4 MG CAPS capsule, Take 1 capsule (0.4 mg total) by mouth daily. (Patient not taking: Reported on 06/13/2018), Disp: 30 capsule, Rfl: 0   Physical exam:  Vitals:   06/15/18 1052  BP: 117/78  Pulse: 82  Resp: 18  Temp: 97.7 F (36.5 C)  TempSrc: Tympanic  SpO2: 97%  Weight: 180 lb 4.8 oz (81.8 kg)  Height: 5' 5" (1.651 m)   Physical Exam  Constitutional: She is oriented to person, place, and time. She appears well-developed and well-nourished.  HENT:  Head: Normocephalic and atraumatic.  Eyes: Pupils are equal, round, and reactive to light. EOM  are normal.  Neck: Normal range of motion.  Cardiovascular: Normal rate, regular rhythm and normal heart sounds.  Pulmonary/Chest: Effort normal and breath sounds normal.  Abdominal: Soft. Bowel sounds are normal.  Neurological: She is alert and oriented to person, place, and time.  Skin: Skin is warm and dry.  No palpable masses in either breast.  No palpable bilateral axillary adenopathy   CMP Latest Ref Rng & Units 06/12/2018  Glucose 70 - 99 mg/dL 89  BUN 6 - 20 mg/dL 11  Creatinine 0.44 - 1.00 mg/dL 0.79  Sodium 135 - 145 mmol/L 142  Potassium 3.5 - 5.1 mmol/L 4.1  Chloride 98 - 111 mmol/L 104  CO2 22 - 32 mmol/L 28  Calcium 8.9 - 10.3 mg/dL 9.5  Total Protein 6.5 - 8.1 g/dL 7.7  Total Bilirubin 0.3 - 1.2 mg/dL 0.8  Alkaline Phos 38 - 126 U/L 66  AST 15 - 41 U/L 28  ALT 0 - 44 U/L 22   No flowsheet data found.  No images are attached to the encounter.  US Breast Ltd Uni Left Inc Axilla  Result Date: 06/01/2018 CLINICAL DATA:  Recall from screening mammography tomosynthesis, new calcifications possibly associated with a mass involving the UPPER OUTER LEFT breast at MIDDLE depth. Family history of breast cancer in her mother who completed radiation therapy approximately 2 weeks ago. EXAM: DIGITAL DIAGNOSTIC LEFT MAMMOGRAM WITH CAD AND TOMO ULTRASOUND LEFT BREAST COMPARISON:  Mammography 05/24/2018, 05/23/2017 and earlier. No prior LEFT breast ultrasound. ACR Breast Density Category c: The breast tissue is heterogeneously dense, which may obscure small masses. FINDINGS: Standard spot magnification CC and mediolateral views of the LEFT breast calcifications, tomosynthesis and synthesized spot compression CC and mediolateral views the LEFT breast calcifications, and a tomosynthesis and synthesized full field mediolateral view of the LEFT breast were obtained. Spot magnification views confirm a group of coarse and fine heterogeneous calcifications in the UPPER OUTER QUADRANT of the LEFT  breast at MIDDLE  depth which span approximately 9 x 5 x 5 mm. No discrete mass is identified on the spot compression tomosynthesis images. No suspicious findings elsewhere in the LEFT breast on the full field mediolateral tomosynthesis images. The full field mediolateral image was processed with CAD. Targeted LEFT breast ultrasound is performed, showing no mass or abnormal acoustic shadowing in the UPPER OUTER QUADRANT. IMPRESSION: Indeterminate 9 mm group of calcifications involving the UPPER OUTER QUADRANT of the LEFT breast at MIDDLE depth. There is no associated mass as questioned on screening mammography. We may have identified the calcifications at the 1 o'clock position of the LEFT breast approximately 4 cm from nipple, though I am not certain enough to suggest biopsy with ultrasound. RECOMMENDATION: 3D stereotactic tomosynthesis guided biopsy of the LEFT breast calcifications. The tomosynthesis stereotactic core needle biopsy procedure was discussed with the patient and her questions were answered. She has agreed to proceed. The Breast Navigator at [facility] will arrange for the biopsy to be scheduled and will contact the patient. I have discussed the findings and recommendations with the patient. Results were also provided in writing at the conclusion of the visit. If applicable, a reminder letter will be sent to the patient regarding the next appointment. BI-RADS CATEGORY  4: Suspicious. Electronically Signed   By: Evangeline Dakin M.D.   On: 06/01/2018 14:41   Mm Diag Breast Tomo Uni Left  Result Date: 06/01/2018 CLINICAL DATA:  Recall from screening mammography tomosynthesis, new calcifications possibly associated with a mass involving the UPPER OUTER LEFT breast at MIDDLE depth. Family history of breast cancer in her mother who completed radiation therapy approximately 2 weeks ago. EXAM: DIGITAL DIAGNOSTIC LEFT MAMMOGRAM WITH CAD AND TOMO ULTRASOUND LEFT BREAST COMPARISON:  Mammography  05/24/2018, 05/23/2017 and earlier. No prior LEFT breast ultrasound. ACR Breast Density Category c: The breast tissue is heterogeneously dense, which may obscure small masses. FINDINGS: Standard spot magnification CC and mediolateral views of the LEFT breast calcifications, tomosynthesis and synthesized spot compression CC and mediolateral views the LEFT breast calcifications, and a tomosynthesis and synthesized full field mediolateral view of the LEFT breast were obtained. Spot magnification views confirm a group of coarse and fine heterogeneous calcifications in the UPPER OUTER QUADRANT of the LEFT breast at MIDDLE depth which span approximately 9 x 5 x 5 mm. No discrete mass is identified on the spot compression tomosynthesis images. No suspicious findings elsewhere in the LEFT breast on the full field mediolateral tomosynthesis images. The full field mediolateral image was processed with CAD. Targeted LEFT breast ultrasound is performed, showing no mass or abnormal acoustic shadowing in the UPPER OUTER QUADRANT. IMPRESSION: Indeterminate 9 mm group of calcifications involving the UPPER OUTER QUADRANT of the LEFT breast at MIDDLE depth. There is no associated mass as questioned on screening mammography. We may have identified the calcifications at the 1 o'clock position of the LEFT breast approximately 4 cm from nipple, though I am not certain enough to suggest biopsy with ultrasound. RECOMMENDATION: 3D stereotactic tomosynthesis guided biopsy of the LEFT breast calcifications. The tomosynthesis stereotactic core needle biopsy procedure was discussed with the patient and her questions were answered. She has agreed to proceed. The Breast Navigator at [facility] will arrange for the biopsy to be scheduled and will contact the patient. I have discussed the findings and recommendations with the patient. Results were also provided in writing at the conclusion of the visit. If applicable, a reminder letter  will be sent to the patient regarding  the next appointment. BI-RADS CATEGORY  4: Suspicious. Electronically Signed   By: Evangeline Dakin M.D.   On: 06/01/2018 14:41   Mm 3d Screen Breast Bilateral  Result Date: 05/24/2018 CLINICAL DATA:  Screening. EXAM: DIGITAL SCREENING BILATERAL MAMMOGRAM WITH TOMO AND CAD COMPARISON:  Previous exam(s). ACR Breast Density Category c: The breast tissue is heterogeneously dense, which may obscure small masses. FINDINGS: In the left breast, a possible mass with calcifications warrants further evaluation. In the right breast, no findings suspicious for malignancy. Images were processed with CAD. IMPRESSION: Further evaluation is suggested for possible mass with calcifications in the left breast. RECOMMENDATION: Diagnostic mammogram and possibly ultrasound of the left breast. (Code:[id number]-[id number]) The patient will be contacted regarding the findings, and additional imaging will be scheduled. BI-RADS CATEGORY  0: Incomplete. Need additional imaging evaluation and/or prior mammograms for comparison. Electronically Signed   By: Dorise Bullion III M.D   On: 05/24/2018 13:11   Mm Clip Placement Left  Result Date: 06/06/2018 CLINICAL DATA:  47 year old female with indeterminate left breast calcifications. EXAM: DIAGNOSTIC LEFT MAMMOGRAM POST STEREOTACTIC BIOPSY COMPARISON:  Previous exam(s). FINDINGS: Mammographic images were obtained following stereotactic guided biopsy of the left breast. An X shaped clip is identified in the upper outer quadrant at middle depth. IMPRESSION: Post biopsy clip in the expected location. Final Assessment: Post Procedure Mammograms for Marker Placement Electronically Signed   By: Kristopher Oppenheim M.D.   On: 06/06/2018 08:53   Mm Lt Breast Bx W Loc Dev 1st Lesion Image Bx Spec Stereo Guide  Addendum Date: 06/08/2018   ADDENDUM REPORT: 06/08/2018 09:25 ADDENDUM: PATHOLOGY ADDENDUM: Pathology: High-grade DCIS, comedo type, with a small focus of possible  micro invasion. Pathology concordant with imaging findings: Yes Recommendation: Surgical consultation. Localization/excision considerations: Wire localization. These results were called by telephone at the time of interpretation on 06/08/2018 at 9:25 am to the patient, who verbally acknowledged these results. She reports doing well after the biopsy with minimal pain. The findings and recommendations were discussed with the patient. Electronically Signed   By: Fidela Salisbury M.D.   On: 06/08/2018 09:25   Result Date: 06/08/2018 CLINICAL DATA:  47 year old female with indeterminate left breast calcifications. EXAM: LEFT BREAST STEREOTACTIC CORE NEEDLE BIOPSY COMPARISON:  Previous exams. FINDINGS: The patient and I discussed the procedure of stereotactic-guided biopsy including benefits and alternatives. We discussed the high likelihood of a successful procedure. We discussed the risks of the procedure including infection, bleeding, tissue injury, clip migration, and inadequate sampling. Informed written consent was given. The usual time out protocol was performed immediately prior to the procedure. Using sterile technique and 1% Lidocaine as local anesthetic, under stereotactic guidance, a 9 gauge vacuum assisted device was used to perform core needle biopsy of calcifications in the upper-outer quadrant of the left breast using a superior approach. Specimen radiograph was performed showing calcifications in multiple specimens. Specimens with calcifications are identified for pathology. Lesion quadrant: Upper-outer quadrant. At the conclusion of the procedure, a X shaped tissue marker clip was deployed into the biopsy cavity. Follow-up 2-view mammogram was performed and dictated separately. IMPRESSION: Stereotactic-guided biopsy of left breast calcifications. No apparent complications. Electronically Signed: By: Kristopher Oppenheim M.D. On: 06/06/2018 08:53    Assessment and plan- Patient is a 47 y.o. female with  newly diagnosed left breast DCIS  I discussed the results of mammogram as well as pathology findings with the patient in detail.  Patient found to have a small focus of 9 mm high-grade DCIS.  ER PR status has not been done and would be deferred for final path.  I would recommend that she should proceed with lumpectomy and sentinel lymph node biopsy as well given the concern for microinvasive focus on her core biopsy.  If she were to have ER positive DCIS there would be a role for hormone therapy for 5 years.  I will discuss hormone therapy in more detail after her final pathology is back  Also discussed that if there was an element of invasive disease found on final pathology and if it is more than 5 mm in size there would be a role for Oncotype testing.  Briefly discussed what Oncotype testing is and how the results are interpreted.  Treatment will be given with a curative intent  I have also asked her to stop taking her birth control at this time and Dr. Concha Norway about alternative methods of contraception which would be non hormonal.  Given history of DCIS in her mother we have also checked her BRCA 1 and 2 mutation and hopefully the results will be back before surgery needed patient would like to hold off on going for surgery until these results are back.  Comprehensive genetic testing will also be sent at the same time but we do not have to wait for those results prior to her surgery.  I will see her after surgery and have her see radiation oncology on the same day   Total face to face encounter time for this patient visit was 40 min. >50% of the time was  spent in counseling and coordination of care.     Thank you for this kind referral and the opportunity to participate in the care of this patient   Visit Diagnosis 1. Goals of care, counseling/discussion   2. Ductal carcinoma in situ (DCIS) of left breast     Dr. Randa Evens, MD, MPH [facility] [phone] 06/18/2018  12:41 PM

## 2018-06-20 ENCOUNTER — Telehealth: Payer: Self-pay | Admitting: Genetic Counselor

## 2018-06-20 NOTE — Telephone Encounter (Signed)
Cancer Genetics             Telegenetics Results Disclosure   Patient Name: Tiffany Humphrey Patient DOB: [birth year] Patient Age: 47 y.o. Phone Call Date: 06/20/2018  Referring Provider: Randa Evens, MD    Tiffany Humphrey was called today to discuss genetic test results. Please see the Genetics telephone note from 06/12/2018 for a detailed discussion of her personal and family histories and the recommendations provided.  Genetic Testing: At the time of Tiffany Humphrey's telegenetics visit, she decided to pursue genetic testing of 9 genes that may be used to help guide treatment decisions. Once that test was completed, additional genes on a larger panel were analyzed. Both results were available on the same day. Testing which included sequencing and deletion/duplication analysis. Testing did not reveal a pathogenic mutation in any of the genes analyzed. A copy of the genetic test report will be scanned into Epic under the Media tab.  A Variant of Uncertain Significance was detected: BMPR1A c.[id number]>A (p.[id number]). This is still considered a normal result. While at this time, it is unknown if this finding is associated with increased cancer risk, the majority of these variants get reclassified to be inconsequential. Medical management should not be based on this finding. The lab will eventually determine the significance, if any. If it gets reclassified by the lab, she will receive a new report via their secure portal, by secure email, or by mail. Tiffany Humphrey should stay in contact with [place] on a yearly basis if she is interested in knowing the status of this result and keep her address and phone number up to date in the system.  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 Most cancers happen by chance and this test, along with details of her family history, suggests that her cancer falls into  this category.   Since the current test is not perfect, it is possible that there may be a gene mutation that current testing cannot detect, but that chance is small. It is possible that a different genetic factor, which has not yet been discovered or is not on this panel, is responsible for the cancer diagnoses in the family. Again, the likelihood of this is low. No additional testing is recommended at this time for Tiffany Humphrey.  Genes Analyzed: The genes analyzed were the 84 genes on [facility]'s Multi-Cancer panel (AIP, ALK, APC, ATM, AXIN2, BAP1, BARD1, BLM, BMPR1A, BRCA1, BRCA2, BRIP1, CASR, CDC73, CDH1, CDK4, CDKN1B, CDKN1C, CDKN2A, CEBPA, CHEK2, CTNNA1, DICER1, DIS3L2, EGFR, EPCAM, FH, FLCN, GATA2, GPC3, GREM1, HOXB13, HRAS, KIT, MAX, MEN1, MET, MITF, MLH1, MSH2, MSH3, MSH6, MUTYH, NBN, NF1, NF2, NTHL1, PALB2, PDGFRA, PHOX2B, PMS2, POLD1, POLE, POT1, PRKAR1A, PTCH1, PTEN, RAD50, RAD51C, RAD51D, RB1, RECQL4, RET, RUNX1, SDHA, SDHAF2, SDHB, SDHC, SDHD, SMAD4, SMARCA4, SMARCB1, SMARCE1, STK11, SUFU, TERC, TERT, TMEM127, TP53, TSC1, TSC2, VHL, WRN, WT1).  Cancer Screening: Tiffany Humphrey is recommended to follow the cancer screening guidelines provided by her physicians.   Family Members: Family members are at some increased risk of developing cancer, over the general population risk, simply due to the family history. Given the young age of breast cancer in the family, women are recommended to speak with their own providers about having a yearly mammogram beginning at age 24, which is 10 years earlier than the youngest breast  cancer in the family and discuss with their own provider whether there is a benefit to adding tomosynthesis to the mammogram and a breast MRI. Women are recommended to also have a yearly clinical breast exam, a yearly gynecologic exam and perform monthly breast self-exams. Colon cancer screening is recommended to begin by age 46 in both men and women, unless there is a family history of  colon cancer or colon polyps or an individual has a personal history to warrant initiating screening at a younger age.  Any relative who had cancer at a young age or had a particularly rare cancer may also wish to pursue genetic testing. Genetic counselors can be located in other cities, by visiting the website of the [employer] ([employer]) and Field seismologist for a Dietitian by zip code.   Family members are not recommended to get tested for the above VUS outside of a research protocol as this finding has no implications for their medical management.    Follow-Up: Cancer genetics is a rapidly advancing field and it is possible that new genetic tests will be appropriate for Tiffany Humphrey in the future. Tiffany Humphrey is encouraged to remain in contact with Genetics on an annual basis so we can update her personal and family histories, and let her know of advances in cancer genetics that may benefit the family. Tiffany Humphrey questions were answered to her satisfaction today, and she knows she is welcome to call anytime with additional questions.    Steele Berg, MS, [place] Certified Genetic Counselor phone: [phone]

## 2018-06-21 ENCOUNTER — Encounter: Payer: Self-pay | Admitting: *Deleted

## 2018-06-21 ENCOUNTER — Other Ambulatory Visit: Payer: Self-pay | Admitting: *Deleted

## 2018-06-21 MED ORDER — CEFAZOLIN SODIUM-DEXTROSE 2-4 GM/100ML-% IV SOLN
2.0000 g | INTRAVENOUS | Status: AC
  Administered 2018-06-22: 2 g via INTRAVENOUS

## 2018-06-22 ENCOUNTER — Other Ambulatory Visit: Payer: Self-pay

## 2018-06-22 ENCOUNTER — Ambulatory Visit: Payer: Managed Care, Other (non HMO) | Admitting: Certified Registered Nurse Anesthetist

## 2018-06-22 ENCOUNTER — Ambulatory Visit
Admission: RE | Admit: 2018-06-22 | Discharge: 2018-06-22 | Disposition: A | Discharge status: Home / Self Care | Payer: Managed Care, Other (non HMO) | Source: Ambulatory Visit | Attending: General Surgery | Admitting: General Surgery

## 2018-06-22 ENCOUNTER — Encounter
Admission: RE | Disposition: A | Discharge status: Home / Self Care | Payer: Self-pay | Source: Ambulatory Visit | Attending: General Surgery

## 2018-06-22 DIAGNOSIS — F419 Anxiety disorder, unspecified: Secondary | ICD-10-CM | POA: Insufficient documentation

## 2018-06-22 DIAGNOSIS — Z79899 Other long term (current) drug therapy: Secondary | ICD-10-CM | POA: Insufficient documentation

## 2018-06-22 DIAGNOSIS — Z885 Allergy status to narcotic agent status: Secondary | ICD-10-CM | POA: Insufficient documentation

## 2018-06-22 DIAGNOSIS — K219 Gastro-esophageal reflux disease without esophagitis: Secondary | ICD-10-CM | POA: Insufficient documentation

## 2018-06-22 DIAGNOSIS — D0512 Intraductal carcinoma in situ of left breast: Secondary | ICD-10-CM | POA: Diagnosis not present

## 2018-06-22 DIAGNOSIS — C50412 Malignant neoplasm of upper-outer quadrant of left female breast: Secondary | ICD-10-CM

## 2018-06-22 DIAGNOSIS — Z87891 Personal history of nicotine dependence: Secondary | ICD-10-CM | POA: Insufficient documentation

## 2018-06-22 DIAGNOSIS — Z803 Family history of malignant neoplasm of breast: Secondary | ICD-10-CM | POA: Insufficient documentation

## 2018-06-22 HISTORY — PX: SENTINEL NODE BIOPSY: SHX6608

## 2018-06-22 HISTORY — PX: PARTIAL MASTECTOMY WITH NEEDLE LOCALIZATION: SHX6008

## 2018-06-22 HISTORY — PX: BREAST LUMPECTOMY: SHX2

## 2018-06-22 HISTORY — PX: BREAST EXCISIONAL BIOPSY: SUR124

## 2018-06-22 LAB — POCT PREGNANCY, URINE: Preg Test, Ur: NEGATIVE

## 2018-06-22 SURGERY — PARTIAL MASTECTOMY WITH NEEDLE LOCALIZATION
Anesthesia: General | Site: Breast | Laterality: Left

## 2018-06-22 MED ORDER — BUPIVACAINE-EPINEPHRINE 0.5% -1:200000 IJ SOLN
INTRAMUSCULAR | Status: DC | PRN
  Administered 2018-06-22: 30 mL

## 2018-06-22 MED ORDER — FENTANYL CITRATE (PF) 100 MCG/2ML IJ SOLN
INTRAMUSCULAR | Status: AC
  Filled 2018-06-22: qty 2

## 2018-06-22 MED ORDER — TRAMADOL HCL 50 MG PO TABS
ORAL_TABLET | ORAL | Status: AC
  Filled 2018-06-22: qty 1

## 2018-06-22 MED ORDER — GLYCOPYRROLATE 0.2 MG/ML IJ SOLN
INTRAMUSCULAR | Status: DC | PRN
  Administered 2018-06-22: 0.1 mg via INTRAVENOUS

## 2018-06-22 MED ORDER — PROMETHAZINE HCL 12.5 MG PO TABS: 12.5000 mg | ORAL_TABLET | Freq: Four times a day (QID) | ORAL | 0 refills | Status: DC | PRN

## 2018-06-22 MED ORDER — MEPERIDINE HCL 50 MG/ML IJ SOLN: 6.2500 mg | INTRAMUSCULAR | Status: DC | PRN

## 2018-06-22 MED ORDER — KETOROLAC TROMETHAMINE 30 MG/ML IJ SOLN
INTRAMUSCULAR | Status: AC
  Filled 2018-06-22: qty 1

## 2018-06-22 MED ORDER — FENTANYL CITRATE (PF) 100 MCG/2ML IJ SOLN: 50.0000 ug | INTRAMUSCULAR | Status: AC

## 2018-06-22 MED ORDER — SCOPOLAMINE 1 MG/3DAYS TD PT72
1.0000 | MEDICATED_PATCH | Freq: Once | TRANSDERMAL | Status: DC
  Administered 2018-06-22: 1.5 mg via TRANSDERMAL

## 2018-06-22 MED ORDER — LIDOCAINE HCL (PF) 2 % IJ SOLN
INTRAMUSCULAR | Status: AC
  Filled 2018-06-22: qty 10

## 2018-06-22 MED ORDER — ONDANSETRON HCL 4 MG/2ML IJ SOLN
INTRAMUSCULAR | Status: DC | PRN
  Administered 2018-06-22: 4 mg via INTRAVENOUS

## 2018-06-22 MED ORDER — BUPIVACAINE-EPINEPHRINE (PF) 0.5% -1:200000 IJ SOLN
INTRAMUSCULAR | Status: AC
  Filled 2018-06-22: qty 30

## 2018-06-22 MED ORDER — SCOPOLAMINE 1 MG/3DAYS TD PT72
MEDICATED_PATCH | TRANSDERMAL | Status: AC
  Filled 2018-06-22: qty 1

## 2018-06-22 MED ORDER — OXYCODONE HCL 5 MG PO TABS: 5.0000 mg | ORAL_TABLET | Freq: Once | ORAL | Status: DC | PRN

## 2018-06-22 MED ORDER — OXYCODONE HCL 5 MG/5ML PO SOLN: 5.0000 mg | Freq: Once | ORAL | Status: DC | PRN

## 2018-06-22 MED ORDER — FENTANYL CITRATE (PF) 100 MCG/2ML IJ SOLN
25.0000 ug | INTRAMUSCULAR | Status: DC | PRN
  Administered 2018-06-22 (×4): 50 ug via INTRAVENOUS

## 2018-06-22 MED ORDER — LIDOCAINE HCL (CARDIAC) PF 100 MG/5ML IV SOSY
PREFILLED_SYRINGE | INTRAVENOUS | Status: DC | PRN
  Administered 2018-06-22: 100 mg via INTRAVENOUS

## 2018-06-22 MED ORDER — TRAMADOL HCL 50 MG PO TABS
50.0000 mg | ORAL_TABLET | Freq: Four times a day (QID) | ORAL | Status: DC | PRN
  Administered 2018-06-22: 50 mg via ORAL

## 2018-06-22 MED ORDER — TRAMADOL HCL 50 MG PO TABS: 50.0000 mg | ORAL_TABLET | Freq: Four times a day (QID) | ORAL | 0 refills | Status: AC | PRN

## 2018-06-22 MED ORDER — FENTANYL CITRATE (PF) 100 MCG/2ML IJ SOLN
INTRAMUSCULAR | Status: AC
  Administered 2018-06-22: 50 ug
  Filled 2018-06-22: qty 2

## 2018-06-22 MED ORDER — FENTANYL CITRATE (PF) 100 MCG/2ML IJ SOLN
INTRAMUSCULAR | Status: DC | PRN
  Administered 2018-06-22 (×2): 25 ug via INTRAVENOUS
  Administered 2018-06-22: 50 ug via INTRAVENOUS
  Administered 2018-06-22 (×4): 25 ug via INTRAVENOUS
  Administered 2018-06-22: 50 ug via INTRAVENOUS
  Administered 2018-06-22 (×2): 25 ug via INTRAVENOUS

## 2018-06-22 MED ORDER — MIDAZOLAM HCL 2 MG/2ML IJ SOLN
INTRAMUSCULAR | Status: DC | PRN
  Administered 2018-06-22: 2 mg via INTRAVENOUS

## 2018-06-22 MED ORDER — PROPOFOL 10 MG/ML IV BOLUS
INTRAVENOUS | Status: DC | PRN
  Administered 2018-06-22: 200 mg via INTRAVENOUS

## 2018-06-22 MED ORDER — KETOROLAC TROMETHAMINE 30 MG/ML IJ SOLN
INTRAMUSCULAR | Status: DC | PRN
  Administered 2018-06-22: 30 mg via INTRAVENOUS

## 2018-06-22 MED ORDER — SODIUM CHLORIDE FLUSH 0.9 % IV SOLN
INTRAVENOUS | Status: AC
  Filled 2018-06-22: qty 10

## 2018-06-22 MED ORDER — ACETAMINOPHEN 10 MG/ML IV SOLN
INTRAVENOUS | Status: DC | PRN
  Administered 2018-06-22: 1000 mg via INTRAVENOUS

## 2018-06-22 MED ORDER — PROMETHAZINE HCL 25 MG/ML IJ SOLN: 6.2500 mg | INTRAMUSCULAR | Status: DC | PRN

## 2018-06-22 MED ORDER — DEXAMETHASONE SODIUM PHOSPHATE 10 MG/ML IJ SOLN
INTRAMUSCULAR | Status: DC | PRN
  Administered 2018-06-22: 10 mg via INTRAVENOUS

## 2018-06-22 MED ORDER — ONDANSETRON HCL 4 MG/2ML IJ SOLN
INTRAMUSCULAR | Status: AC
  Filled 2018-06-22: qty 2

## 2018-06-22 MED ORDER — NABUMETONE 500 MG PO TABS: 500.0000 mg | ORAL_TABLET | Freq: Two times a day (BID) | ORAL | 0 refills | Status: AC

## 2018-06-22 MED ORDER — PHENYLEPHRINE HCL 10 MG/ML IJ SOLN
INTRAMUSCULAR | Status: DC | PRN
  Administered 2018-06-22: 100 ug via INTRAVENOUS

## 2018-06-22 MED ORDER — ACETAMINOPHEN 10 MG/ML IV SOLN
INTRAVENOUS | Status: AC
  Filled 2018-06-22: qty 100

## 2018-06-22 MED ORDER — LACTATED RINGERS IV SOLN
INTRAVENOUS | Status: DC
  Administered 2018-06-22 (×2): via INTRAVENOUS

## 2018-06-22 MED ORDER — GABAPENTIN 300 MG PO CAPS: 300.0000 mg | ORAL_CAPSULE | Freq: Three times a day (TID) | ORAL | 0 refills | Status: DC

## 2018-06-22 MED ORDER — DEXAMETHASONE SODIUM PHOSPHATE 10 MG/ML IJ SOLN
INTRAMUSCULAR | Status: AC
  Filled 2018-06-22: qty 1

## 2018-06-22 MED ORDER — PROPOFOL 10 MG/ML IV BOLUS
INTRAVENOUS | Status: AC
  Filled 2018-06-22: qty 20

## 2018-06-22 MED ORDER — TECHNETIUM TC 99M SULFUR COLLOID FILTERED
1.0000 | Freq: Once | INTRAVENOUS | Status: AC | PRN
  Administered 2018-06-22: 0.95 via INTRADERMAL

## 2018-06-22 MED ORDER — MIDAZOLAM HCL 2 MG/2ML IJ SOLN
INTRAMUSCULAR | Status: AC
  Filled 2018-06-22: qty 2

## 2018-06-22 MED ORDER — CEFAZOLIN SODIUM-DEXTROSE 2-4 GM/100ML-% IV SOLN
INTRAVENOUS | Status: AC
  Filled 2018-06-22: qty 100

## 2018-06-22 SURGICAL SUPPLY — 39 items
CANISTER SUCT 1200ML W/VALVE (MISCELLANEOUS) ×3 IMPLANT
CHLORAPREP W/TINT 26ML (MISCELLANEOUS) ×3 IMPLANT
CNTNR SPEC 2.5X3XGRAD LEK (MISCELLANEOUS) ×2
CONT SPEC 4OZ STER OR WHT (MISCELLANEOUS) ×4
CONTAINER SPEC 2.5X3XGRAD LEK (MISCELLANEOUS) ×2 IMPLANT
COVER WAND RF STERILE (DRAPES) ×3 IMPLANT
DERMABOND ADVANCED (GAUZE/BANDAGES/DRESSINGS) ×2
DERMABOND ADVANCED .7 DNX12 (GAUZE/BANDAGES/DRESSINGS) ×1 IMPLANT
DEVICE DUBIN SPECIMEN MAMMOGRA (MISCELLANEOUS) ×3 IMPLANT
DRAPE LAPAROTOMY 77X122 PED (DRAPES) ×3 IMPLANT
DRAPE LAPAROTOMY TRNSV 106X77 (MISCELLANEOUS) ×3 IMPLANT
ELECT REM PT RETURN 9FT ADLT (ELECTROSURGICAL) ×3
ELECTRODE REM PT RTRN 9FT ADLT (ELECTROSURGICAL) ×1 IMPLANT
GLOVE BIO SURGEON STRL SZ 6.5 (GLOVE) ×2 IMPLANT
GLOVE BIO SURGEONS STRL SZ 6.5 (GLOVE) ×1
GLOVE BIOGEL M 6.5 STRL (GLOVE) ×3 IMPLANT
GOWN STRL REUS W/ TWL LRG LVL3 (GOWN DISPOSABLE) ×2 IMPLANT
GOWN STRL REUS W/TWL LRG LVL3 (GOWN DISPOSABLE) ×4
KIT TURNOVER KIT A (KITS) ×3 IMPLANT
LABEL OR SOLS (LABEL) ×3 IMPLANT
MARGIN MAP 10MM (MISCELLANEOUS) ×3 IMPLANT
NDL SAFETY ECLIPSE 18X1.5 (NEEDLE) ×1 IMPLANT
NEEDLE HYPO 18GX1.5 SHARP (NEEDLE) ×2
NEEDLE HYPO 22GX1.5 SAFETY (NEEDLE) ×3 IMPLANT
NEEDLE HYPO 25X1 1.5 SAFETY (NEEDLE) ×3 IMPLANT
PACK BASIN MINOR ARMC (MISCELLANEOUS) ×3 IMPLANT
RETRACTOR WOUND ALXS 18CM SML (MISCELLANEOUS) ×1 IMPLANT
RTRCTR WOUND ALEXIS O 18CM SML (MISCELLANEOUS) ×3
SLEVE PROBE SENORX GAMMA FIND (MISCELLANEOUS) ×3 IMPLANT
SUT ETHILON 3-0 FS-10 30 BLK (SUTURE) ×3
SUT MNCRL 4-0 (SUTURE) ×2
SUT MNCRL 4-0 27XMFL (SUTURE) ×1
SUT SILK 2 0 SH (SUTURE) IMPLANT
SUT VIC AB 3-0 SH 27 (SUTURE) ×2
SUT VIC AB 3-0 SH 27X BRD (SUTURE) ×1 IMPLANT
SUTURE EHLN 3-0 FS-10 30 BLK (SUTURE) ×1 IMPLANT
SUTURE MNCRL 4-0 27XMF (SUTURE) ×1 IMPLANT
SYR 10ML LL (SYRINGE) ×3 IMPLANT
WATER STERILE IRR 1000ML POUR (IV SOLUTION) ×3 IMPLANT

## 2018-06-22 NOTE — Discharge Instructions (Signed)
  Diet: Resume home heart healthy regular diet.   Activity: increase activity as tolerated, but light activity and walking are encouraged. Do not drive or drink alcohol if taking narcotic pain medications.  Wound care: May shower with soapy water and pat dry (do not rub incisions), but no baths or submerging incision underwater until follow-up. (no swimming)   Medications: Resume all home medications. For mild to moderate pain: acetaminophen (Tylenol) or ibuprofen (if no kidney disease). Combining Tylenol with alcohol can substantially increase your risk of causing liver disease. Narcotic pain medications, if prescribed, can be used for severe pain, though may cause nausea, constipation, and drowsiness. If you do not need the narcotic pain medication, you do not need to fill the prescription.  Call office [phone]) at any time if any questions, worsening pain, fevers/chills, bleeding, drainage from incision site, or other concerns.  AMBULATORY SURGERY  DISCHARGE INSTRUCTIONS   1) The drugs that you were given will stay in your system until tomorrow so for the next 24 hours you should not:  A) Drive an automobile B) Make any legal decisions C) Drink any alcoholic beverage   2) You may resume regular meals tomorrow.  Today it is better to start with liquids and gradually work up to solid foods.  You may eat anything you prefer, but it is better to start with liquids, then soup and crackers, and gradually work up to solid foods.   3) Please notify your doctor immediately if you have any unusual bleeding, trouble breathing, redness and pain at the surgery site, drainage, fever, or pain not relieved by medication.    4) Additional Instructions: TAKE A STOOL SOFTENER TWICE A DAY WHILE TAKING NARCOTIC PAIN MEDICINE TO PREVENT CONSTIPATION   Please contact your physician with any problems or Same Day Surgery at [phone], Monday through Friday 6 am to 4 pm, or [place] at [facility] number at [phone].

## 2018-06-22 NOTE — Interval H&P Note (Signed)
History and Physical Interval Note:  06/22/2018 11:43 AM  Tiffany Humphrey  has presented today for surgery, with the diagnosis of malignant neoplasm of left breast  The various methods of treatment have been discussed with the patient and family. After consideration of risks, benefits and other options for treatment, the patient has consented to  Procedure(s): PARTIAL MASTECTOMY WITH NEEDLE LOCALIZATION (Left) SENTINEL NODE BIOPSY (Left) as a surgical intervention .  The patient's history has been reviewed, patient examined, no change in status, stable for surgery.  I have reviewed the patient's chart and labs.  Left side marked in the pre procedure room. Questions were answered to the patient's satisfaction.     Herbert Pun

## 2018-06-22 NOTE — Anesthesia Postprocedure Evaluation (Signed)
Anesthesia Post Note  Patient: Kiri A Fullwood  Procedure(s) Performed: PARTIAL MASTECTOMY WITH NEEDLE LOCALIZATION (Left Breast) SENTINEL NODE BIOPSY (Left Breast)  Patient location during evaluation: PACU Anesthesia Type: General Level of consciousness: awake and alert and oriented Pain management: pain level controlled Vital Signs Assessment: post-procedure vital signs reviewed and stable Respiratory status: spontaneous breathing, nonlabored ventilation and respiratory function stable Cardiovascular status: blood pressure returned to baseline and stable Postop Assessment: no signs of nausea or vomiting Anesthetic complications: no     Last Vitals:  Vitals:   06/22/18 1445 06/22/18 1450  BP: 106/66   Pulse: 99 97  Resp: 19 19  Temp:    SpO2: 94% 94%    Last Pain:  Vitals:   06/22/18 1450  TempSrc:   PainSc: 8                  Falisa Lamora

## 2018-06-22 NOTE — Progress Notes (Signed)
Discharge instructions and pt and family state understanding

## 2018-06-22 NOTE — Op Note (Signed)
Preoperative diagnosis: Left breast carcinoma (DCIS).  Postoperative diagnosis: Left breast carcinoma (DCIS).   Procedure: Left needle-localized breast partial mastectomy.                       Left Axillary Sentinel Lymph node biopsy  Anesthesia: GETA  Surgeon: Dr. Windell Moment  Wound Classification: Clean  Indications: Patient is a 47 y.o. 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    Findings: 1. Specimen mammography shows marker and wire on specimen 2. Pathology call refers gross examination of margins was grossly clear. 3. No other palpable mass or lymph node identified.   Description of procedure: Preoperative needle localization was performed by radiology. In the nuclear medicine suite, the subareolar region was injected with Tc-99 sulfur colloid. Localization studies were reviewed. The patient was taken to the operating room and placed supine on the operating table, and after general anesthesia the left chest and axilla were prepped and draped in the usual sterile fashion. A time-out was completed verifying correct patient, procedure, site, positioning, and implant(s) and/or special equipment prior to beginning this procedure.  By comparing the localization studies with the direction and skin entry site of the needle, the probable trajectory and location of the mass was visualized. A circumareolar skin incision was planned in such a way as to minimize the amount of dissection to reach the mass.  The skin incision was made. Flaps were raised and the location of the wire confirmed. The wire was delivered into the wound. A 2-0 silk  figure-of-eight stay suture was placed around the wire and used for retraction. Dissection was then taken down circumferentially, taking care to include the entire localizing needle and a wide margin of grossly normal tissue. The specimen and entire localizing wire were removed. The specimen was oriented and sent to radiology with the localization studies. Confirmation was received that the entire target lesion had been resected. The wound was irrigated. Hemostasis was checked. The wound was closed with interrupted sutures of 3-0 Vicryl and a subcuticular suture of Monocryl 3-0. No attempt was made to close the dead space. A dressing was applied.   A hand-held gamma probe was used to identify the location of the hottest spot in the axilla. An incision was made around the caudal axillary hairline. Dissection was carried down until subdermal facias was advanced. The probe was placed and again, the point of maximal count was found. Dissection continue until nodule was identified. The node was excised in its entirety. Ex vivo, the node measured 1347 counts when placed on the probe. An additional hot spot was detected and the node was excised in similar fashion. The following counts registered:  566 ex vivo node, and 4 residual bed. No additional hot spots were identified. No clinically abnormal nodes were palpated. The procedure was terminated. Hemostasis was achieved and the wound closed in layers with deep interrupted 3-0 Vicryl and skin was closed with subcuticular suture of Monocryl 3-0.  The patient tolerated the procedure well and was taken to the postanesthesia care unit in stable condition.   Specimen: Left Breast mass (Orientation markers used: Cranial, Caudal, Medial, Deep)  Sentinel Lymph nodes #1, #2  Complications: None  Estimated Blood Loss: 30 mL

## 2018-06-22 NOTE — Anesthesia Preprocedure Evaluation (Signed)
Anesthesia Evaluation  Patient identified by MRN, date of birth, ID band Patient awake    Reviewed: Allergy & Precautions, NPO status , Patient's Chart, lab work & pertinent test results  History of Anesthesia Complications (+) PONV and history of anesthetic complications  Airway Mallampati: II  TM Distance: >3 FB Neck ROM: Full    Dental no notable dental hx.    Pulmonary neg sleep apnea, neg COPD, former smoker,    breath sounds clear to auscultation- rhonchi (-) wheezing      Cardiovascular Exercise Tolerance: Good (-) hypertension(-) CAD, (-) Past MI, (-) Cardiac Stents and (-) CABG  Rhythm:Regular Rate:Normal - Systolic murmurs and - Diastolic murmurs    Neuro/Psych  Headaches, Anxiety    GI/Hepatic Neg liver ROS, GERD  ,  Endo/Other  negative endocrine ROSneg diabetes  Renal/GU negative Renal ROS     Musculoskeletal  (+) Arthritis ,   Abdominal (+) + obese,   Peds  Hematology negative hematology ROS (+)   Anesthesia Other Findings Past Medical History: No date: Anxiety No date: Cancer ([place])     Comment:  LEFT BREAST  No date: Complication of anesthesia No date: GERD (gastroesophageal reflux disease) No date: Headache     Comment:  migraines/stress, optical migraines No date: History of kidney stones No date: Multilevel degenerative disc disease No date: PONV (postoperative nausea and vomiting) No date: Vertigo     Comment:  last episode 10/2016-NO PROBLEMS SINCE SINUS SURGERY IN               2018 No date: Wears contact lenses   Reproductive/Obstetrics                             Anesthesia Physical Anesthesia Plan  ASA: II  Anesthesia Plan: General   Post-op Pain Management:    Induction: Intravenous  PONV Risk Score and Plan: 3 and Ondansetron, Dexamethasone, Scopolamine patch - Pre-op and Midazolam  Airway Management Planned: LMA  Additional Equipment:    Intra-op Plan:   Post-operative Plan:   Informed Consent: I have reviewed the patients History and Physical, chart, labs and discussed the procedure including the risks, benefits and alternatives for the proposed anesthesia with the patient or authorized representative who has indicated his/her understanding and acceptance.   Dental advisory given  Plan Discussed with: CRNA and Anesthesiologist  Anesthesia Plan Comments:         Anesthesia Quick Evaluation

## 2018-06-22 NOTE — Anesthesia Post-op Follow-up Note (Signed)
Anesthesia QCDR form completed.        

## 2018-06-22 NOTE — Anesthesia Procedure Notes (Signed)
Procedure Name: LMA Insertion Performed by: Elinora Weigand, CRNA Pre-anesthesia Checklist: Patient identified, Patient being monitored, Timeout performed, Emergency Drugs available and Suction available Patient Re-evaluated:Patient Re-evaluated prior to induction Oxygen Delivery Method: Circle system utilized Preoxygenation: Pre-oxygenation with 100% oxygen Induction Type: IV induction Ventilation: Mask ventilation without difficulty LMA: LMA inserted LMA Size: 3.5 Tube type: Oral Number of attempts: 1 Placement Confirmation: positive ETCO2 and breath sounds checked- equal and bilateral Tube secured with: Tape Dental Injury: Teeth and Oropharynx as per pre-operative assessment        

## 2018-06-22 NOTE — Transfer of Care (Signed)
Immediate Anesthesia Transfer of Care Note  Patient: Tiffany Humphrey  Procedure(s) Performed: PARTIAL MASTECTOMY WITH NEEDLE LOCALIZATION (Left Breast) SENTINEL NODE BIOPSY (Left Breast)  Patient Location: PACU  Anesthesia Type:General  Level of Consciousness: awake and alert   Airway & Oxygen Therapy: Patient Spontanous Breathing and Patient connected to face mask oxygen  Post-op Assessment: Report given to RN and Post -op Vital signs reviewed and stable  Post vital signs: Reviewed and stable  Last Vitals:  Vitals Value Taken Time  BP 108/80   Temp    Pulse 125 06/22/2018  1:57 PM  Resp 18   SpO2 100 % 06/22/2018  1:57 PM  Vitals shown include unvalidated device data.  Last Pain:  Vitals:   06/22/18 0937  TempSrc: Temporal  PainSc: 0-No pain         Complications: No apparent anesthesia complications

## 2018-06-23 ENCOUNTER — Encounter: Payer: Self-pay | Admitting: General Surgery

## 2018-06-26 ENCOUNTER — Other Ambulatory Visit: Payer: Self-pay | Admitting: Oncology

## 2018-06-28 LAB — SURGICAL PATHOLOGY

## 2018-06-29 ENCOUNTER — Encounter: Payer: Self-pay | Admitting: Radiation Oncology

## 2018-06-29 ENCOUNTER — Ambulatory Visit
Admission: RE | Admit: 2018-06-29 | Discharge: 2018-06-29 | Disposition: A | Discharge status: Home / Self Care | Payer: Managed Care, Other (non HMO) | Source: Ambulatory Visit | Attending: Radiation Oncology | Admitting: Radiation Oncology

## 2018-06-29 ENCOUNTER — Encounter: Payer: Self-pay | Admitting: Oncology

## 2018-06-29 ENCOUNTER — Inpatient Hospital Stay: Payer: Managed Care, Other (non HMO) | Attending: Oncology | Admitting: Oncology

## 2018-06-29 ENCOUNTER — Other Ambulatory Visit: Payer: Self-pay

## 2018-06-29 ENCOUNTER — Encounter: Payer: Self-pay | Admitting: *Deleted

## 2018-06-29 VITALS — BP 115/78 | HR 91 | Temp 97.6°F | Resp 18 | Ht 65.0 in | Wt 178.4 lb

## 2018-06-29 DIAGNOSIS — C50412 Malignant neoplasm of upper-outer quadrant of left female breast: Secondary | ICD-10-CM

## 2018-06-29 DIAGNOSIS — Z79899 Other long term (current) drug therapy: Secondary | ICD-10-CM | POA: Insufficient documentation

## 2018-06-29 DIAGNOSIS — Z853 Personal history of malignant neoplasm of breast: Secondary | ICD-10-CM

## 2018-06-29 DIAGNOSIS — D0512 Intraductal carcinoma in situ of left breast: Secondary | ICD-10-CM | POA: Insufficient documentation

## 2018-06-29 DIAGNOSIS — F419 Anxiety disorder, unspecified: Secondary | ICD-10-CM | POA: Diagnosis not present

## 2018-06-29 DIAGNOSIS — Z87891 Personal history of nicotine dependence: Secondary | ICD-10-CM | POA: Diagnosis not present

## 2018-06-29 DIAGNOSIS — Z87442 Personal history of urinary calculi: Secondary | ICD-10-CM | POA: Insufficient documentation

## 2018-06-29 DIAGNOSIS — K219 Gastro-esophageal reflux disease without esophagitis: Secondary | ICD-10-CM | POA: Insufficient documentation

## 2018-06-29 DIAGNOSIS — Z170 Estrogen receptor positive status [ER+]: Secondary | ICD-10-CM | POA: Diagnosis not present

## 2018-06-29 DIAGNOSIS — C50919 Malignant neoplasm of unspecified site of unspecified female breast: Secondary | ICD-10-CM

## 2018-06-29 DIAGNOSIS — Z7189 Other specified counseling: Secondary | ICD-10-CM

## 2018-06-29 HISTORY — DX: Personal history of malignant neoplasm of breast: Z85.3

## 2018-06-29 HISTORY — DX: Malignant neoplasm of unspecified site of unspecified female breast: C50.919

## 2018-06-29 NOTE — Consult Note (Signed)
NEW PATIENT EVALUATION  Name: Tiffany Humphrey  MRN: [id number]  Date:   06/29/2018     DOB: June 21, 1971   This 47 y.o. 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  REFERRING PHYSICIAN: Juline Patch, MD  CHIEF COMPLAINT:  Chief Complaint  Patient presents with  . Breast Cancer    Initial Eval    DIAGNOSIS: The encounter diagnosis was Ductal carcinoma in situ (DCIS) of left breast.   PREVIOUS INVESTIGATIONS:  Mammogram and ultrasound reviewed Clinical notes reviewed Pathology report reviewed  HPI: patient is a 47 year old female who presented with an abnormal mammogram of her left breast showing a group of coarse and fine heterogeneous calcifications in the upper-outer quadrant measuring 9 mm in greatest dimension.no associated mass was seen.she underwent 3-D stereotactic biopsy of the left breast showing ductal carcinoma in situ high-grade with comedonecrosis.there was 1 area of microinvasion possibly. She underwent wide local excision showing 5 mm of invasive mammary carcinoma overall grade 2. There was also high-grade ductal carcinoma in situnoted with extensive intraductal component. Margins were clear at 4 mm for both invasive and DCIS component. 2 sentinel lymph nodes were evaluated and negative for malignancy.tumor was ER/PR positive HER-2/neu not overexpressed. Oncotype DX has been sent off for evaluation. She seen today is still somewhat sore from her surgery although doing well. She specifically denies cough or bone pain.  PLANNED TREATMENT REGIMEN: whole breast radiation with antiestrogen therapy after completion and possible systemic chemotherapy depending on Oncotype DX  PAST MEDICAL HISTORY:  has a past medical history of Anxiety, Cancer ([place]), Complication of anesthesia, GERD (gastroesophageal reflux disease),  Headache, History of kidney stones, Multilevel degenerative disc disease, PONV (postoperative nausea and vomiting), Vertigo, and Wears contact lenses.    PAST SURGICAL HISTORY:  Past Surgical History:  Procedure Laterality Date  . BREAST BIOPSY Right 06/08/2016   CYSTIC PAPILLARY APOCRINE METAPLASIA.   Marland Kitchen BREAST BIOPSY Left 06/06/2018   left breast stereo X clip positive  . BREAST EXCISIONAL BIOPSY Left 06/22/2018   lumpectomy with sn and nl  . BREAST LUMPECTOMY Left 06/22/2018  . FRONTAL SINUS EXPLORATION Bilateral 12/22/2016   Procedure: FRONTAL SINUS EXPLORATION;  Surgeon: Margaretha Sheffield, MD;  Location: [place];  Service: ENT;  Laterality: Bilateral;  . IMAGE GUIDED SINUS SURGERY N/A 12/22/2016   Procedure: IMAGE GUIDED SINUS SURGERY;  Surgeon: Margaretha Sheffield, MD;  Location: [place];  Service: ENT;  Laterality: N/A;  . MAXILLARY ANTROSTOMY Bilateral 12/22/2016   Procedure: MAXILLARY ANTROSTOMY;  Surgeon: Margaretha Sheffield, MD;  Location: [place];  Service: ENT;  Laterality: Bilateral;  . PARTIAL MASTECTOMY WITH NEEDLE LOCALIZATION Left 06/22/2018   Procedure: PARTIAL MASTECTOMY WITH NEEDLE LOCALIZATION;  Surgeon: Herbert Pun, MD;  Location: [facility];  Service: General;  Laterality: Left;  . SENTINEL NODE BIOPSY Left 06/22/2018   Procedure: SENTINEL NODE BIOPSY;  Surgeon: Herbert Pun, MD;  Location: [facility];  Service: General;  Laterality: Left;  . SEPTOPLASTY N/A 12/22/2016   Procedure: SEPTOPLASTY;  Surgeon: Margaretha Sheffield, MD;  Location: [place];  Service: ENT;  Laterality: N/A;  GAVE DISK TO [facility] 4-5 KP  . TURBINATE REDUCTION N/A 12/22/2016   Procedure: TURBINATE REDUCTION partial inferior;  Surgeon: Margaretha Sheffield, MD;  Location: [place];  Service: ENT;  Laterality: N/A;    FAMILY HISTORY: family history  includes Breast cancer in her cousin, mother, paternal aunt, and paternal grandmother; Lung cancer in her paternal  uncle.  SOCIAL HISTORY:  reports that she quit smoking about 6 years ago. Her smoking use included cigarettes. She has a 15.00 pack-year smoking history. She has never used smokeless tobacco. She reports that she drinks alcohol. She reports that she does not use drugs.  ALLERGIES: Quinolones; Clindamycin; and Codeine  MEDICATIONS:  Current Outpatient Medications  Medication Sig Dispense Refill  . gabapentin (NEURONTIN) 300 MG capsule Take 1 capsule (300 mg total) by mouth 3 (three) times daily for 14 days. 42 capsule 0  . hydrochlorothiazide (MICROZIDE) 12.5 MG capsule Take 12.5 mg by mouth daily.     Marland Kitchen ibuprofen (ADVIL,MOTRIN) 200 MG tablet Take 800 mg by mouth every 6 (six) hours as needed for headache or moderate pain.    Marland Kitchen levocetirizine (XYZAL) 5 MG tablet Take 5 mg by mouth every morning.     . nabumetone (RELAFEN) 500 MG tablet Take 1 tablet (500 mg total) by mouth 2 (two) times daily for 10 days. 20 tablet 0  . naproxen sodium (ALEVE) 220 MG tablet Take 880 mg by mouth daily as needed (for pain or headache).    . NON FORMULARY Place 3 drops under the tongue at bedtime. Allergy Oral Drops made by [facility]     . norethindrone-ethinyl estradiol 1/35 (ORTHO-NOVUM, NORTREL,CYCLAFEM) tablet Take 1 tablet by mouth daily.     Marland Kitchen omeprazole (PRILOSEC OTC) 20 MG tablet Take 20 mg by mouth every morning.     . promethazine (PHENERGAN) 12.5 MG tablet Take 1 tablet (12.5 mg total) by mouth every 6 (six) hours as needed for up to 5 days for nausea or vomiting. 10 tablet 0  . tamsulosin (FLOMAX) 0.4 MG CAPS capsule Take 1 capsule (0.4 mg total) by mouth daily. (Patient not taking: Reported on 06/13/2018) 30 capsule 0  . traMADol (ULTRAM) 50 MG tablet Take 50 mg by mouth every 4 (four) hours as needed for moderate pain.    Marland Kitchen zolpidem (AMBIEN) 5 MG tablet Take 5 mg by mouth at bedtime.     No current facility-administered medications for this encounter.     ECOG PERFORMANCE STATUS:  0 -  Asymptomatic  REVIEW OF SYSTEMS:  Patient denies any weight loss, fatigue, weakness, fever, chills or night sweats. Patient denies any loss of vision, blurred vision. Patient denies any ringing  of the ears or hearing loss. No irregular heartbeat. Patient denies heart murmur or history of fainting. Patient denies any chest pain or pain radiating to her upper extremities. Patient denies any shortness of breath, difficulty breathing at night, cough or hemoptysis. Patient denies any swelling in the lower legs. Patient denies any nausea vomiting, vomiting of blood, or coffee ground material in the vomitus. Patient denies any stomach pain. Patient states has had normal bowel movements no significant constipation or diarrhea. Patient denies any dysuria, hematuria or significant nocturia. Patient denies any problems walking, swelling in the joints or loss of balance. Patient denies any skin changes, loss of hair or loss of weight. Patient denies any excessive worrying or anxiety or significant depression. Patient denies any problems with insomnia. Patient denies excessive thirst, polyuria, polydipsia. Patient denies any swollen glands, patient denies easy bruising or easy bleeding. Patient denies any recent infections, allergies or URI. Patient "s visual fields have not changed significantly in recent time.    PHYSICAL EXAM: BP (!) (P) 149/95 (BP Location: Right Arm, Patient Position:  Sitting)   Pulse (!) (P) 105   Temp (P) 98 F (36.7 C) (Tympanic)   Wt (P) 179 lb 7.3 oz (81.4 kg)   BMI (P) 29.86 kg/m  Left breast is wide local excision and sentinel node biopsy incisions which are both healing well. Still tender. No dominant mass or nodularity is noted in either breast in 2 positions examined. No axillary or supraclavicular adenopathy is appreciated.Well-developed well-nourished patient in NAD. HEENT reveals PERLA, EOMI, discs not visualized.  Oral cavity is clear. No oral mucosal lesions are identified.  Neck is clear without evidence of cervical or supraclavicular adenopathy. Lungs are clear to A&P. Cardiac examination is essentially unremarkable with regular rate and rhythm without murmur rub or thrill. Abdomen is benign with no organomegaly or masses noted. Motor sensory and DTR levels are equal and symmetric in the upper and lower extremities. Cranial nerves II through XII are grossly intact. Proprioception is intact. No peripheral adenopathy or edema is identified. No motor or sensory levels are noted. Crude visual fields are within normal range.  LABORATORY DATA: pathology reports reviewed    RADIOLOGY RESULTS:mammogram and ultrasound reviewed   IMPRESSION: stage I(T1 1 N0 M0) invasive mammary carcinoma the left breast as was wide local excision and sentinel node biopsy ER/PR positive HER-2/neu negative with Oncotype DX pending in 47 year old female  PLAN: at this time I have recommended whole breast radiation. Her breasts are rather large and pendulous making hypofractionated course of treatment difficult. I would plan on delivering 5040 cGy in 28 fractions boosting her scar another 1400 cGy using electron beam. Risks and benefits of treatment include a skin reaction fatigue alteration of blood counts possible inclusion of superficial lung all were discussed in detail with the patient. I will wait her Oncotype DX before proceeding with simulation and have moved her situation date out about 2 weeks to make sure we have information prior to simulation. Should she need systemic chemotherapy we'll sequence radiation after that. Patient and husband both seem to comprehend my treatment plan well. Patient is seeing Dr. Janese Banks today.  I would like to take this opportunity to thank you for allowing me to participate in the care of your patient.Noreene Filbert, MD

## 2018-06-29 NOTE — Progress Notes (Signed)
  Oncology Nurse Navigator Documentation  Navigator Location: [facility] (06/29/18 1200)   )Navigator Encounter Type: Clinic/[facility] (06/29/18 1200)                                                    Time Spent with Patient: 30 (06/29/18 1200)   Met with patient and her husband today.  She is status post lumpectomy and doing well.  Plan is for Oncotype DX testing.  She is going on a cruise on 07/19/18.  She is to follow up with Dr. Janese Banks prior to Thanksgiving.  She is to call with any questions or needs.

## 2018-07-01 ENCOUNTER — Other Ambulatory Visit: Payer: Self-pay | Admitting: Family Medicine

## 2018-07-01 DIAGNOSIS — M654 Radial styloid tenosynovitis [de Quervain]: Secondary | ICD-10-CM

## 2018-07-02 NOTE — Progress Notes (Signed)
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  She recently underwent a screening mammogram which showed abnormal calcifications in her left breast.  Diagnostic mammogram and ultrasound confirmed 9 x 5 x 5 mm calcifications.  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  There was a single focus worrisome but not diagnostic for microinvasion.  Patient has met with surgery and is tentatively scheduled to undergo lumpectomy and sentinel lymph node biopsy next week.  Patient states that she has used birth control for the last 25 years.  She used to have regular.  But about 5 months ago she did not have her menstrual cycles for about 5 months.  At that point her birth control was temporarily held and she did have her menstrual cycle after 5 months.  She did restart her birth control at that point.  She feels well today and denies any complaints.  Her appetite is good and she denies any unintentional weight loss. She is G1P1L1.  No other family history of breast cancer other than her mother had DCIS.  Patient had a biopsy on 06/22/2018.  Final pathology showed invasive mammary carcinoma 5 mm grade 2 ER greater than 90% positive PR 51 to 90% positive and HER-2/neu negative with negative margins.  Sentinel lymph node was negative for malignancy.  This was associated with high-grade DCIS.  Interval history-patient reports that her left breast is still sore at the  site of lumpectomy.  It is healing slowly and she denies any complaints today.  Patient is currently off birth control and she did get another period following that  ECOG PS- 0 Pain scale- 0 Opioid associated constipation- no  Review of systems- Review of Systems  Constitutional: Negative for chills, fever, malaise/fatigue and weight loss.  HENT: Negative for congestion, ear discharge and nosebleeds.   Eyes: Negative for blurred vision.  Respiratory: Negative for cough, hemoptysis, sputum production, shortness of breath and wheezing.   Cardiovascular: Negative for chest pain, palpitations, orthopnea and claudication.  Gastrointestinal: Negative for abdominal pain, blood in stool, constipation, diarrhea, heartburn, melena, nausea and vomiting.  Genitourinary: Negative for dysuria, flank pain, frequency, hematuria and urgency.  Musculoskeletal: Negative for back pain, joint pain and myalgias.  Skin: Negative for rash.  Neurological: Negative for dizziness, tingling, focal weakness, seizures, weakness and headaches.  Endo/Heme/Allergies: Does not bruise/bleed easily.  Psychiatric/Behavioral: Negative for depression and suicidal ideas. The patient does not have insomnia.       Allergies  Allergen Reactions  . Quinolones Hives  . Clindamycin Hives  . Codeine Other (See Comments) and Palpitations    tachycardia     Past Medical History:  Diagnosis Date  . Anxiety   . Cancer (HCC)    LEFT BREAST   . Complication of anesthesia   . GERD (gastroesophageal reflux disease)   . Headache    migraines/stress, optical migraines  . History of kidney stones   . Multilevel degenerative disc disease   . PONV (postoperative nausea and  vomiting)   . Vertigo    last episode 10/2016-NO PROBLEMS SINCE SINUS SURGERY IN 2018  . Wears contact lenses      Past Surgical History:  Procedure Laterality Date  . BREAST BIOPSY Right 06/08/2016   CYSTIC PAPILLARY APOCRINE METAPLASIA.   Marland Kitchen BREAST  BIOPSY Left 06/06/2018   left breast stereo X clip positive  . BREAST EXCISIONAL BIOPSY Left 06/22/2018   lumpectomy with sn and nl  . BREAST LUMPECTOMY Left 06/22/2018  . FRONTAL SINUS EXPLORATION Bilateral 12/22/2016   Procedure: FRONTAL SINUS EXPLORATION;  Surgeon: Margaretha Sheffield, MD;  Location: [place];  Service: ENT;  Laterality: Bilateral;  . IMAGE GUIDED SINUS SURGERY N/A 12/22/2016   Procedure: IMAGE GUIDED SINUS SURGERY;  Surgeon: Margaretha Sheffield, MD;  Location: [place];  Service: ENT;  Laterality: N/A;  . MAXILLARY ANTROSTOMY Bilateral 12/22/2016   Procedure: MAXILLARY ANTROSTOMY;  Surgeon: Margaretha Sheffield, MD;  Location: [place];  Service: ENT;  Laterality: Bilateral;  . PARTIAL MASTECTOMY WITH NEEDLE LOCALIZATION Left 06/22/2018   Procedure: PARTIAL MASTECTOMY WITH NEEDLE LOCALIZATION;  Surgeon: Herbert Pun, MD;  Location: [facility];  Service: General;  Laterality: Left;  . SENTINEL NODE BIOPSY Left 06/22/2018   Procedure: SENTINEL NODE BIOPSY;  Surgeon: Herbert Pun, MD;  Location: [facility];  Service: General;  Laterality: Left;  . SEPTOPLASTY N/A 12/22/2016   Procedure: SEPTOPLASTY;  Surgeon: Margaretha Sheffield, MD;  Location: [place];  Service: ENT;  Laterality: N/A;  GAVE DISK TO CECE 4-5 KP  . TURBINATE REDUCTION N/A 12/22/2016   Procedure: TURBINATE REDUCTION partial inferior;  Surgeon: Margaretha Sheffield, MD;  Location: [place];  Service: ENT;  Laterality: N/A;    Social History   Socioeconomic History  . Marital status: Married    Spouse name: Not on file  . Number of children: Not on file  . Years of education: Not on file  . Highest education level: Not on file  Occupational History  . Not on file  Social Needs  . Financial resource strain: Not on file  . Food insecurity:    Worry: Not on file    Inability: Not on file  . Transportation needs:    Medical: Not on file    Non-medical: Not on file  Tobacco  Use  . Smoking status: Former Smoker    Packs/day: 1.00    Years: 15.00    Pack years: 15.00    Types: Cigarettes    Last attempt to quit: 2013    Years since quitting: 6.8  . Smokeless tobacco: Never Used  Substance and Sexual Activity  . Alcohol use: Yes    Alcohol/week: 0.0 standard drinks    Comment: social (4-6 drinks/month)  . Drug use: No  . Sexual activity: Yes  Lifestyle  . Physical activity:    Days per week: Not on file    Minutes per session: Not on file  . Stress: Not on file  Relationships  . Social connections:    Talks on phone: Not on file    Gets together: Not on file    Attends religious service: Not on file    Active member of club or organization: Not on file    Attends meetings of clubs or organizations: Not on file    Relationship status: Not on file  . Intimate partner violence:    Fear of current or ex partner: Not on file    Emotionally abused: Not on file    Physically  abused: Not on file    Forced sexual activity: Not on file  Other Topics Concern  . Not on file  Social History Narrative  . Not on file    Family History  Problem Relation Age of Onset  . Breast cancer Mother        dx 3s; 2nd primary at 19  . Breast cancer Paternal Grandmother        dx 52s; deceased 8s  . Breast cancer Paternal Aunt        dx 55s; currently 63s  . Breast cancer Cousin        dx 98s; currently late 49s; daughter of a paternal uncle  . Lung cancer Paternal Uncle        smoker; deceased     Current Outpatient Medications:  .  gabapentin (NEURONTIN) 300 MG capsule, Take 1 capsule (300 mg total) by mouth 3 (three) times daily for 14 days., Disp: 42 capsule, Rfl: 0 .  hydrochlorothiazide (MICROZIDE) 12.5 MG capsule, Take 12.5 mg by mouth daily. , Disp: , Rfl:  .  levocetirizine (XYZAL) 5 MG tablet, Take 5 mg by mouth every morning. , Disp: , Rfl:  .  nabumetone (RELAFEN) 500 MG tablet, Take 1 tablet (500 mg total) by mouth 2 (two) times daily for 10  days., Disp: 20 tablet, Rfl: 0 .  NON FORMULARY, Place 3 drops under the tongue at bedtime. Allergy Oral Drops made by [facility] , Disp: , Rfl:  .  norethindrone-ethinyl estradiol 1/35 (ORTHO-NOVUM, NORTREL,CYCLAFEM) tablet, Take 1 tablet by mouth daily. , Disp: , Rfl:  .  omeprazole (PRILOSEC OTC) 20 MG tablet, Take 20 mg by mouth every morning. , Disp: , Rfl:  .  traMADol (ULTRAM) 50 MG tablet, Take 50 mg by mouth every 4 (four) hours as needed for moderate pain., Disp: , Rfl:  .  zolpidem (AMBIEN) 5 MG tablet, Take 5 mg by mouth at bedtime., Disp: , Rfl:  .  ibuprofen (ADVIL,MOTRIN) 200 MG tablet, Take 800 mg by mouth every 6 (six) hours as needed for headache or moderate pain., Disp: , Rfl:  .  meloxicam (MOBIC) 15 MG tablet, TAKE 1 TABLET BY MOUTH EVERY DAY, Disp: 30 tablet, Rfl: 0 .  naproxen sodium (ALEVE) 220 MG tablet, Take 880 mg by mouth daily as needed (for pain or headache)., Disp: , Rfl:  .  promethazine (PHENERGAN) 12.5 MG tablet, Take 1 tablet (12.5 mg total) by mouth every 6 (six) hours as needed for up to 5 days for nausea or vomiting., Disp: 10 tablet, Rfl: 0 .  tamsulosin (FLOMAX) 0.4 MG CAPS capsule, Take 1 capsule (0.4 mg total) by mouth daily. (Patient not taking: Reported on 06/13/2018), Disp: 30 capsule, Rfl: 0  Physical exam:  Vitals:   06/29/18 0948  BP: 115/78  Pulse: 91  Resp: 18  Temp: 97.6 F (36.4 C)  TempSrc: Tympanic  SpO2: 98%  Weight: 178 lb 6.4 oz (80.9 kg)  Height: _0  (1.651 m)   Physical Exam  Constitutional: She is oriented to person, place, and time. She appears well-developed and well-nourished.  HENT:  Head: Normocephalic and atraumatic.  Eyes: Pupils are equal, round, and reactive to light. EOM are normal.  Neck: Normal range of motion.  Cardiovascular: Normal rate, regular rhythm and normal heart sounds.  Pulmonary/Chest: Effort normal and breath sounds normal.  Abdominal: Soft. Bowel sounds are normal.  Neurological: She is alert  and oriented to person, place, and time.  Skin: Skin  is warm and dry.  Patient is status post left lumpectomy with a well-healed surgical scar.  There is no evidence of.  There is mild inflammation at the site of surgery  CMP Latest Ref Rng & Units 06/12/2018  Glucose 70 - 99 mg/dL 89  BUN 6 - 20 mg/dL 11  Creatinine 0.44 - 1.00 mg/dL 0.79  Sodium 135 - 145 mmol/L 142  Potassium 3.5 - 5.1 mmol/L 4.1  Chloride 98 - 111 mmol/L 104  CO2 22 - 32 mmol/L 28  Calcium 8.9 - 10.3 mg/dL 9.5  Total Protein 6.5 - 8.1 g/dL 7.7  Total Bilirubin 0.3 - 1.2 mg/dL 0.8  Alkaline Phos 38 - 126 U/L 66  AST 15 - 41 U/L 28  ALT 0 - 44 U/L 22   No flowsheet data found.  No images are attached to the encounter.  Nm Sentinel Node Injection  Result Date: 06/22/2018 CLINICAL DATA:  Left breast cancer. EXAM: NUCLEAR MEDICINE BREAST LYMPHOSCINTIGRAPHY LEFT BREAST TECHNIQUE: Intradermal injection of radiopharmaceutical was performed at the 12 o'clock, 3 o'clock, 6 o'clock, and 9 o'clock positions around the left nipple. The patient was then sent to the operating room where the sentinel node(s) were identified and removed by the surgeon. RADIOPHARMACEUTICALS:  Total of 1 mCi Millipore-filtered Technetium-78msulfur colloid, injected in four aliquots of 0.25 mCi each. IMPRESSION: Uncomplicated intradermal injection of a total of 1 mCi Technetium-961mulfur colloid for purposes of sentinel node identification. Electronically Signed   By: ThMarcello MooresRegister   On: 06/22/2018 10:25   Mm Breast Surgical Specimen  Result Date: 06/22/2018 CLINICAL DATA:  History of LEFT breast cancer status post lumpectomy today after earlier needle localization. EXAM: SPECIMEN RADIOGRAPH OF THE LEFT BREAST COMPARISON:  Previous exam(s). FINDINGS: Status post excision of the left breast. The wire tip and biopsy marker clip are present and are marked for pathology. IMPRESSION: Specimen radiograph of the left breast. Electronically Signed   By:  StFranki Cabot.D.   On: 06/22/2018 13:12   Mm Clip Placement Left  Result Date: 06/06/2018 CLINICAL DATA:  [age]ear old female with indeterminate left breast calcifications. EXAM: DIAGNOSTIC LEFT MAMMOGRAM POST STEREOTACTIC BIOPSY COMPARISON:  Previous exam(s). FINDINGS: Mammographic images were obtained following stereotactic guided biopsy of the left breast. An X shaped clip is identified in the upper outer quadrant at middle depth. IMPRESSION: Post biopsy clip in the expected location. Final Assessment: Post Procedure Mammograms for Marker Placement Electronically Signed   By: SeKristopher Oppenheim.D.   On: 06/06/2018 08:53   Mm Lt Breast Bx W Loc Dev 1st Lesion Image Bx Spec Stereo Guide  Addendum Date: 06/08/2018   ADDENDUM REPORT: 06/08/2018 09:25 ADDENDUM: PATHOLOGY ADDENDUM: Pathology: High-grade DCIS, comedo type, with a small focus of possible micro invasion. Pathology concordant with imaging findings: Yes Recommendation: Surgical consultation. Localization/excision considerations: Wire localization. These results were called by telephone at the time of interpretation on 06/08/2018 at 9:25 am to the patient, who verbally acknowledged these results. She reports doing well after the biopsy with minimal pain. The findings and recommendations were discussed with the patient. Electronically Signed   By: DoFidela Salisbury.D.   On: 06/08/2018 09:25   Result Date: 06/08/2018 CLINICAL DATA:  [age]ear old female with indeterminate left breast calcifications. EXAM: LEFT BREAST STEREOTACTIC CORE NEEDLE BIOPSY COMPARISON:  Previous exams. FINDINGS: The patient and I discussed the procedure of stereotactic-guided biopsy including benefits and alternatives. We discussed the high likelihood of a successful procedure. We discussed the risks of the  procedure including infection, bleeding, tissue injury, clip migration, and inadequate sampling. Informed written consent was given. The usual time out protocol was  performed immediately prior to the procedure. Using sterile technique and 1% Lidocaine as local anesthetic, under stereotactic guidance, a 9 gauge vacuum assisted device was used to perform core needle biopsy of calcifications in the upper-outer quadrant of the left breast using a superior approach. Specimen radiograph was performed showing calcifications in multiple specimens. Specimens with calcifications are identified for pathology. Lesion quadrant: Upper-outer quadrant. At the conclusion of the procedure, a X shaped tissue marker clip was deployed into the biopsy cavity. Follow-up 2-view mammogram was performed and dictated separately. IMPRESSION: Stereotactic-guided biopsy of left breast calcifications. No apparent complications. Electronically Signed: By: Kristopher Oppenheim M.D. On: 06/06/2018 08:53   Mm Lt Plc Breast Loc Dev   1st Lesion  Inc Mammo Guide  Result Date: 06/22/2018 CLINICAL DATA:  Recent diagnosis of LEFT breast cancer scheduled for breast conservation surgery today requiring preoperative needle localization. EXAM: NEEDLE LOCALIZATION OF THE LEFT BREAST WITH MAMMO GUIDANCE COMPARISON:  Previous exams. FINDINGS: Patient presents for needle localization prior to breast conservation surgery. I met with the patient and we discussed the procedure of needle localization including benefits and alternatives. We discussed the high likelihood of a successful procedure. We discussed the risks of the procedure, including infection, bleeding, tissue injury, and further surgery. Informed, written consent was given. The usual time-out protocol was performed immediately prior to the procedure. Using mammographic guidance, sterile technique, 1% lidocaine and a 7 cm modified Kopans needle, the X shaped clip within the upper-outer quadrant of the LEFT breast localized using lateral approach. The images were marked for Dr. Windell Moment. IMPRESSION: Needle localization LEFT breast. No apparent complications.  Electronically Signed   By: Franki Cabot M.D.   On: 06/22/2018 09:08     Assessment and plan- Patient is a 47 y.o. female who was found to have DCIS on biopsy with a focus of microinvasion.  Final pathology shows stage IA pT1a pN 0 cM0 invasive mammary carcinoma grade 2 5 mm ER PR positive and HER-2/neu negative status post lumpectomy  I discussed the results of final pathology with the patient in detail.  On core biopsy patient was a focus of microinvasion.  Final pathology however does reveal a 5 mm grade 2 invasive mammary carcinoma he is positive and negative.  She has completed her lumpectomy with negative margins.  However we would need to do Oncotype testing to determine if she would need adjuvant chemotherapy or not.  Discussed what Oncotype testing is and how the results are interpreted.  Given that patient is less than 85 years of age and is premenopausal-if her score is less than 11 she will not need adjuvant chemotherapy.  If her score is 26 or higher she would be in the high risk group and will need adjuvant chemotherapy.  A score of 11-25 falls in the intermediate risk group.  She would not benefit from chemotherapy if her score is between 11-16 as well.  There is a small 2 to 3% benefit of chemotherapy at a score of 16-20 and 6 to 8% benefit  For a score of 21-25.  Although it has not been compared head-to-head ovarian suppression plus AI also confers a similar benefit of about 6% and can be considered as a reasonable alternative in her case if her score is between 21-25.  Patient would like to avoid chemotherapy if possible and does not think that  she would offer chemotherapy for scores between 16-20.  She is leaning more towards ovarian suppression plus AI if her score is between 21-25.  I explained to her that once we start ovarian suppression plus AI and if she is unable to tolerate it we are likely going to lose the window of opportunity for chemotherapy as well.  Patient understands the  risks and benefits of both treatment options.  I will discuss this in further detail after her Oncotype testing is back and see her back in about 2 to 3 weeks time.  If patient does not require adjuvant chemotherapy she can proceed with radiation treatment at this time which we will know in 2 to 3 weeks time.  Her tumor is also strongly ER PR positive and there would be a role for adjuvant hormone therapy for 5 to 10 years.  Treatment will be given with curative intent  Her genetic testing did not reveal any significant genetic mutations.  She understands that she needs to stay off all forms of hormone therapy including oral contraceptives at this time  Cancer Staging Breast cancer [facility]) Staging form: Breast, AJCC 8th Edition - Pathologic stage from 06/29/2018: Stage IA (pT1a, pN0, cM0, G2, ER+, PR+, HER2-) - Signed by Sindy Guadeloupe, MD on 07/02/2018    Total face to face encounter time for this patient visit was 40 min. >50% of the time was  spent in counseling and coordination of care.     Visit Diagnosis 1. Malignant neoplasm of upper-outer quadrant of left breast in female, estrogen receptor positive ([place])   2. Goals of care, counseling/discussion      Dr. Randa Evens, MD, MPH [facility] [phone] 07/02/2018 8:50 AM

## 2018-07-06 ENCOUNTER — Encounter: Payer: Self-pay | Admitting: Oncology

## 2018-07-06 DIAGNOSIS — C50412 Malignant neoplasm of upper-outer quadrant of left female breast: Secondary | ICD-10-CM | POA: Insufficient documentation

## 2018-07-06 DIAGNOSIS — Z170 Estrogen receptor positive status [ER+]: Secondary | ICD-10-CM | POA: Insufficient documentation

## 2018-07-17 ENCOUNTER — Ambulatory Visit
Admission: RE | Admit: 2018-07-17 | Discharge: 2018-07-17 | Disposition: A | Discharge status: Home / Self Care | Payer: Managed Care, Other (non HMO) | Source: Ambulatory Visit | Attending: Radiation Oncology | Admitting: Radiation Oncology

## 2018-07-17 DIAGNOSIS — D0512 Intraductal carcinoma in situ of left breast: Secondary | ICD-10-CM | POA: Insufficient documentation

## 2018-07-18 DIAGNOSIS — D0512 Intraductal carcinoma in situ of left breast: Secondary | ICD-10-CM | POA: Diagnosis not present

## 2018-07-20 ENCOUNTER — Other Ambulatory Visit: Payer: Self-pay | Admitting: *Deleted

## 2018-07-20 DIAGNOSIS — D0512 Intraductal carcinoma in situ of left breast: Secondary | ICD-10-CM

## 2018-07-23 ENCOUNTER — Other Ambulatory Visit: Payer: Self-pay | Admitting: Oncology

## 2018-07-24 ENCOUNTER — Other Ambulatory Visit: Payer: Self-pay

## 2018-07-24 ENCOUNTER — Inpatient Hospital Stay (HOSPITAL_BASED_OUTPATIENT_CLINIC_OR_DEPARTMENT_OTHER): Payer: Managed Care, Other (non HMO) | Admitting: Oncology

## 2018-07-24 ENCOUNTER — Inpatient Hospital Stay: Payer: Managed Care, Other (non HMO)

## 2018-07-24 ENCOUNTER — Encounter: Payer: Self-pay | Admitting: Oncology

## 2018-07-24 ENCOUNTER — Ambulatory Visit
Admission: RE | Admit: 2018-07-24 | Discharge: 2018-07-24 | Disposition: A | Discharge status: Home / Self Care | Payer: Managed Care, Other (non HMO) | Source: Ambulatory Visit | Attending: Radiation Oncology | Admitting: Radiation Oncology

## 2018-07-24 VITALS — BP 115/76 | HR 83 | Temp 97.3°F | Resp 12 | Ht 65.0 in | Wt 181.4 lb

## 2018-07-24 DIAGNOSIS — D0512 Intraductal carcinoma in situ of left breast: Secondary | ICD-10-CM | POA: Diagnosis not present

## 2018-07-24 DIAGNOSIS — Z79899 Other long term (current) drug therapy: Secondary | ICD-10-CM

## 2018-07-24 DIAGNOSIS — Z7189 Other specified counseling: Secondary | ICD-10-CM

## 2018-07-24 DIAGNOSIS — Z79818 Long term (current) use of other agents affecting estrogen receptors and estrogen levels: Secondary | ICD-10-CM | POA: Diagnosis not present

## 2018-07-24 DIAGNOSIS — Z87891 Personal history of nicotine dependence: Secondary | ICD-10-CM | POA: Diagnosis not present

## 2018-07-24 DIAGNOSIS — C50412 Malignant neoplasm of upper-outer quadrant of left female breast: Secondary | ICD-10-CM

## 2018-07-24 DIAGNOSIS — Z170 Estrogen receptor positive status [ER+]: Secondary | ICD-10-CM | POA: Diagnosis not present

## 2018-07-24 MED ORDER — LEUPROLIDE ACETATE 3.75 MG IM KIT
3.7500 mg | PACK | Freq: Once | INTRAMUSCULAR | Status: AC
  Administered 2018-07-24: 3.75 mg via INTRAMUSCULAR
  Filled 2018-07-24: qty 3.75

## 2018-07-24 NOTE — Progress Notes (Signed)
Patient here for treatment check. No changes since last appointment.

## 2018-07-25 ENCOUNTER — Telehealth: Payer: Self-pay | Admitting: *Deleted

## 2018-07-25 ENCOUNTER — Encounter: Payer: Self-pay | Admitting: *Deleted

## 2018-07-25 MED ORDER — MECLIZINE HCL 25 MG PO TABS: 25.0000 mg | ORAL_TABLET | Freq: Three times a day (TID) | ORAL | 0 refills | Status: DC | PRN

## 2018-07-25 NOTE — Telephone Encounter (Signed)
I called patient and she states the HA did not start til 6-7 pm and she got dizzy also . She states she took advil 800 mg and went to bed. She also had worse dizziness this am especially when she gets up from sitting to standing. She went to work today and she is going home and laying down. I told her that it is a possible side effect in 4-6% and it lasts 2-4 days. She has some tramadol for pain already at home and wants to know if she can take it. I called Janese Banks and she said yes, and to call in 2 days worth of meclizine for dizziness and make an appt for Monday in [place] to see dr. Janese Banks. Patient agreeable to all this. Was given appt over the phone and sent to pt email also on my chart

## 2018-07-25 NOTE — Telephone Encounter (Signed)
-----   Message from Shawnee Knapp, RN sent at 07/25/2018  9:25 AM EST ----- Regarding: Headache pain Patient called triage today complaining of severe headache and dizziness.  She feels like it is vertigo.  She has taken 800 mg of Motrin for the HA with no relief.  Radiating Top of her head into her eyes. Please advise or contact patient.  She did not know if this is side effect of lupron injection she received yesterday.

## 2018-07-30 ENCOUNTER — Inpatient Hospital Stay (HOSPITAL_BASED_OUTPATIENT_CLINIC_OR_DEPARTMENT_OTHER): Payer: Managed Care, Other (non HMO) | Admitting: Oncology

## 2018-07-30 ENCOUNTER — Encounter: Payer: Self-pay | Admitting: Oncology

## 2018-07-30 ENCOUNTER — Ambulatory Visit: Payer: Managed Care, Other (non HMO)

## 2018-07-30 VITALS — BP 120/89 | HR 85 | Temp 98.5°F | Resp 18 | Ht 65.0 in | Wt 181.9 lb

## 2018-07-30 DIAGNOSIS — G4440 Drug-induced headache, not elsewhere classified, not intractable: Secondary | ICD-10-CM | POA: Diagnosis not present

## 2018-07-30 DIAGNOSIS — Z170 Estrogen receptor positive status [ER+]: Secondary | ICD-10-CM | POA: Diagnosis not present

## 2018-07-30 DIAGNOSIS — C50412 Malignant neoplasm of upper-outer quadrant of left female breast: Secondary | ICD-10-CM | POA: Diagnosis not present

## 2018-07-30 DIAGNOSIS — D0512 Intraductal carcinoma in situ of left breast: Secondary | ICD-10-CM | POA: Diagnosis not present

## 2018-07-30 DIAGNOSIS — R0789 Other chest pain: Secondary | ICD-10-CM

## 2018-07-30 DIAGNOSIS — Z87891 Personal history of nicotine dependence: Secondary | ICD-10-CM

## 2018-07-30 NOTE — Progress Notes (Signed)
Hematology/Oncology Consult note [facility]  Telephone:([phone] Fax:([phone]  Patient Care Team: Juline Patch, MD as PCP - General (Family Medicine)   Name of the patient: Tiffany Humphrey  [id number]  [birth year]   Date of visit: 07/30/18  Diagnosis- Pathological prognostic stage I A invasive mammary carcinoma T1a pN0 cM0 ER, PR positive her 2 negative   Chief complaint/ Reason for visit- discuss oncotype results and further management  Heme/Onc history: patient is a 47 year old female whose mother was recently diagnosed with DCIS. She recently underwent a screening mammogram which showed abnormal calcifications in her left breast. Diagnostic mammogram and ultrasound confirmed 9 x 5 x 5 mm calcifications. 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 There was a single focus worrisome but not diagnostic for microinvasion. Patient has met with surgery and is tentatively scheduled to undergo lumpectomy and sentinel lymph node biopsy next week. Patient states that she has used birth control for the last 25 years. She used to have regular. But about 5 months ago she did not have her menstrual cycles for about 5 months. At that point her birth control was temporarily heldand she did have her menstrual cycle after 5 months. She did restart her birth control at that point.She feels well today and denies any complaints. Her appetite is good and she denies any unintentional weight loss. She is G1P1L1.No other family history of breast cancer other than her mother had DCIS.  Patient had a biopsy on 06/22/2018.  Final pathology showed invasive mammary carcinoma 5 mm grade 2 ER greater than 90% positive PR 51 to 90% positive and HER-2/neu negative with negative margins.  Sentinel lymph node was negative for malignancy.  This was associated with high-grade DCIS.  oncotype score came back at 16. Plan is to proceed with OS plus AI  Interval  history- she is doing well and had a great cruise vacation. Denies any complaints today  ECOG PS- 0 Pain scale- 0   Review of systems- Review of Systems  Constitutional: Negative for chills, fever, malaise/fatigue and weight loss.  HENT: Negative for congestion, ear discharge and nosebleeds.   Eyes: Negative for blurred vision.  Respiratory: Negative for cough, hemoptysis, sputum production, shortness of breath and wheezing.   Cardiovascular: Negative for chest pain, palpitations, orthopnea and claudication.  Gastrointestinal: Negative for abdominal pain, blood in stool, constipation, diarrhea, heartburn, melena, nausea and vomiting.  Genitourinary: Negative for dysuria, flank pain, frequency, hematuria and urgency.  Musculoskeletal: Negative for back pain, joint pain and myalgias.  Skin: Negative for rash.  Neurological: Negative for dizziness, tingling, focal weakness, seizures, weakness and headaches.  Endo/Heme/Allergies: Does not bruise/bleed easily.  Psychiatric/Behavioral: Negative for depression and suicidal ideas. The patient does not have insomnia.       Allergies  Allergen Reactions  . Quinolones Hives  . Clindamycin Hives  . Codeine Other (See Comments) and Palpitations    tachycardia     Past Medical History:  Diagnosis Date  . Anxiety   . Cancer (HCC)    LEFT BREAST   . Complication of anesthesia   . GERD (gastroesophageal reflux disease)   . Headache    migraines/stress, optical migraines  . History of kidney stones   . Multilevel degenerative disc disease   . PONV (postoperative nausea and vomiting)   . Vertigo    last episode 10/2016-NO PROBLEMS SINCE SINUS SURGERY IN 2018  . Wears contact lenses      Past Surgical  History:  Procedure Laterality Date  . BREAST BIOPSY Right 06/08/2016   CYSTIC PAPILLARY APOCRINE METAPLASIA.   Marland Kitchen BREAST BIOPSY Left 06/06/2018   left breast stereo X clip positive  . BREAST EXCISIONAL BIOPSY Left 06/22/2018    lumpectomy with sn and nl  . BREAST LUMPECTOMY Left 06/22/2018  . FRONTAL SINUS EXPLORATION Bilateral 12/22/2016   Procedure: FRONTAL SINUS EXPLORATION;  Surgeon: Margaretha Sheffield, MD;  Location: [place];  Service: ENT;  Laterality: Bilateral;  . IMAGE GUIDED SINUS SURGERY N/A 12/22/2016   Procedure: IMAGE GUIDED SINUS SURGERY;  Surgeon: Margaretha Sheffield, MD;  Location: [place];  Service: ENT;  Laterality: N/A;  . MAXILLARY ANTROSTOMY Bilateral 12/22/2016   Procedure: MAXILLARY ANTROSTOMY;  Surgeon: Margaretha Sheffield, MD;  Location: [place];  Service: ENT;  Laterality: Bilateral;  . PARTIAL MASTECTOMY WITH NEEDLE LOCALIZATION Left 06/22/2018   Procedure: PARTIAL MASTECTOMY WITH NEEDLE LOCALIZATION;  Surgeon: Herbert Pun, MD;  Location: [facility];  Service: General;  Laterality: Left;  . SENTINEL NODE BIOPSY Left 06/22/2018   Procedure: SENTINEL NODE BIOPSY;  Surgeon: Herbert Pun, MD;  Location: [facility];  Service: General;  Laterality: Left;  . SEPTOPLASTY N/A 12/22/2016   Procedure: SEPTOPLASTY;  Surgeon: Margaretha Sheffield, MD;  Location: [place];  Service: ENT;  Laterality: N/A;  GAVE DISK TO CECE 4-5 KP  . TURBINATE REDUCTION N/A 12/22/2016   Procedure: TURBINATE REDUCTION partial inferior;  Surgeon: Margaretha Sheffield, MD;  Location: [place];  Service: ENT;  Laterality: N/A;    Social History   Socioeconomic History  . Marital status: Married    Spouse name: Not on file  . Number of children: Not on file  . Years of education: Not on file  . Highest education level: Not on file  Occupational History  . Not on file  Social Needs  . Financial resource strain: Not on file  . Food insecurity:    Worry: Not on file    Inability: Not on file  . Transportation needs:    Medical: Not on file    Non-medical: Not on file  Tobacco Use  . Smoking status: Former Smoker    Packs/day: 1.00    Years: 15.00    Pack years: 15.00    Types:  Cigarettes    Last attempt to quit: 2013    Years since quitting: 6.9  . Smokeless tobacco: Never Used  Substance and Sexual Activity  . Alcohol use: Yes    Alcohol/week: 0.0 standard drinks    Comment: social (4-6 drinks/month)  . Drug use: No  . Sexual activity: Yes  Lifestyle  . Physical activity:    Days per week: Not on file    Minutes per session: Not on file  . Stress: Not on file  Relationships  . Social connections:    Talks on phone: Not on file    Gets together: Not on file    Attends religious service: Not on file    Active member of club or organization: Not on file    Attends meetings of clubs or organizations: Not on file    Relationship status: Not on file  . Intimate partner violence:    Fear of current or ex partner: Not on file    Emotionally abused: Not on file    Physically abused: Not on file    Forced sexual activity: Not on file  Other Topics Concern  . Not on file  Social History Narrative  . Not  on file    Family History  Problem Relation Age of Onset  . Breast cancer Mother        dx 65s; 2nd primary at 89  . Breast cancer Paternal Grandmother        dx 61s; deceased 67s  . Breast cancer Paternal Aunt        dx 3s; currently 69s  . Breast cancer Cousin        dx 38s; currently late 52s; daughter of a paternal uncle  . Lung cancer Paternal Uncle        smoker; deceased     Current Outpatient Medications:  .  hydrochlorothiazide (MICROZIDE) 12.5 MG capsule, Take 12.5 mg by mouth daily. , Disp: , Rfl:  .  ibuprofen (ADVIL,MOTRIN) 200 MG tablet, Take 800 mg by mouth every 6 (six) hours as needed for headache or moderate pain., Disp: , Rfl:  .  levocetirizine (XYZAL) 5 MG tablet, Take 5 mg by mouth every morning. , Disp: , Rfl:  .  meloxicam (MOBIC) 15 MG tablet, TAKE 1 TABLET BY MOUTH EVERY DAY, Disp: 30 tablet, Rfl: 0 .  naproxen sodium (ALEVE) 220 MG tablet, Take 880 mg by mouth daily as needed (for pain or headache)., Disp: , Rfl:  .   NON FORMULARY, Place 3 drops under the tongue at bedtime. Allergy Oral Drops made by [facility] , Disp: , Rfl:  .  omeprazole (PRILOSEC OTC) 20 MG tablet, Take 20 mg by mouth every morning. , Disp: , Rfl:  .  tamsulosin (FLOMAX) 0.4 MG CAPS capsule, Take 1 capsule (0.4 mg total) by mouth daily., Disp: 30 capsule, Rfl: 0 .  traMADol (ULTRAM) 50 MG tablet, Take 50 mg by mouth every 4 (four) hours as needed for moderate pain., Disp: , Rfl:  .  zolpidem (AMBIEN) 5 MG tablet, Take 5 mg by mouth at bedtime., Disp: , Rfl:  .  meclizine (ANTIVERT) 25 MG tablet, Take 1 tablet (25 mg total) by mouth 3 (three) times daily as needed for dizziness., Disp: 6 tablet, Rfl: 0 .  promethazine (PHENERGAN) 12.5 MG tablet, Take 1 tablet (12.5 mg total) by mouth every 6 (six) hours as needed for up to 5 days for nausea or vomiting., Disp: 10 tablet, Rfl: 0  Physical exam:  Vitals:   07/24/18 0917 07/24/18 0926  BP:  115/76  Pulse:  83  Resp: 12   Temp:  (!) 97.3 F (36.3 C)  Weight: 181 lb 6.4 oz (82.3 kg)   Height:  '5\' 5"'$  (1.651 m)   Physical Exam  Constitutional: She is oriented to person, place, and time. She appears well-developed and well-nourished.  HENT:  Head: Normocephalic and atraumatic.  Eyes: Pupils are equal, round, and reactive to light. EOM are normal.  Neck: Normal range of motion.  Cardiovascular: Normal rate, regular rhythm and normal heart sounds.  Pulmonary/Chest: Effort normal and breath sounds normal.  Abdominal: Soft. Bowel sounds are normal.  Neurological: She is alert and oriented to person, place, and time.  Skin: Skin is warm and dry.     CMP Latest Ref Rng & Units 06/12/2018  Glucose 70 - 99 mg/dL 89  BUN 6 - 20 mg/dL 11  Creatinine 0.44 - 1.00 mg/dL 0.79  Sodium 135 - 145 mmol/L 142  Potassium 3.5 - 5.1 mmol/L 4.1  Chloride 98 - 111 mmol/L 104  CO2 22 - 32 mmol/L 28  Calcium 8.9 - 10.3 mg/dL 9.5  Total Protein 6.5 - 8.1 g/dL  7.7  Total Bilirubin 0.3 - 1.2 mg/dL  0.8  Alkaline Phos 38 - 126 U/L 66  AST 15 - 41 U/L 28  ALT 0 - 44 U/L 22     Assessment and plan- Patient is a 47 y.o. female who was found to have DCIS on biopsy with a focus of microinvasion.  Final pathology shows stage IA pT1a pN 0 cM0 invasive mammary carcinoma grade 2 5 mm ER PR positive and HER-2/neu negative status post lumpectomy. She is here to discuss oncotype results and further management  oncotype score came back at 16 translating to 2% chemo benefit of distant recurrence in 9 years. Discussed small and likely insignificant benefit of chemotherapy. Patient does nto wish to consider chemotherapy at this time. Discussed that studies have shown 6-8% benefit of risk reduction with OS plus AI and that remains an option for her as she is still pre menopausal. She is willing to consider that option. She will proceed with her 1st dose of lupron today. Discussed possible side fefcts of lupron including all but not limited to hot flashes, fatigue and mood swings. She will call us if she has any significant side effects after lupron. Plan is to give her 2-3 months of lupron and check her hormone levels. If estradiol is adequately supressed, I will start arimidex thereafter.  She will be starting adjuvant RT soon.  She will get her next Lupron shot in 1 month and I will see her back in 2 months for her third dose of Lupron   Visit Diagnosis 1. Goals of care, counseling/discussion   2. Malignant neoplasm of upper-outer quadrant of left breast in female, estrogen receptor positive ([place])   3. Use of leuprolide acetate (Lupron)      Dr. Randa Evens, MD, MPH [facility] at [facility] [phone] 07/30/2018 9:13 AM

## 2018-07-30 NOTE — Progress Notes (Signed)
Pt states she tried the tylenol and it did not help. The motrin helped to take edge off but not go away. She used the tramadol and it made her sleep so she did not take much of it. She tried the meclizine but it knocked her out so she only took it once. She is ok with dizziness when she sits but moving makes it worse. She has not worked but few hours on tues last week. She is also hurting on left side under her breast at rib area. It hurts to turn over on it. Sore to touch. She is tearful because she is not like the person who gets sick and this new dx and the side effects of drug has changed her to teary eyed at times and that is not her normal.

## 2018-07-30 NOTE — Progress Notes (Signed)
Hematology/Oncology Consult note [facility]  Telephone:([phone] Fax:([phone]  Patient Care Team: Juline Patch, MD as PCP - General (Family Medicine)   Name of the patient: Tiffany Humphrey  [id number]  October 25, 1970   Date of visit: 07/30/18  Diagnosis- Pathological prognostic stage I A invasive mammary carcinoma T1a pN0 cM0 ER, PR positive her 2 negative   Chief complaint/ Reason for visit- sick visit for ongoing headache post lupron  Heme/Onc history: patient is a 47 year old female whose mother was recently diagnosed with DCIS. She recently underwent a screening mammogram which showed abnormal calcifications in her left breast. Diagnostic mammogram and ultrasound confirmed 9 x 5 x 5 mm calcifications. 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 There was a single focus worrisome but not diagnostic for microinvasion. Patient has met with surgery and is tentatively scheduled to undergo lumpectomy and sentinel lymph node biopsy next week. Patient states that she has used birth control for the last 25 years. She used to have regular. But about 5 months ago she did not have her menstrual cycles for about 5 months. At that point her birth control was temporarily heldand she did have her menstrual cycle after 5 months. She did restart her birth control at that point.She feels well today and denies any complaints. Her appetite is good and she denies any unintentional weight loss. She is G1P1L1.No other family history of breast cancer other than her mother had DCIS.  Patient had a biopsy on 06/22/2018. Final pathology showed invasive mammary carcinoma 5 mm grade 2 ER greater than 90% positive PR 51 to 90% positive and HER-2/neu negative with negative margins. Sentinel lymph node was negative for malignancy. This was associated with high-grade DCIS.  oncotype score came back at 16. Plan is to proceed with OS plus AI   Interval  history-patient received her first dose of Lupron on 07/24/2018.  Since then patient has had an intense headache to the point that she was unable to work.  She has had migraine headaches before but feels like this headache was different.  It was also associated with some dizziness.  Symptoms are slowly improving but still persist.  She has been taking round-the-clock NSAIDs with minimal improvement.  She did not feel that the Tylenol worked.  We did prescribe her meclizine for dizziness which made her feel more drowsy and did not really improve her symptoms so she stopped taking it.  She also reports some pain and tenderness over her left rib cage  ECOG PS- 0 Pain scale- 5 Opioid associated constipation- no  Review of systems- Review of Systems  Constitutional: Negative for chills, fever, malaise/fatigue and weight loss.  HENT: Negative for congestion, ear discharge and nosebleeds.   Eyes: Negative for blurred vision.  Respiratory: Negative for cough, hemoptysis, sputum production, shortness of breath and wheezing.        Left chest wall pain  Cardiovascular: Negative for chest pain, palpitations, orthopnea and claudication.  Gastrointestinal: Negative for abdominal pain, blood in stool, constipation, diarrhea, heartburn, melena, nausea and vomiting.  Genitourinary: Negative for dysuria, flank pain, frequency, hematuria and urgency.  Musculoskeletal: Negative for back pain, joint pain and myalgias.  Skin: Negative for rash.  Neurological: Positive for headaches. Negative for dizziness, tingling, focal weakness, seizures and weakness.  Endo/Heme/Allergies: Does not bruise/bleed easily.  Psychiatric/Behavioral: Negative for depression and suicidal ideas. The patient does not have insomnia.        Allergies  Allergen Reactions  .  Quinolones Hives  . Clindamycin Hives  . Codeine Other (See Comments) and Palpitations    tachycardia     Past Medical History:  Diagnosis Date  . Anxiety     . Cancer (HCC)    LEFT BREAST   . Complication of anesthesia   . GERD (gastroesophageal reflux disease)   . Headache    migraines/stress, optical migraines  . History of kidney stones   . Multilevel degenerative disc disease   . PONV (postoperative nausea and vomiting)   . Vertigo    last episode 10/2016-NO PROBLEMS SINCE SINUS SURGERY IN 2018  . Wears contact lenses      Past Surgical History:  Procedure Laterality Date  . BREAST BIOPSY Right 06/08/2016   CYSTIC PAPILLARY APOCRINE METAPLASIA.   Marland Kitchen BREAST BIOPSY Left 06/06/2018   left breast stereo X clip positive  . BREAST EXCISIONAL BIOPSY Left 06/22/2018   lumpectomy with sn and nl  . BREAST LUMPECTOMY Left 06/22/2018  . FRONTAL SINUS EXPLORATION Bilateral 12/22/2016   Procedure: FRONTAL SINUS EXPLORATION;  Surgeon: Margaretha Sheffield, MD;  Location: [place];  Service: ENT;  Laterality: Bilateral;  . IMAGE GUIDED SINUS SURGERY N/A 12/22/2016   Procedure: IMAGE GUIDED SINUS SURGERY;  Surgeon: Margaretha Sheffield, MD;  Location: [place];  Service: ENT;  Laterality: N/A;  . MAXILLARY ANTROSTOMY Bilateral 12/22/2016   Procedure: MAXILLARY ANTROSTOMY;  Surgeon: Margaretha Sheffield, MD;  Location: [place];  Service: ENT;  Laterality: Bilateral;  . PARTIAL MASTECTOMY WITH NEEDLE LOCALIZATION Left 06/22/2018   Procedure: PARTIAL MASTECTOMY WITH NEEDLE LOCALIZATION;  Surgeon: Herbert Pun, MD;  Location: [facility];  Service: General;  Laterality: Left;  . SENTINEL NODE BIOPSY Left 06/22/2018   Procedure: SENTINEL NODE BIOPSY;  Surgeon: Herbert Pun, MD;  Location: [facility];  Service: General;  Laterality: Left;  . SEPTOPLASTY N/A 12/22/2016   Procedure: SEPTOPLASTY;  Surgeon: Margaretha Sheffield, MD;  Location: [place];  Service: ENT;  Laterality: N/A;  GAVE DISK TO CECE 4-5 KP  . TURBINATE REDUCTION N/A 12/22/2016   Procedure: TURBINATE REDUCTION partial inferior;  Surgeon: Margaretha Sheffield, MD;   Location: [place];  Service: ENT;  Laterality: N/A;    Social History   Socioeconomic History  . Marital status: Married    Spouse name: Not on file  . Number of children: Not on file  . Years of education: Not on file  . Highest education level: Not on file  Occupational History  . Not on file  Social Needs  . Financial resource strain: Not on file  . Food insecurity:    Worry: Not on file    Inability: Not on file  . Transportation needs:    Medical: Not on file    Non-medical: Not on file  Tobacco Use  . Smoking status: Former Smoker    Packs/day: 1.00    Years: 15.00    Pack years: 15.00    Types: Cigarettes    Last attempt to quit: 2013    Years since quitting: 6.9  . Smokeless tobacco: Never Used  Substance and Sexual Activity  . Alcohol use: Yes    Alcohol/week: 0.0 standard drinks    Comment: social (4-6 drinks/month)  . Drug use: No  . Sexual activity: Yes  Lifestyle  . Physical activity:    Days per week: Not on file    Minutes per session: Not on file  . Stress: Not on file  Relationships  . Social connections:  Talks on phone: Not on file    Gets together: Not on file    Attends religious service: Not on file    Active member of club or organization: Not on file    Attends meetings of clubs or organizations: Not on file    Relationship status: Not on file  . Intimate partner violence:    Fear of current or ex partner: Not on file    Emotionally abused: Not on file    Physically abused: Not on file    Forced sexual activity: Not on file  Other Topics Concern  . Not on file  Social History Narrative  . Not on file    Family History  Problem Relation Age of Onset  . Breast cancer Mother        dx 42s; 2nd primary at 56  . Breast cancer Paternal Grandmother        dx 50s; deceased 73s  . Breast cancer Paternal Aunt        dx 26s; currently 76s  . Breast cancer Cousin        dx 4s; currently late 75s; daughter of a paternal  uncle  . Lung cancer Paternal Uncle        smoker; deceased     Current Outpatient Medications:  .  hydrochlorothiazide (MICROZIDE) 12.5 MG capsule, Take 12.5 mg by mouth daily. , Disp: , Rfl:  .  ibuprofen (ADVIL,MOTRIN) 200 MG tablet, Take 800 mg by mouth every 6 (six) hours as needed for headache or moderate pain., Disp: , Rfl:  .  levocetirizine (XYZAL) 5 MG tablet, Take 5 mg by mouth every morning. , Disp: , Rfl:  .  meclizine (ANTIVERT) 25 MG tablet, Take 1 tablet (25 mg total) by mouth 3 (three) times daily as needed for dizziness., Disp: 6 tablet, Rfl: 0 .  naproxen sodium (ALEVE) 220 MG tablet, Take 880 mg by mouth daily as needed (for pain or headache)., Disp: , Rfl:  .  NON FORMULARY, Place 3 drops under the tongue at bedtime. Allergy Oral Drops made by [facility] , Disp: , Rfl:  .  omeprazole (PRILOSEC OTC) 20 MG tablet, Take 20 mg by mouth every morning. , Disp: , Rfl:  .  traMADol (ULTRAM) 50 MG tablet, Take 50 mg by mouth every 4 (four) hours as needed for moderate pain., Disp: , Rfl:  .  zolpidem (AMBIEN) 5 MG tablet, Take 5 mg by mouth at bedtime., Disp: , Rfl:   Physical exam:  Vitals:   07/30/18 1108  BP: 120/89  Pulse: 85  Resp: 18  Temp: 98.5 F (36.9 C)  TempSrc: Tympanic  Weight: 181 lb 14.1 oz (82.5 kg)  Height: _0  (1.651 m)   Physical Exam  Constitutional: She is oriented to person, place, and time. She appears well-developed and well-nourished.  She appears in mild distress because of her ongoing headaches  HENT:  Head: Normocephalic and atraumatic.  Eyes: Pupils are equal, round, and reactive to light. EOM are normal.  Neck: Normal range of motion.  Cardiovascular: Normal rate, regular rhythm and normal heart sounds.  Pulmonary/Chest: Effort normal and breath sounds normal. She exhibits tenderness (There is mild tenderness to palpation and soft tissue swelling noted over chest wall over the ninth rib).  Abdominal: Soft. Bowel sounds are normal.   Neurological: She is alert and oriented to person, place, and time.  Skin: Skin is warm and dry.     CMP Latest Ref Rng & Units  06/12/2018  Glucose 70 - 99 mg/dL 89  BUN 6 - 20 mg/dL 11  Creatinine 0.44 - 1.00 mg/dL 0.79  Sodium 135 - 145 mmol/L 142  Potassium 3.5 - 5.1 mmol/L 4.1  Chloride 98 - 111 mmol/L 104  CO2 22 - 32 mmol/L 28  Calcium 8.9 - 10.3 mg/dL 9.5  Total Protein 6.5 - 8.1 g/dL 7.7  Total Bilirubin 0.3 - 1.2 mg/dL 0.8  Alkaline Phos 38 - 126 U/L 66  AST 15 - 41 U/L 28  ALT 0 - 44 U/L 22     Assessment and plan- Patient is a 47 y.o. female who was found to have DCIS on biopsy with a focus of microinvasion. Final pathology shows stage IA pT1a pN 0cM0 invasive mammary carcinoma grade 2 5 mm ER PR positive and HER-2/neu negative status post lumpectomy.  Oncotype came back at 16 and plan was to proceed with OS plus AI and patient received her first dose of Lupron on 07/24/2018  Headache: Likely secondary to Lupron.  Lupron has been associated with headaches and less than 10% of the cases.  Patient has been taking around-the-clock Motrin and Advil with minimal improvement but her headache is gradually getting better on its own.  I have asked her to try taking 1 g of Tylenol alternating with NSAID to minimize the use of NSAID but at the same time not to exceed 3 g of Tylenol.  She also has PRN tramadol from before and if NSAID and Tylenol does not take the edge of her headache she will try PRN tramadol.  These headaches which started after Lupron have been debilitating for her over the last 1 week.  I am therefore hesitant to rechallenge her with Lupron each month.  I will cancel her next months Lupron and see her back in 2 months time.  She would have completed her radiation to her left breast by then and we will discuss starting tamoxifen at that time.  Patient verbalized understanding.  She will call us to let us know how she is doing with her headaches over the next 1 week    Visit Diagnosis 1. Drug-induced headache, not elsewhere classified, not intractable      Dr. Randa Evens, MD, MPH [facility] [phone] 07/30/2018 3:13 PM

## 2018-07-31 ENCOUNTER — Ambulatory Visit
Admission: RE | Admit: 2018-07-31 | Discharge: 2018-07-31 | Disposition: A | Discharge status: Home / Self Care | Payer: Managed Care, Other (non HMO) | Source: Ambulatory Visit | Attending: Radiation Oncology | Admitting: Radiation Oncology

## 2018-07-31 DIAGNOSIS — D0512 Intraductal carcinoma in situ of left breast: Secondary | ICD-10-CM | POA: Insufficient documentation

## 2018-08-01 ENCOUNTER — Ambulatory Visit
Admission: RE | Admit: 2018-08-01 | Discharge: 2018-08-01 | Disposition: A | Discharge status: Home / Self Care | Payer: Managed Care, Other (non HMO) | Source: Ambulatory Visit | Attending: Radiation Oncology | Admitting: Radiation Oncology

## 2018-08-01 DIAGNOSIS — D0512 Intraductal carcinoma in situ of left breast: Secondary | ICD-10-CM | POA: Diagnosis not present

## 2018-08-02 ENCOUNTER — Ambulatory Visit
Admission: RE | Admit: 2018-08-02 | Discharge: 2018-08-02 | Disposition: A | Discharge status: Home / Self Care | Payer: Managed Care, Other (non HMO) | Source: Ambulatory Visit | Attending: Radiation Oncology | Admitting: Radiation Oncology

## 2018-08-02 DIAGNOSIS — D0512 Intraductal carcinoma in situ of left breast: Secondary | ICD-10-CM | POA: Diagnosis not present

## 2018-08-03 ENCOUNTER — Ambulatory Visit
Admission: RE | Admit: 2018-08-03 | Discharge: 2018-08-03 | Disposition: A | Discharge status: Home / Self Care | Payer: Managed Care, Other (non HMO) | Source: Ambulatory Visit | Attending: Radiation Oncology | Admitting: Radiation Oncology

## 2018-08-03 DIAGNOSIS — D0512 Intraductal carcinoma in situ of left breast: Secondary | ICD-10-CM | POA: Diagnosis not present

## 2018-08-06 ENCOUNTER — Ambulatory Visit
Admission: RE | Admit: 2018-08-06 | Discharge: 2018-08-06 | Disposition: A | Discharge status: Home / Self Care | Payer: Managed Care, Other (non HMO) | Source: Ambulatory Visit | Attending: Radiation Oncology | Admitting: Radiation Oncology

## 2018-08-06 DIAGNOSIS — D0512 Intraductal carcinoma in situ of left breast: Secondary | ICD-10-CM | POA: Diagnosis not present

## 2018-08-07 ENCOUNTER — Ambulatory Visit
Admission: RE | Admit: 2018-08-07 | Discharge: 2018-08-07 | Disposition: A | Discharge status: Home / Self Care | Payer: Managed Care, Other (non HMO) | Source: Ambulatory Visit | Attending: Radiation Oncology | Admitting: Radiation Oncology

## 2018-08-07 ENCOUNTER — Other Ambulatory Visit: Payer: Managed Care, Other (non HMO)

## 2018-08-07 DIAGNOSIS — D0512 Intraductal carcinoma in situ of left breast: Secondary | ICD-10-CM | POA: Diagnosis not present

## 2018-08-08 ENCOUNTER — Ambulatory Visit
Admission: RE | Admit: 2018-08-08 | Discharge: 2018-08-08 | Disposition: A | Discharge status: Home / Self Care | Payer: Managed Care, Other (non HMO) | Source: Ambulatory Visit | Attending: Radiation Oncology | Admitting: Radiation Oncology

## 2018-08-08 ENCOUNTER — Other Ambulatory Visit: Payer: Managed Care, Other (non HMO)

## 2018-08-08 DIAGNOSIS — D0512 Intraductal carcinoma in situ of left breast: Secondary | ICD-10-CM | POA: Diagnosis not present

## 2018-08-09 ENCOUNTER — Ambulatory Visit
Admission: RE | Admit: 2018-08-09 | Discharge: 2018-08-09 | Disposition: A | Discharge status: Home / Self Care | Payer: Managed Care, Other (non HMO) | Source: Ambulatory Visit | Attending: Radiation Oncology | Admitting: Radiation Oncology

## 2018-08-09 DIAGNOSIS — D0512 Intraductal carcinoma in situ of left breast: Secondary | ICD-10-CM | POA: Diagnosis not present

## 2018-08-10 ENCOUNTER — Ambulatory Visit
Admission: RE | Admit: 2018-08-10 | Discharge: 2018-08-10 | Disposition: A | Discharge status: Home / Self Care | Payer: Managed Care, Other (non HMO) | Source: Ambulatory Visit | Attending: Radiation Oncology | Admitting: Radiation Oncology

## 2018-08-10 DIAGNOSIS — D0512 Intraductal carcinoma in situ of left breast: Secondary | ICD-10-CM | POA: Diagnosis not present

## 2018-08-13 ENCOUNTER — Ambulatory Visit
Admission: RE | Admit: 2018-08-13 | Discharge: 2018-08-13 | Disposition: A | Discharge status: Home / Self Care | Payer: Managed Care, Other (non HMO) | Source: Ambulatory Visit | Attending: Radiation Oncology | Admitting: Radiation Oncology

## 2018-08-13 ENCOUNTER — Inpatient Hospital Stay: Payer: Managed Care, Other (non HMO)

## 2018-08-13 DIAGNOSIS — D0512 Intraductal carcinoma in situ of left breast: Secondary | ICD-10-CM

## 2018-08-13 LAB — CBC
HEMATOCRIT: 42.5 % (ref 36.0–46.0)
Hemoglobin: 14.3 g/dL (ref 12.0–15.0)
MCH: 29.4 pg (ref 26.0–34.0)
MCHC: 33.6 g/dL (ref 30.0–36.0)
MCV: 87.3 fL (ref 80.0–100.0)
Platelets: 354 10*3/uL (ref 150–400)
RBC: 4.87 MIL/uL (ref 3.87–5.11)
RDW: 13.1 % (ref 11.5–15.5)
WBC: 9.6 10*3/uL (ref 4.0–10.5)
nRBC: 0 % (ref 0.0–0.2)

## 2018-08-14 ENCOUNTER — Ambulatory Visit
Admission: RE | Admit: 2018-08-14 | Discharge: 2018-08-14 | Disposition: A | Discharge status: Home / Self Care | Payer: Managed Care, Other (non HMO) | Source: Ambulatory Visit | Attending: Radiation Oncology | Admitting: Radiation Oncology

## 2018-08-14 DIAGNOSIS — D0512 Intraductal carcinoma in situ of left breast: Secondary | ICD-10-CM | POA: Diagnosis not present

## 2018-08-15 ENCOUNTER — Ambulatory Visit
Admission: RE | Admit: 2018-08-15 | Discharge: 2018-08-15 | Disposition: A | Discharge status: Home / Self Care | Payer: Managed Care, Other (non HMO) | Source: Ambulatory Visit | Attending: Radiation Oncology | Admitting: Radiation Oncology

## 2018-08-15 DIAGNOSIS — D0512 Intraductal carcinoma in situ of left breast: Secondary | ICD-10-CM | POA: Diagnosis not present

## 2018-08-16 ENCOUNTER — Ambulatory Visit
Admission: RE | Admit: 2018-08-16 | Discharge: 2018-08-16 | Disposition: A | Discharge status: Home / Self Care | Payer: Managed Care, Other (non HMO) | Source: Ambulatory Visit | Attending: Radiation Oncology | Admitting: Radiation Oncology

## 2018-08-16 DIAGNOSIS — D0512 Intraductal carcinoma in situ of left breast: Secondary | ICD-10-CM | POA: Diagnosis not present

## 2018-08-17 ENCOUNTER — Ambulatory Visit
Admission: RE | Admit: 2018-08-17 | Discharge: 2018-08-17 | Disposition: A | Discharge status: Home / Self Care | Payer: Managed Care, Other (non HMO) | Source: Ambulatory Visit | Attending: Radiation Oncology | Admitting: Radiation Oncology

## 2018-08-17 DIAGNOSIS — D0512 Intraductal carcinoma in situ of left breast: Secondary | ICD-10-CM | POA: Diagnosis not present

## 2018-08-20 ENCOUNTER — Ambulatory Visit
Admission: RE | Admit: 2018-08-20 | Discharge: 2018-08-20 | Disposition: A | Discharge status: Home / Self Care | Payer: Managed Care, Other (non HMO) | Source: Ambulatory Visit | Attending: Radiation Oncology | Admitting: Radiation Oncology

## 2018-08-20 DIAGNOSIS — D0512 Intraductal carcinoma in situ of left breast: Secondary | ICD-10-CM | POA: Diagnosis not present

## 2018-08-21 ENCOUNTER — Ambulatory Visit: Payer: Managed Care, Other (non HMO)

## 2018-08-21 ENCOUNTER — Ambulatory Visit
Admission: RE | Admit: 2018-08-21 | Discharge: 2018-08-21 | Disposition: A | Discharge status: Home / Self Care | Payer: Managed Care, Other (non HMO) | Source: Ambulatory Visit | Attending: Radiation Oncology | Admitting: Radiation Oncology

## 2018-08-21 DIAGNOSIS — D0512 Intraductal carcinoma in situ of left breast: Secondary | ICD-10-CM | POA: Diagnosis not present

## 2018-08-23 ENCOUNTER — Ambulatory Visit
Admission: RE | Admit: 2018-08-23 | Discharge: 2018-08-23 | Disposition: A | Discharge status: Home / Self Care | Payer: Managed Care, Other (non HMO) | Source: Ambulatory Visit | Attending: Radiation Oncology | Admitting: Radiation Oncology

## 2018-08-23 DIAGNOSIS — D0512 Intraductal carcinoma in situ of left breast: Secondary | ICD-10-CM | POA: Diagnosis not present

## 2018-08-24 ENCOUNTER — Ambulatory Visit: Payer: Managed Care, Other (non HMO)

## 2018-08-24 DIAGNOSIS — D0512 Intraductal carcinoma in situ of left breast: Secondary | ICD-10-CM | POA: Diagnosis not present

## 2018-08-25 ENCOUNTER — Ambulatory Visit: Payer: Managed Care, Other (non HMO)

## 2018-08-27 ENCOUNTER — Ambulatory Visit
Admission: RE | Admit: 2018-08-27 | Discharge: 2018-08-27 | Disposition: A | Discharge status: Home / Self Care | Payer: Managed Care, Other (non HMO) | Source: Ambulatory Visit | Attending: Radiation Oncology | Admitting: Radiation Oncology

## 2018-08-27 ENCOUNTER — Ambulatory Visit: Payer: Managed Care, Other (non HMO)

## 2018-08-27 ENCOUNTER — Inpatient Hospital Stay: Payer: Managed Care, Other (non HMO)

## 2018-08-27 DIAGNOSIS — D0512 Intraductal carcinoma in situ of left breast: Secondary | ICD-10-CM | POA: Diagnosis not present

## 2018-08-27 LAB — CBC
HEMATOCRIT: 42.7 % (ref 36.0–46.0)
Hemoglobin: 14.5 g/dL (ref 12.0–15.0)
MCH: 29.3 pg (ref 26.0–34.0)
MCHC: 34 g/dL (ref 30.0–36.0)
MCV: 86.3 fL (ref 80.0–100.0)
NRBC: 0 % (ref 0.0–0.2)
Platelets: 316 10*3/uL (ref 150–400)
RBC: 4.95 MIL/uL (ref 3.87–5.11)
RDW: 13.2 % (ref 11.5–15.5)
WBC: 8.5 10*3/uL (ref 4.0–10.5)

## 2018-08-28 ENCOUNTER — Ambulatory Visit
Admission: RE | Admit: 2018-08-28 | Discharge: 2018-08-28 | Disposition: A | Discharge status: Home / Self Care | Payer: Managed Care, Other (non HMO) | Source: Ambulatory Visit | Attending: Radiation Oncology | Admitting: Radiation Oncology

## 2018-08-28 DIAGNOSIS — D0512 Intraductal carcinoma in situ of left breast: Secondary | ICD-10-CM | POA: Diagnosis not present

## 2018-08-30 ENCOUNTER — Ambulatory Visit
Admission: RE | Admit: 2018-08-30 | Discharge: 2018-08-30 | Disposition: A | Discharge status: Home / Self Care | Payer: Managed Care, Other (non HMO) | Source: Ambulatory Visit | Attending: Radiation Oncology | Admitting: Radiation Oncology

## 2018-08-30 DIAGNOSIS — D0512 Intraductal carcinoma in situ of left breast: Secondary | ICD-10-CM | POA: Insufficient documentation

## 2018-08-30 DIAGNOSIS — Z170 Estrogen receptor positive status [ER+]: Secondary | ICD-10-CM | POA: Diagnosis not present

## 2018-08-30 DIAGNOSIS — C50412 Malignant neoplasm of upper-outer quadrant of left female breast: Secondary | ICD-10-CM | POA: Diagnosis not present

## 2018-08-30 DIAGNOSIS — N951 Menopausal and female climacteric states: Secondary | ICD-10-CM | POA: Diagnosis not present

## 2018-08-30 DIAGNOSIS — Z87891 Personal history of nicotine dependence: Secondary | ICD-10-CM | POA: Diagnosis not present

## 2018-08-31 ENCOUNTER — Ambulatory Visit
Admission: RE | Admit: 2018-08-31 | Discharge: 2018-08-31 | Disposition: A | Discharge status: Home / Self Care | Payer: Managed Care, Other (non HMO) | Source: Ambulatory Visit | Attending: Radiation Oncology | Admitting: Radiation Oncology

## 2018-08-31 DIAGNOSIS — C50412 Malignant neoplasm of upper-outer quadrant of left female breast: Secondary | ICD-10-CM | POA: Diagnosis not present

## 2018-09-03 ENCOUNTER — Ambulatory Visit
Admission: RE | Admit: 2018-09-03 | Discharge: 2018-09-03 | Disposition: A | Discharge status: Home / Self Care | Payer: Managed Care, Other (non HMO) | Source: Ambulatory Visit | Attending: Radiation Oncology | Admitting: Radiation Oncology

## 2018-09-03 DIAGNOSIS — C50412 Malignant neoplasm of upper-outer quadrant of left female breast: Secondary | ICD-10-CM | POA: Diagnosis not present

## 2018-09-04 ENCOUNTER — Ambulatory Visit (INDEPENDENT_AMBULATORY_CARE_PROVIDER_SITE_OTHER): Payer: Managed Care, Other (non HMO) | Admitting: Family Medicine

## 2018-09-04 ENCOUNTER — Ambulatory Visit
Admission: RE | Admit: 2018-09-04 | Discharge: 2018-09-04 | Disposition: A | Discharge status: Home / Self Care | Payer: Managed Care, Other (non HMO) | Source: Ambulatory Visit | Attending: Radiation Oncology | Admitting: Radiation Oncology

## 2018-09-04 ENCOUNTER — Encounter: Payer: Self-pay | Admitting: Family Medicine

## 2018-09-04 VITALS — BP 120/80 | HR 88 | Ht 65.0 in | Wt 178.0 lb

## 2018-09-04 DIAGNOSIS — N761 Subacute and chronic vaginitis: Secondary | ICD-10-CM | POA: Diagnosis not present

## 2018-09-04 DIAGNOSIS — Z87442 Personal history of urinary calculi: Secondary | ICD-10-CM | POA: Diagnosis not present

## 2018-09-04 DIAGNOSIS — C50412 Malignant neoplasm of upper-outer quadrant of left female breast: Secondary | ICD-10-CM | POA: Diagnosis not present

## 2018-09-04 DIAGNOSIS — R350 Frequency of micturition: Secondary | ICD-10-CM

## 2018-09-04 DIAGNOSIS — N342 Other urethritis: Secondary | ICD-10-CM

## 2018-09-04 LAB — POCT URINALYSIS DIPSTICK
Bilirubin, UA: NEGATIVE
Glucose, UA: NEGATIVE
Ketones, UA: NEGATIVE
LEUKOCYTES UA: NEGATIVE
NITRITE UA: NEGATIVE
PROTEIN UA: NEGATIVE
Spec Grav, UA: 1.02 (ref 1.010–1.025)
Urobilinogen, UA: 0.2 E.U./dL
pH, UA: 7.5 (ref 5.0–8.0)

## 2018-09-04 MED ORDER — TAMSULOSIN HCL 0.4 MG PO CAPS: 0.4000 mg | ORAL_CAPSULE | Freq: Every day | ORAL | 0 refills | Status: DC

## 2018-09-04 MED ORDER — FLUCONAZOLE 150 MG PO TABS: 150.0000 mg | ORAL_TABLET | Freq: Once | ORAL | 0 refills | Status: AC

## 2018-09-04 MED ORDER — CEPHALEXIN 500 MG PO CAPS: 500.0000 mg | ORAL_CAPSULE | Freq: Two times a day (BID) | ORAL | 0 refills | Status: DC

## 2018-09-04 NOTE — Progress Notes (Signed)
Date:  09/04/2018   Name:  Tiffany Humphrey   DOB:  11-Apr-1971   MRN:  [id number]   Chief Complaint: Urinary Tract Infection (frequent urination- doesn't go alot when has to go)  Urinary Tract Infection   This is a new problem. The current episode started in the past 7 days. The problem occurs intermittently. The problem has been waxing and waning. The quality of the pain is described as aching. The pain is at a severity of 3/10. The pain is moderate. There has been no fever. Associated symptoms include frequency, hematuria, nausea and urgency. Pertinent negatives include no chills, discharge, flank pain, hesitancy, possible pregnancy, sweats or vomiting. Associated symptoms comments: Suprapubic tenderness. The treatment provided mild relief. There is no history of recurrent UTIs.    Review of Systems  Constitutional: Negative.  Negative for chills, fatigue, fever and unexpected weight change.  HENT: Negative for congestion, ear discharge, ear pain, rhinorrhea, sinus pressure, sneezing and sore throat.   Eyes: Negative for photophobia, pain, discharge, redness and itching.  Respiratory: Negative for cough, shortness of breath, wheezing and stridor.   Gastrointestinal: Positive for nausea. Negative for abdominal pain, blood in stool, constipation, diarrhea and vomiting.  Endocrine: Negative for cold intolerance, heat intolerance, polydipsia, polyphagia and polyuria.  Genitourinary: Positive for frequency, hematuria and urgency. Negative for dysuria, flank pain, hesitancy, menstrual problem, pelvic pain, vaginal bleeding and vaginal discharge.  Musculoskeletal: Negative for arthralgias, back pain and myalgias.  Skin: Negative for rash.  Allergic/Immunologic: Negative for environmental allergies and food allergies.  Neurological: Negative for dizziness, weakness, light-headedness, numbness and headaches.  Hematological: Negative for adenopathy. Does not bruise/bleed easily.    Psychiatric/Behavioral: Negative for dysphoric mood. The patient is not nervous/anxious.     Patient Active Problem List   Diagnosis Date Noted  . Breast cancer ([place]) 06/18/2018  . Migraines 08/03/2015    Allergies  Allergen Reactions  . Quinolones Hives  . Clindamycin Hives  . Codeine Other (See Comments) and Palpitations    tachycardia    Past Surgical History:  Procedure Laterality Date  . BREAST BIOPSY Right 06/08/2016   CYSTIC PAPILLARY APOCRINE METAPLASIA.   Marland Kitchen BREAST BIOPSY Left 06/06/2018   left breast stereo X clip positive  . BREAST EXCISIONAL BIOPSY Left 06/22/2018   lumpectomy with sn and nl  . BREAST LUMPECTOMY Left 06/22/2018  . FRONTAL SINUS EXPLORATION Bilateral 12/22/2016   Procedure: FRONTAL SINUS EXPLORATION;  Surgeon: Margaretha Sheffield, MD;  Location: [place];  Service: ENT;  Laterality: Bilateral;  . IMAGE GUIDED SINUS SURGERY N/A 12/22/2016   Procedure: IMAGE GUIDED SINUS SURGERY;  Surgeon: Margaretha Sheffield, MD;  Location: [place];  Service: ENT;  Laterality: N/A;  . MAXILLARY ANTROSTOMY Bilateral 12/22/2016   Procedure: MAXILLARY ANTROSTOMY;  Surgeon: Margaretha Sheffield, MD;  Location: [place];  Service: ENT;  Laterality: Bilateral;  . PARTIAL MASTECTOMY WITH NEEDLE LOCALIZATION Left 06/22/2018   Procedure: PARTIAL MASTECTOMY WITH NEEDLE LOCALIZATION;  Surgeon: Herbert Pun, MD;  Location: [facility];  Service: General;  Laterality: Left;  . SENTINEL NODE BIOPSY Left 06/22/2018   Procedure: SENTINEL NODE BIOPSY;  Surgeon: Herbert Pun, MD;  Location: [facility];  Service: General;  Laterality: Left;  . SEPTOPLASTY N/A 12/22/2016   Procedure: SEPTOPLASTY;  Surgeon: Margaretha Sheffield, MD;  Location: [place];  Service: ENT;  Laterality: N/A;  GAVE DISK TO CE[facility] 4-5 KP  . TURBINATE REDUCTION N/A 12/22/2016   Procedure: TURBINATE REDUCTION partial inferior;  Surgeon: Eddie Dibbles  Kathyrn Sheriff, MD;  Location: [place];   Service: ENT;  Laterality: N/A;    Social History   Tobacco Use  . Smoking status: Former Smoker    Packs/day: 1.00    Years: 15.00    Pack years: 15.00    Types: Cigarettes    Last attempt to quit: 2013    Years since quitting: 7.0  . Smokeless tobacco: Never Used  Substance Use Topics  . Alcohol use: Yes    Alcohol/week: 0.0 standard drinks    Comment: social (4-6 drinks/month)  . Drug use: No     Medication list has been reviewed and updated.  Current Meds  Medication Sig  . hydrochlorothiazide (MICROZIDE) 12.5 MG capsule Take 12.5 mg by mouth daily.   Marland Kitchen ibuprofen (ADVIL,MOTRIN) 200 MG tablet Take 800 mg by mouth every 6 (six) hours as needed for headache or moderate pain.  Marland Kitchen levocetirizine (XYZAL) 5 MG tablet Take 5 mg by mouth every morning.   . meclizine (ANTIVERT) 25 MG tablet Take 1 tablet (25 mg total) by mouth 3 (three) times daily as needed for dizziness.  . naproxen sodium (ALEVE) 220 MG tablet Take 880 mg by mouth daily as needed (for pain or headache).  . NON FORMULARY Place 3 drops under the tongue at bedtime. Allergy Oral Drops made by [facility]   . omeprazole (PRILOSEC OTC) 20 MG tablet Take 20 mg by mouth every morning.   . traMADol (ULTRAM) 50 MG tablet Take 50 mg by mouth every 4 (four) hours as needed for moderate pain.  Marland Kitchen zolpidem (AMBIEN) 5 MG tablet Take 5 mg by mouth at bedtime.    PHQ 2/9 Scores 05/29/2017 09/11/2015  PHQ - 2 Score 0 0  PHQ- 9 Score 0 -    Physical Exam Vitals signs and nursing note reviewed.  Constitutional:      General: She is not in acute distress.    Appearance: She is not diaphoretic.  HENT:     Head: Normocephalic and atraumatic.     Right Ear: External ear normal.     Left Ear: External ear normal.     Nose: Nose normal.  Eyes:     General:        Right eye: No discharge.        Left eye: No discharge.     Conjunctiva/sclera: Conjunctivae normal.     Pupils: Pupils are equal, round, and reactive to light.    Neck:     Musculoskeletal: Normal range of motion and neck supple.     Thyroid: No thyromegaly.     Vascular: No JVD.  Cardiovascular:     Rate and Rhythm: Normal rate and regular rhythm.     Heart sounds: Normal heart sounds. No murmur. No friction rub. No gallop.   Pulmonary:     Effort: Pulmonary effort is normal.     Breath sounds: Normal breath sounds. No wheezing or rhonchi.  Abdominal:     General: Bowel sounds are normal.     Palpations: Abdomen is soft. There is no mass.     Tenderness: There is abdominal tenderness in the suprapubic area. There is no guarding.  Musculoskeletal: Normal range of motion.  Lymphadenopathy:     Cervical: No cervical adenopathy.  Skin:    General: Skin is warm and dry.  Neurological:     Mental Status: She is alert.     Deep Tendon Reflexes: Reflexes are normal and symmetric.     BP  120/80   Pulse 88   Ht 5\' 5"  (1.651 m)   Wt 178 lb (80.7 kg)   BMI 29.62 kg/m   Assessment and Plan:  1. Urethritis Dysuria with frequency and urgency and exam consistent with UTI less likely urethritis.  Will treat with cephalexin 500 mg twice a day for 5 days. - cephALEXin (KEFLEX) 500 MG capsule; Take 1 capsule (500 mg total) by mouth 2 (two) times daily.  Dispense: 10 capsule; Refill: 0  2. Frequent urination Pain dipstick urinalysis to evaluate for UTI. - POCT urinalysis dipstick  3. History of kidney stones With history of kidney stones recently evaluated this past year in [facility].  Will give tamsulosin 0.4 mg daily for 10 days on the possibility of having a stone in the distal ureter. - tamsulosin (FLOMAX) 0.4 MG CAPS capsule; Take 1 capsule (0.4 mg total) by mouth daily.  Dispense: 10 capsule; Refill: 0  4. Chronic vaginitis Patient has vaginitis after antibiotic use scribe Diflucan 150 mg 1 a day. - fluconazole (DIFLUCAN) 150 MG tablet; Take 1 tablet (150 mg total) by mouth once for 1 dose.  Dispense: 2 tablet; Refill: 0

## 2018-09-05 ENCOUNTER — Ambulatory Visit
Admission: RE | Admit: 2018-09-05 | Discharge: 2018-09-05 | Disposition: A | Discharge status: Home / Self Care | Payer: Managed Care, Other (non HMO) | Source: Ambulatory Visit | Attending: Radiation Oncology | Admitting: Radiation Oncology

## 2018-09-05 DIAGNOSIS — C50412 Malignant neoplasm of upper-outer quadrant of left female breast: Secondary | ICD-10-CM | POA: Diagnosis not present

## 2018-09-06 ENCOUNTER — Ambulatory Visit
Admission: RE | Admit: 2018-09-06 | Discharge: 2018-09-06 | Disposition: A | Discharge status: Home / Self Care | Payer: Managed Care, Other (non HMO) | Source: Ambulatory Visit | Attending: Radiation Oncology | Admitting: Radiation Oncology

## 2018-09-06 DIAGNOSIS — C50412 Malignant neoplasm of upper-outer quadrant of left female breast: Secondary | ICD-10-CM | POA: Diagnosis not present

## 2018-09-07 ENCOUNTER — Ambulatory Visit
Admission: RE | Admit: 2018-09-07 | Discharge: 2018-09-07 | Disposition: A | Discharge status: Home / Self Care | Payer: Managed Care, Other (non HMO) | Source: Ambulatory Visit | Attending: Radiation Oncology | Admitting: Radiation Oncology

## 2018-09-07 DIAGNOSIS — C50412 Malignant neoplasm of upper-outer quadrant of left female breast: Secondary | ICD-10-CM | POA: Diagnosis not present

## 2018-09-10 ENCOUNTER — Ambulatory Visit
Admission: RE | Admit: 2018-09-10 | Discharge: 2018-09-10 | Disposition: A | Discharge status: Home / Self Care | Payer: Managed Care, Other (non HMO) | Source: Ambulatory Visit | Attending: Radiation Oncology | Admitting: Radiation Oncology

## 2018-09-10 ENCOUNTER — Inpatient Hospital Stay: Payer: Managed Care, Other (non HMO) | Attending: Oncology

## 2018-09-10 ENCOUNTER — Ambulatory Visit: Payer: Managed Care, Other (non HMO)

## 2018-09-10 DIAGNOSIS — N951 Menopausal and female climacteric states: Secondary | ICD-10-CM | POA: Insufficient documentation

## 2018-09-10 DIAGNOSIS — C50412 Malignant neoplasm of upper-outer quadrant of left female breast: Secondary | ICD-10-CM | POA: Insufficient documentation

## 2018-09-10 DIAGNOSIS — D0512 Intraductal carcinoma in situ of left breast: Secondary | ICD-10-CM

## 2018-09-10 DIAGNOSIS — Z170 Estrogen receptor positive status [ER+]: Secondary | ICD-10-CM | POA: Insufficient documentation

## 2018-09-10 DIAGNOSIS — Z87891 Personal history of nicotine dependence: Secondary | ICD-10-CM | POA: Insufficient documentation

## 2018-09-10 LAB — CBC
HCT: 43.8 % (ref 36.0–46.0)
Hemoglobin: 14.6 g/dL (ref 12.0–15.0)
MCH: 29.1 pg (ref 26.0–34.0)
MCHC: 33.3 g/dL (ref 30.0–36.0)
MCV: 87.4 fL (ref 80.0–100.0)
NRBC: 0 % (ref 0.0–0.2)
PLATELETS: 363 10*3/uL (ref 150–400)
RBC: 5.01 MIL/uL (ref 3.87–5.11)
RDW: 13.3 % (ref 11.5–15.5)
WBC: 9.3 10*3/uL (ref 4.0–10.5)

## 2018-09-11 ENCOUNTER — Ambulatory Visit: Payer: Managed Care, Other (non HMO)

## 2018-09-11 ENCOUNTER — Ambulatory Visit
Admission: RE | Admit: 2018-09-11 | Discharge: 2018-09-11 | Disposition: A | Discharge status: Home / Self Care | Payer: Managed Care, Other (non HMO) | Source: Ambulatory Visit | Attending: Radiation Oncology | Admitting: Radiation Oncology

## 2018-09-11 DIAGNOSIS — C50412 Malignant neoplasm of upper-outer quadrant of left female breast: Secondary | ICD-10-CM | POA: Diagnosis not present

## 2018-09-12 ENCOUNTER — Ambulatory Visit
Admission: RE | Admit: 2018-09-12 | Discharge: 2018-09-12 | Disposition: A | Discharge status: Home / Self Care | Payer: Managed Care, Other (non HMO) | Source: Ambulatory Visit | Attending: Radiation Oncology | Admitting: Radiation Oncology

## 2018-09-12 DIAGNOSIS — C50412 Malignant neoplasm of upper-outer quadrant of left female breast: Secondary | ICD-10-CM | POA: Diagnosis not present

## 2018-09-13 ENCOUNTER — Ambulatory Visit
Admission: RE | Admit: 2018-09-13 | Discharge: 2018-09-13 | Disposition: A | Discharge status: Home / Self Care | Payer: Managed Care, Other (non HMO) | Source: Ambulatory Visit | Attending: Radiation Oncology | Admitting: Radiation Oncology

## 2018-09-13 DIAGNOSIS — C50412 Malignant neoplasm of upper-outer quadrant of left female breast: Secondary | ICD-10-CM | POA: Diagnosis not present

## 2018-09-14 ENCOUNTER — Ambulatory Visit
Admission: RE | Admit: 2018-09-14 | Discharge: 2018-09-14 | Disposition: A | Discharge status: Home / Self Care | Payer: Managed Care, Other (non HMO) | Source: Ambulatory Visit | Attending: Radiation Oncology | Admitting: Radiation Oncology

## 2018-09-14 ENCOUNTER — Other Ambulatory Visit: Payer: Self-pay | Admitting: *Deleted

## 2018-09-14 DIAGNOSIS — C50412 Malignant neoplasm of upper-outer quadrant of left female breast: Secondary | ICD-10-CM | POA: Diagnosis not present

## 2018-09-14 MED ORDER — SILVER SULFADIAZINE 1 % EX CREA: 1.0000 "application " | TOPICAL_CREAM | Freq: Two times a day (BID) | CUTANEOUS | 2 refills | Status: DC

## 2018-09-17 ENCOUNTER — Ambulatory Visit
Admission: RE | Admit: 2018-09-17 | Discharge: 2018-09-17 | Disposition: A | Discharge status: Home / Self Care | Payer: Managed Care, Other (non HMO) | Source: Ambulatory Visit | Attending: Radiation Oncology | Admitting: Radiation Oncology

## 2018-09-17 DIAGNOSIS — C50412 Malignant neoplasm of upper-outer quadrant of left female breast: Secondary | ICD-10-CM | POA: Diagnosis not present

## 2018-09-18 ENCOUNTER — Other Ambulatory Visit: Payer: Self-pay

## 2018-09-18 ENCOUNTER — Encounter: Payer: Self-pay | Admitting: Oncology

## 2018-09-18 ENCOUNTER — Ambulatory Visit: Payer: Managed Care, Other (non HMO)

## 2018-09-18 ENCOUNTER — Ambulatory Visit
Admission: RE | Admit: 2018-09-18 | Discharge: 2018-09-18 | Disposition: A | Discharge status: Home / Self Care | Payer: Managed Care, Other (non HMO) | Source: Ambulatory Visit | Attending: Radiation Oncology | Admitting: Radiation Oncology

## 2018-09-18 ENCOUNTER — Inpatient Hospital Stay (HOSPITAL_BASED_OUTPATIENT_CLINIC_OR_DEPARTMENT_OTHER): Payer: Managed Care, Other (non HMO) | Admitting: Oncology

## 2018-09-18 VITALS — BP 109/76 | HR 87 | Temp 99.1°F | Resp 12 | Ht 65.0 in | Wt 180.6 lb

## 2018-09-18 DIAGNOSIS — C50412 Malignant neoplasm of upper-outer quadrant of left female breast: Secondary | ICD-10-CM

## 2018-09-18 DIAGNOSIS — Z170 Estrogen receptor positive status [ER+]: Secondary | ICD-10-CM | POA: Diagnosis not present

## 2018-09-18 DIAGNOSIS — N951 Menopausal and female climacteric states: Secondary | ICD-10-CM

## 2018-09-18 DIAGNOSIS — Z87891 Personal history of nicotine dependence: Secondary | ICD-10-CM

## 2018-09-18 MED ORDER — TAMOXIFEN CITRATE 20 MG PO TABS: 20.0000 mg | ORAL_TABLET | Freq: Every day | ORAL | 3 refills | Status: DC

## 2018-09-18 NOTE — Progress Notes (Signed)
Patient here for follow up. Patient reports radiation rash under left breast. Dr. Baruch Gouty  Is treating with silvadene cream.

## 2018-09-19 ENCOUNTER — Ambulatory Visit
Admission: RE | Admit: 2018-09-19 | Discharge: 2018-09-19 | Disposition: A | Discharge status: Home / Self Care | Payer: Managed Care, Other (non HMO) | Source: Ambulatory Visit | Attending: Radiation Oncology | Admitting: Radiation Oncology

## 2018-09-19 ENCOUNTER — Ambulatory Visit: Payer: Managed Care, Other (non HMO)

## 2018-09-19 DIAGNOSIS — C50412 Malignant neoplasm of upper-outer quadrant of left female breast: Secondary | ICD-10-CM | POA: Diagnosis not present

## 2018-09-20 ENCOUNTER — Ambulatory Visit: Payer: Managed Care, Other (non HMO)

## 2018-09-20 ENCOUNTER — Ambulatory Visit
Admission: RE | Admit: 2018-09-20 | Discharge: 2018-09-20 | Disposition: A | Discharge status: Home / Self Care | Payer: Managed Care, Other (non HMO) | Source: Ambulatory Visit | Attending: Radiation Oncology | Admitting: Radiation Oncology

## 2018-09-20 DIAGNOSIS — C50412 Malignant neoplasm of upper-outer quadrant of left female breast: Secondary | ICD-10-CM | POA: Diagnosis not present

## 2018-09-20 NOTE — Progress Notes (Signed)
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 She recently underwent a screening mammogram which showed abnormal calcifications in her left breast. Diagnostic mammogram and ultrasound confirmed 9 x 5 x 5 mm calcifications. 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 There was a single focus worrisome but not diagnostic for microinvasion. Patient has met with surgery and is tentatively scheduled to undergo lumpectomy and sentinel lymph node biopsy next week. Patient states that she has used birth control for the last 25 years. She used to have regular. But about 5 months ago she did not have her menstrual cycles for about 5 months. At that point her birth control was temporarily heldand she did have her menstrual cycle after 5 months. She did restart her birth control at that point.She feels well today and denies any complaints. Her appetite is good and she denies any unintentional weight loss. She is G1P1L1.No other family history of breast cancer other than her mother had DCIS.  Patient had a biopsy on 06/22/2018. Final pathology showed invasive mammary carcinoma 5 mm grade 2 ER greater than 90% positive PR 51 to 90% positive and HER-2/neu negative with negative margins. Sentinel lymph node was negative for malignancy. This was associated with high-grade DCIS.  oncotype score came back at 16. Plan is to proceed with OS plus AI.  However patient had significant  headache with the first dose of Lupron to the point that she could not function and headache persisted for almost a month.  Plan is to discontinue Lupron after 1 dose and switch her to tamoxifen instead  Patient will be completing adjuvant radiation treatment end of January 2020  Interval history-she has 3 more days of radiation left and still reports that her left breast is sore and heavy at times there are times when she gets episodes of sharp shooting pain in her left breast which lasts for a few minutes before it subsides.  Her headaches have completely resolved at this time  ECOG PS- 0 Pain scale- 0  Review of systems- Review of Systems  Constitutional: Negative for chills, fever, malaise/fatigue and weight loss.  HENT: Negative for congestion, ear discharge and nosebleeds.   Eyes: Negative for blurred vision.  Respiratory: Negative for cough, hemoptysis, sputum production, shortness of breath and wheezing.   Cardiovascular: Negative for chest pain, palpitations, orthopnea and claudication.  Gastrointestinal: Negative for abdominal pain, blood in stool, constipation, diarrhea, heartburn, melena, nausea and vomiting.  Genitourinary: Negative for dysuria, flank pain, frequency, hematuria and urgency.  Musculoskeletal: Negative for back pain, joint pain and myalgias.  Skin: Negative for rash.  Neurological: Negative for dizziness, tingling, focal weakness, seizures, weakness and headaches.  Endo/Heme/Allergies: Does not bruise/bleed easily.  Psychiatric/Behavioral: Negative for depression and suicidal ideas. The patient does not have insomnia.       Allergies  Allergen Reactions  . Quinolones Hives  . Clindamycin Hives  . Codeine Other (See Comments) and Palpitations    tachycardia     Past Medical History:  Diagnosis  Date  . Anxiety   . Cancer (HCC)    LEFT BREAST   . Complication of anesthesia   . GERD (gastroesophageal reflux disease)   . Headache    migraines/stress,  optical migraines  . History of kidney stones   . Multilevel degenerative disc disease   . PONV (postoperative nausea and vomiting)   . Vertigo    last episode 10/2016-NO PROBLEMS SINCE SINUS SURGERY IN 2018  . Wears contact lenses      Past Surgical History:  Procedure Laterality Date  . BREAST BIOPSY Right 06/08/2016   CYSTIC PAPILLARY APOCRINE METAPLASIA.   Marland Kitchen BREAST BIOPSY Left 06/06/2018   left breast stereo X clip positive  . BREAST EXCISIONAL BIOPSY Left 06/22/2018   lumpectomy with sn and nl  . BREAST LUMPECTOMY Left 06/22/2018  . FRONTAL SINUS EXPLORATION Bilateral 12/22/2016   Procedure: FRONTAL SINUS EXPLORATION;  Surgeon: Margaretha Sheffield, MD;  Location: [place];  Service: ENT;  Laterality: Bilateral;  . IMAGE GUIDED SINUS SURGERY N/A 12/22/2016   Procedure: IMAGE GUIDED SINUS SURGERY;  Surgeon: Margaretha Sheffield, MD;  Location: [place];  Service: ENT;  Laterality: N/A;  . MAXILLARY ANTROSTOMY Bilateral 12/22/2016   Procedure: MAXILLARY ANTROSTOMY;  Surgeon: Margaretha Sheffield, MD;  Location: [place];  Service: ENT;  Laterality: Bilateral;  . PARTIAL MASTECTOMY WITH NEEDLE LOCALIZATION Left 06/22/2018   Procedure: PARTIAL MASTECTOMY WITH NEEDLE LOCALIZATION;  Surgeon: Herbert Pun, MD;  Location: [facility];  Service: General;  Laterality: Left;  . SENTINEL NODE BIOPSY Left 06/22/2018   Procedure: SENTINEL NODE BIOPSY;  Surgeon: Herbert Pun, MD;  Location: [facility];  Service: General;  Laterality: Left;  . SEPTOPLASTY N/A 12/22/2016   Procedure: SEPTOPLASTY;  Surgeon: Margaretha Sheffield, MD;  Location: [place];  Service: ENT;  Laterality: N/A;  GAVE DISK TO [facility] 4-5 KP  . TURBINATE REDUCTION N/A 12/22/2016   Procedure: TURBINATE REDUCTION partial inferior;  Surgeon: Margaretha Sheffield, MD;  Location: [place];  Service: ENT;  Laterality: N/A;    Social History   Socioeconomic History  . Marital status: Married    Spouse  name: Not on file  . Number of children: Not on file  . Years of education: Not on file  . Highest education level: Not on file  Occupational History  . Not on file  Social Needs  . Financial resource strain: Not on file  . Food insecurity:    Worry: Not on file    Inability: Not on file  . Transportation needs:    Medical: Not on file    Non-medical: Not on file  Tobacco Use  . Smoking status: Former Smoker    Packs/day: 1.00    Years: 15.00    Pack years: 15.00    Types: Cigarettes    Last attempt to quit: 2013    Years since quitting: 7.0  . Smokeless tobacco: Never Used  Substance and Sexual Activity  . Alcohol use: Yes    Alcohol/week: 0.0 standard drinks    Comment: social (4-6 drinks/month)  . Drug use: No  . Sexual activity: Yes  Lifestyle  . Physical activity:    Days per week: Not on file    Minutes per session: Not on file  . Stress: Not on file  Relationships  . Social connections:    Talks on phone: Not on file    Gets together: Not on file    Attends religious service: Not on file    Active  member of club or organization: Not on file    Attends meetings of clubs or organizations: Not on file    Relationship status: Not on file  . Intimate partner violence:    Fear of current or ex partner: Not on file    Emotionally abused: Not on file    Physically abused: Not on file    Forced sexual activity: Not on file  Other Topics Concern  . Not on file  Social History Narrative  . Not on file    Family History  Problem Relation Age of Onset  . Breast cancer Mother        dx 29s; 2nd primary at 82  . Breast cancer Paternal Grandmother        dx 24s; deceased 12s  . Breast cancer Paternal Aunt        dx 77s; currently 31s  . Breast cancer Cousin        dx 51s; currently late 98s; daughter of a paternal uncle  . Lung cancer Paternal Uncle        smoker; deceased     Current Outpatient Medications:  .  hydrochlorothiazide (MICROZIDE) 12.5 MG  capsule, Take 12.5 mg by mouth daily. , Disp: , Rfl:  .  ibuprofen (ADVIL,MOTRIN) 200 MG tablet, Take 800 mg by mouth every 6 (six) hours as needed for headache or moderate pain., Disp: , Rfl:  .  levocetirizine (XYZAL) 5 MG tablet, Take 5 mg by mouth every morning. , Disp: , Rfl:  .  meclizine (ANTIVERT) 25 MG tablet, Take 1 tablet (25 mg total) by mouth 3 (three) times daily as needed for dizziness., Disp: 6 tablet, Rfl: 0 .  naproxen sodium (ALEVE) 220 MG tablet, Take 880 mg by mouth daily as needed (for pain or headache)., Disp: , Rfl:  .  NON FORMULARY, Place 3 drops under the tongue at bedtime. Allergy Oral Drops made by [facility] , Disp: , Rfl:  .  omeprazole (PRILOSEC OTC) 20 MG tablet, Take 20 mg by mouth every morning. , Disp: , Rfl:  .  silver sulfADIAZINE (SILVADENE) 1 % cream, Apply 1 application topically 2 (two) times daily., Disp: 50 g, Rfl: 2 .  tamsulosin (FLOMAX) 0.4 MG CAPS capsule, Take 1 capsule (0.4 mg total) by mouth daily., Disp: 10 capsule, Rfl: 0 .  traMADol (ULTRAM) 50 MG tablet, Take 50 mg by mouth every 4 (four) hours as needed for moderate pain., Disp: , Rfl:  .  zolpidem (AMBIEN) 5 MG tablet, Take 5 mg by mouth at bedtime., Disp: , Rfl:  .  tamoxifen (NOLVADEX) 20 MG tablet, Take 1 tablet (20 mg total) by mouth daily., Disp: 30 tablet, Rfl: 3  Physical exam:  Vitals:   09/18/18 1429 09/18/18 1435  BP:  109/76  Pulse:  87  Resp: 12   Temp:  99.1 F (37.3 C)  TempSrc:  Tympanic  Weight: 180 lb 9.6 oz (81.9 kg)   Height: 5' 5" (1.651 m)    Physical Exam HENT:     Head: Normocephalic and atraumatic.  Eyes:     Pupils: Pupils are equal, round, and reactive to light.  Neck:     Musculoskeletal: Normal range of motion.  Cardiovascular:     Rate and Rhythm: Normal rate and regular rhythm.     Heart sounds: Normal heart sounds.  Pulmonary:     Effort: Pulmonary effort is normal.     Breath sounds: Normal breath sounds.  Abdominal:  General: Bowel  sounds are normal.     Palpations: Abdomen is soft.  Skin:    General: Skin is warm and dry.  Neurological:     Mental Status: She is alert and oriented to person, place, and time.   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  CMP Latest Ref Rng & Units 06/12/2018  Glucose 70 - 99 mg/dL 89  BUN 6 - 20 mg/dL 11  Creatinine 0.44 - 1.00 mg/dL 0.79  Sodium 135 - 145 mmol/L 142  Potassium 3.5 - 5.1 mmol/L 4.1  Chloride 98 - 111 mmol/L 104  CO2 22 - 32 mmol/L 28  Calcium 8.9 - 10.3 mg/dL 9.5  Total Protein 6.5 - 8.1 g/dL 7.7  Total Bilirubin 0.3 - 1.2 mg/dL 0.8  Alkaline Phos 38 - 126 U/L 66  AST 15 - 41 U/L 28  ALT 0 - 44 U/L 22   CBC Latest Ref Rng & Units 09/10/2018  WBC 4.0 - 10.5 K/uL 9.3  Hemoglobin 12.0 - 15.0 g/dL 14.6  Hematocrit 36.0 - 46.0 % 43.8  Platelets 150 - 400 K/uL 363    Assessment and plan- Patient is a 48 y.o. female who was found to have DCIS on biopsy with a focus of microinvasion. Final pathology shows stage IA pT1a pN 0cM0 invasive mammary carcinoma grade 2 5 mm ER PR positive and HER-2/neu negative status post lumpectomy.  Oncotype came back at 16 and plan was to proceed with OS plus AI and patient received her first dose of Lupron on 07/24/2018.  She could not tolerate Lupron because of headaches and is here to discuss tamoxifen  Since patient is unable to continue with OS plus AI we will plan to give her tamoxifen for 10 years given that she is still premenopausal.  If she has no menstrual cycles for 1 year on tamoxifen I will plan to check her hormone levels and switch her to AI at that time.  Discussed risks and benefits of tamoxifen including all but not limited to risk of DVT, uterine cancer, cataracts, hot flashes and mood swings.  She will finish her radiation treatment in 3 days time following which she will start taking tamoxifen.  Written information about tamoxifen has been given to the patient.  Treatment will be given with a curative  intent.  We will obtain a baseline bone density scan at this time.  I will see her back in 6 weeks time with a CMP to see how she is tolerating tamoxifen   Visit Diagnosis 1. Malignant neoplasm of upper-outer quadrant of left breast in female, estrogen receptor positive ([place])   2. Peri-menopausal      Dr. Randa Evens, MD, MPH [facility] at [facility] [phone] 09/20/2018 1:30 PM

## 2018-09-21 ENCOUNTER — Ambulatory Visit
Admission: RE | Admit: 2018-09-21 | Discharge: 2018-09-21 | Disposition: A | Discharge status: Home / Self Care | Payer: Managed Care, Other (non HMO) | Source: Ambulatory Visit | Attending: Radiation Oncology | Admitting: Radiation Oncology

## 2018-09-21 DIAGNOSIS — C50412 Malignant neoplasm of upper-outer quadrant of left female breast: Secondary | ICD-10-CM | POA: Diagnosis not present

## 2018-10-10 ENCOUNTER — Ambulatory Visit
Admission: RE | Admit: 2018-10-10 | Discharge: 2018-10-10 | Disposition: A | Discharge status: Home / Self Care | Payer: Managed Care, Other (non HMO) | Source: Ambulatory Visit | Attending: Oncology | Admitting: Oncology

## 2018-10-10 DIAGNOSIS — Z170 Estrogen receptor positive status [ER+]: Secondary | ICD-10-CM | POA: Insufficient documentation

## 2018-10-10 DIAGNOSIS — C50412 Malignant neoplasm of upper-outer quadrant of left female breast: Secondary | ICD-10-CM | POA: Insufficient documentation

## 2018-10-10 DIAGNOSIS — N951 Menopausal and female climacteric states: Secondary | ICD-10-CM

## 2018-10-10 HISTORY — DX: Malignant neoplasm of unspecified site of unspecified female breast: C50.919

## 2018-10-10 HISTORY — DX: Personal history of irradiation: Z92.3

## 2018-10-16 ENCOUNTER — Encounter: Payer: Self-pay | Admitting: Oncology

## 2018-10-22 ENCOUNTER — Telehealth: Payer: Self-pay | Admitting: *Deleted

## 2018-10-22 MED ORDER — ONDANSETRON HCL 4 MG PO TABS: 4.0000 mg | ORAL_TABLET | Freq: Three times a day (TID) | ORAL | 0 refills | Status: DC | PRN

## 2018-10-22 NOTE — Telephone Encounter (Signed)
We can give her prn zofran and see if it helps

## 2018-10-22 NOTE — Telephone Encounter (Signed)
Patient called reporting that she is still having nausea with the new medicine even though she has changed to taking it at bedtime. She is also not eating . She reports that this has been going on for 3 weeks now. Please advise

## 2018-10-24 ENCOUNTER — Other Ambulatory Visit: Payer: Self-pay

## 2018-10-24 ENCOUNTER — Ambulatory Visit
Admission: RE | Admit: 2018-10-24 | Discharge: 2018-10-24 | Disposition: A | Discharge status: Home / Self Care | Payer: Managed Care, Other (non HMO) | Source: Ambulatory Visit | Attending: Radiation Oncology | Admitting: Radiation Oncology

## 2018-10-24 ENCOUNTER — Encounter: Payer: Self-pay | Admitting: Radiation Oncology

## 2018-10-24 VITALS — BP 120/86 | HR 78 | Temp 97.8°F | Resp 18 | Wt 178.0 lb

## 2018-10-24 DIAGNOSIS — Z170 Estrogen receptor positive status [ER+]: Secondary | ICD-10-CM | POA: Insufficient documentation

## 2018-10-24 DIAGNOSIS — Z923 Personal history of irradiation: Secondary | ICD-10-CM | POA: Diagnosis not present

## 2018-10-24 DIAGNOSIS — D0512 Intraductal carcinoma in situ of left breast: Secondary | ICD-10-CM | POA: Diagnosis present

## 2018-10-24 DIAGNOSIS — Z79810 Long term (current) use of selective estrogen receptor modulators (SERMs): Secondary | ICD-10-CM | POA: Diagnosis not present

## 2018-10-24 NOTE — Progress Notes (Signed)
Radiation Oncology Follow up Note  Name: LOYS HOSELTON   Date:   10/24/2018 MRN:  [id number] DOB: 08-31-1970    This 48 y.o. female presents to the clinic today for one-month follow-up status post whole breast radiation to her left breast for stage I invasive mammary carcinoma ER/PR positive.Marland Kitchen  REFERRING PROVIDER: Juline Patch, MD  HPI: patient is a 48 year old female now out 1 month having completed external beam radiation therapy to her left breast for stage I ER/PR positive invasive mammary carcinoma. Seen today in routine follow-up she is doing well. She still has occasional shooting pains in the breast. She also is having some occasional itching continues to use 1% hydrocodone cream. She specifically denies cough or bone pain. She has started on.tamoxifen iwhich is causing some nausea. COMPLICATIONS OF TREATMENT: none  FOLLOW UP COMPLIANCE: keeps appointments   PHYSICAL EXAM:  BP 120/86 (BP Location: Left Arm, Patient Position: Sitting)   Pulse 78   Temp 97.8 F (36.6 C) (Tympanic)   Resp 18   Wt 178 lb 0.3 oz (80.7 kg)   BMI 29.62 kg/m  Lungs are clear to A&P cardiac examination essentially unremarkable with regular rate and rhythm. No dominant mass or nodularity is noted in either breast in 2 positions examined. Incision is well-healed. No axillary or supraclavicular adenopathy is appreciated. Cosmetic result is excellent.still some slight hyperpigmentation the skin.Well-developed well-nourished patient in NAD. HEENT reveals PERLA, EOMI, discs not visualized.  Oral cavity is clear. No oral mucosal lesions are identified. Neck is clear without evidence of cervical or supraclavicular adenopathy. Lungs are clear to A&P. Cardiac examination is essentially unremarkable with regular rate and rhythm without murmur rub or thrill. Abdomen is benign with no organomegaly or masses noted. Motor sensory and DTR levels are equal and symmetric in the upper and lower extremities. Cranial nerves  II through XII are grossly intact. Proprioception is intact. No peripheral adenopathy or edema is identified. No motor or sensory levels are noted. Crude visual fields are within normal range.  RADIOLOGY RESULTS: no current films for review  PLAN: present time patient is doing well with no evidence of disease. I'm please were overall progress. I've asked to see her back in 4-5 months for follow-up. Patient is to call sooner with any concerns.  I would like to take this opportunity to thank you for allowing me to participate in the care of your patient.Noreene Filbert, MD

## 2018-10-26 ENCOUNTER — Other Ambulatory Visit: Payer: Self-pay

## 2018-10-26 DIAGNOSIS — C50412 Malignant neoplasm of upper-outer quadrant of left female breast: Secondary | ICD-10-CM

## 2018-10-26 DIAGNOSIS — Z170 Estrogen receptor positive status [ER+]: Principal | ICD-10-CM

## 2018-10-29 ENCOUNTER — Other Ambulatory Visit: Payer: Self-pay | Admitting: *Deleted

## 2018-10-29 ENCOUNTER — Other Ambulatory Visit: Payer: Self-pay

## 2018-10-29 ENCOUNTER — Inpatient Hospital Stay: Payer: Managed Care, Other (non HMO) | Attending: Oncology

## 2018-10-29 ENCOUNTER — Inpatient Hospital Stay (HOSPITAL_BASED_OUTPATIENT_CLINIC_OR_DEPARTMENT_OTHER): Payer: Managed Care, Other (non HMO) | Admitting: Oncology

## 2018-10-29 VITALS — BP 108/76 | HR 76 | Temp 99.3°F | Resp 18 | Wt 177.4 lb

## 2018-10-29 DIAGNOSIS — Z87891 Personal history of nicotine dependence: Secondary | ICD-10-CM | POA: Insufficient documentation

## 2018-10-29 DIAGNOSIS — Z79810 Long term (current) use of selective estrogen receptor modulators (SERMs): Secondary | ICD-10-CM | POA: Diagnosis not present

## 2018-10-29 DIAGNOSIS — Z5181 Encounter for therapeutic drug level monitoring: Secondary | ICD-10-CM

## 2018-10-29 DIAGNOSIS — Z170 Estrogen receptor positive status [ER+]: Secondary | ICD-10-CM

## 2018-10-29 DIAGNOSIS — C50412 Malignant neoplasm of upper-outer quadrant of left female breast: Secondary | ICD-10-CM

## 2018-10-29 DIAGNOSIS — R110 Nausea: Secondary | ICD-10-CM | POA: Insufficient documentation

## 2018-10-29 DIAGNOSIS — Z853 Personal history of malignant neoplasm of breast: Secondary | ICD-10-CM

## 2018-10-29 DIAGNOSIS — Z08 Encounter for follow-up examination after completed treatment for malignant neoplasm: Secondary | ICD-10-CM

## 2018-10-29 DIAGNOSIS — N951 Menopausal and female climacteric states: Secondary | ICD-10-CM

## 2018-10-29 LAB — COMPREHENSIVE METABOLIC PANEL
ALBUMIN: 4 g/dL (ref 3.5–5.0)
ALK PHOS: 65 U/L (ref 38–126)
ALT: 17 U/L (ref 0–44)
ANION GAP: 10 (ref 5–15)
AST: 20 U/L (ref 15–41)
BUN: 15 mg/dL (ref 6–20)
CALCIUM: 8.9 mg/dL (ref 8.9–10.3)
CHLORIDE: 106 mmol/L (ref 98–111)
CO2: 24 mmol/L (ref 22–32)
Creatinine, Ser: 1.12 mg/dL — ABNORMAL HIGH (ref 0.44–1.00)
GFR calc Af Amer: >60 mL/min (ref >60)
GFR calc non Af Amer: 58 mL/min — ABNORMAL LOW (ref >60)
GLUCOSE: 103 mg/dL — AB (ref 70–99)
Potassium: 3.7 mmol/L (ref 3.5–5.1)
SODIUM: 140 mmol/L (ref 135–145)
Total Bilirubin: 0.7 mg/dL (ref 0.3–1.2)
Total Protein: 8.3 g/dL — ABNORMAL HIGH (ref 6.5–8.1)

## 2018-10-29 MED ORDER — PROCHLORPERAZINE MALEATE 10 MG PO TABS: 10.0000 mg | ORAL_TABLET | Freq: Four times a day (QID) | ORAL | 3 refills | Status: DC | PRN

## 2018-10-29 NOTE — Progress Notes (Signed)
Here for follow up. Stated overall stated nausea continues-nausea in am -takes zofran that helps for a few hours-then it returns. Takes zofran at hs-  Always nauseous in am w poor appetite. Feeling fatigued ,easily tired ,

## 2018-10-30 ENCOUNTER — Encounter: Payer: Self-pay | Admitting: Oncology

## 2018-10-30 NOTE — Progress Notes (Signed)
Hematology/Oncology Consult note [facility]  Telephone:([phone] [phone]  Patient Care Team: Juline Patch, MD as PCP - General (Family Medicine)   Name of the patient: Tiffany Humphrey  [id number]  May 01, 1971   Date of visit: 10/30/18  Diagnosis- Pathological prognostic stage I A invasive mammary carcinoma T1a pN0 cM0 ER, PR positive her 2 negative  Chief complaint/ Reason for visit-routine follow-up of breast cancer on tamoxifen  Heme/Onc history: patient is a 48 year old female whose mother was recently diagnosed with DCIS. She recently underwent a screening mammogram which showed abnormal calcifications in her left breast. Diagnostic mammogram and ultrasound confirmed 9 x 5 x 5 mm calcifications. 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 There was a single focus worrisome but not diagnostic for microinvasion. Patient has met with surgery and is tentatively scheduled to undergo lumpectomy and sentinel lymph node biopsy next week. Patient states that she has used birth control for the last 25 years. She used to have regular. But about 5 months ago she did not have her menstrual cycles for about 5 months. At that point her birth control was temporarily heldand she did have her menstrual cycle after 5 months. She did restart her birth control at that point.She feels well today and denies any complaints. Her appetite is good and she denies any unintentional weight loss. She is G1P1L1.No other family history of breast cancer other than her mother had DCIS.  Patient had a biopsy on 06/22/2018. Final pathology showed invasive mammary carcinoma 5 mm grade 2 ER greater than 90% positive PR 51 to 90% positive and HER-2/neu negative with negative margins. Sentinel lymph node was negative for malignancy. This was associated with high-grade DCIS.  oncotype score came back at 16. Plan is to proceed with OS plus AI.  However  patient had significant headache with the first dose of Lupron to the point that she could not function and headache persisted for almost a month.  Plan is to discontinue Lupron after 1 dose and switch her to tamoxifen instead  Patient will be completing adjuvant radiation treatment end of January 2020  Interval history-patient is currently taking tamoxifen at night but reports persistent nausea which developed after she started taking the drug.  She has been needing 2 doses of Zofran 1 at night and 1 early morning to get through her nausea which does not resolve until about 11 AM.  She has been skipping her breakfast and many times lunch as well to get through her nausea.  Reports that her radiation site has healed well and denies any pain  ECOG PS- 0 Pain scale- 0   Review of systems- Review of Systems  Constitutional: Negative for chills, fever, malaise/fatigue and weight loss.  HENT: Negative for congestion, ear discharge and nosebleeds.   Eyes: Negative for blurred vision.  Respiratory: Negative for cough, hemoptysis, sputum production, shortness of breath and wheezing.   Cardiovascular: Negative for chest pain, palpitations, orthopnea and claudication.  Gastrointestinal: Positive for nausea. Negative for abdominal pain, blood in stool, constipation, diarrhea, heartburn, melena and vomiting.  Genitourinary: Negative for dysuria, flank pain, frequency, hematuria and urgency.  Musculoskeletal: Negative for back pain, joint pain and myalgias.  Skin: Negative for rash.  Neurological: Negative for dizziness, tingling, focal weakness, seizures, weakness and headaches.  Endo/Heme/Allergies: Does not bruise/bleed easily.  Psychiatric/Behavioral: Negative for depression and suicidal ideas. The patient does not have insomnia.       Allergies  Allergen  Reactions  . Quinolones Hives  . Clindamycin Hives  . Codeine Other (See Comments) and Palpitations    tachycardia     Past Medical  History:  Diagnosis Date  . Anxiety   . Breast cancer ([place]) 06/2018   left breast  . Cancer ([facility])    LEFT BREAST   . Complication of anesthesia   . GERD (gastroesophageal reflux disease)   . Headache    migraines/stress, optical migraines  . History of kidney stones   . Multilevel degenerative disc disease   . Personal history of radiation therapy   . PONV (postoperative nausea and vomiting)   . Vertigo    last episode 10/2016-NO PROBLEMS SINCE SINUS SURGERY IN 2018  . Wears contact lenses      Past Surgical History:  Procedure Laterality Date  . BREAST BIOPSY Right 06/08/2016   CYSTIC PAPILLARY APOCRINE METAPLASIA.   Marland Kitchen BREAST BIOPSY Left 06/06/2018   left breast stereo X clip positive  . BREAST EXCISIONAL BIOPSY Left 06/22/2018   lumpectomy with sn and nl  . BREAST LUMPECTOMY Left 06/22/2018  . FRONTAL SINUS EXPLORATION Bilateral 12/22/2016   Procedure: FRONTAL SINUS EXPLORATION;  Surgeon: Margaretha Sheffield, MD;  Location: [place];  Service: ENT;  Laterality: Bilateral;  . IMAGE GUIDED SINUS SURGERY N/A 12/22/2016   Procedure: IMAGE GUIDED SINUS SURGERY;  Surgeon: Margaretha Sheffield, MD;  Location: [place];  Service: ENT;  Laterality: N/A;  . MAXILLARY ANTROSTOMY Bilateral 12/22/2016   Procedure: MAXILLARY ANTROSTOMY;  Surgeon: Margaretha Sheffield, MD;  Location: [place];  Service: ENT;  Laterality: Bilateral;  . PARTIAL MASTECTOMY WITH NEEDLE LOCALIZATION Left 06/22/2018   Procedure: PARTIAL MASTECTOMY WITH NEEDLE LOCALIZATION;  Surgeon: Herbert Pun, MD;  Location: [facility];  Service: General;  Laterality: Left;  . SENTINEL NODE BIOPSY Left 06/22/2018   Procedure: SENTINEL NODE BIOPSY;  Surgeon: Herbert Pun, MD;  Location: [facility];  Service: General;  Laterality: Left;  . SEPTOPLASTY N/A 12/22/2016   Procedure: SEPTOPLASTY;  Surgeon: Margaretha Sheffield, MD;  Location: [place];  Service: ENT;  Laterality: N/A;  GAVE DISK TO CECE 4-5  KP  . TURBINATE REDUCTION N/A 12/22/2016   Procedure: TURBINATE REDUCTION partial inferior;  Surgeon: Margaretha Sheffield, MD;  Location: [place];  Service: ENT;  Laterality: N/A;    Social History   Socioeconomic History  . Marital status: Married    Spouse name: Not on file  . Number of children: Not on file  . Years of education: Not on file  . Highest education level: Not on file  Occupational History  . Not on file  Social Needs  . Financial resource strain: Not on file  . Food insecurity:    Worry: Not on file    Inability: Not on file  . Transportation needs:    Medical: Not on file    Non-medical: Not on file  Tobacco Use  . Smoking status: Former Smoker    Packs/day: 1.00    Years: 15.00    Pack years: 15.00    Types: Cigarettes    Last attempt to quit: 2013    Years since quitting: 7.1  . Smokeless tobacco: Never Used  Substance and Sexual Activity  . Alcohol use: Yes    Alcohol/week: 0.0 standard drinks    Comment: social (4-6 drinks/month)  . Drug use: No  . Sexual activity: Yes  Lifestyle  . Physical activity:    Days per week: Not on file  Minutes per session: Not on file  . Stress: Not on file  Relationships  . Social connections:    Talks on phone: Not on file    Gets together: Not on file    Attends religious service: Not on file    Active member of club or organization: Not on file    Attends meetings of clubs or organizations: Not on file    Relationship status: Not on file  . Intimate partner violence:    Fear of current or ex partner: Not on file    Emotionally abused: Not on file    Physically abused: Not on file    Forced sexual activity: Not on file  Other Topics Concern  . Not on file  Social History Narrative  . Not on file    Family History  Problem Relation Age of Onset  . Breast cancer Mother        dx 75s; 2nd primary at 4  . Breast cancer Paternal Grandmother        dx 28s; deceased 40s  . Breast cancer Paternal  Aunt        dx 25s; currently 6s  . Breast cancer Cousin        dx 53s; currently late 56s; daughter of a paternal uncle  . Lung cancer Paternal Uncle        smoker; deceased     Current Outpatient Medications:  .  hydrochlorothiazide (MICROZIDE) 12.5 MG capsule, Take 12.5 mg by mouth daily. , Disp: , Rfl:  .  ibuprofen (ADVIL,MOTRIN) 200 MG tablet, Take 800 mg by mouth every 6 (six) hours as needed for headache or moderate pain., Disp: , Rfl:  .  levocetirizine (XYZAL) 5 MG tablet, Take 5 mg by mouth every morning. , Disp: , Rfl:  .  NON FORMULARY, Place 3 drops under the tongue at bedtime. Allergy Oral Drops made by [facility] , Disp: , Rfl:  .  omeprazole (PRILOSEC OTC) 20 MG tablet, Take 20 mg by mouth every morning. , Disp: , Rfl:  .  ondansetron (ZOFRAN) 4 MG tablet, Take 1 tablet (4 mg total) by mouth every 8 (eight) hours as needed for nausea or vomiting., Disp: 30 tablet, Rfl: 0 .  tamoxifen (NOLVADEX) 20 MG tablet, Take 1 tablet (20 mg total) by mouth daily., Disp: 30 tablet, Rfl: 3 .  meclizine (ANTIVERT) 25 MG tablet, Take 1 tablet (25 mg total) by mouth 3 (three) times daily as needed for dizziness. (Patient not taking: Reported on 10/29/2018), Disp: 6 tablet, Rfl: 0 .  naproxen sodium (ALEVE) 220 MG tablet, Take 880 mg by mouth daily as needed (for pain or headache)., Disp: , Rfl:  .  prochlorperazine (COMPAZINE) 10 MG tablet, Take 1 tablet (10 mg total) by mouth every 6 (six) hours as needed for nausea or vomiting., Disp: 30 tablet, Rfl: 3 .  silver sulfADIAZINE (SILVADENE) 1 % cream, Apply 1 application topically 2 (two) times daily. (Patient not taking: Reported on 10/29/2018), Disp: 50 g, Rfl: 2 .  tamsulosin (FLOMAX) 0.4 MG CAPS capsule, Take 1 capsule (0.4 mg total) by mouth daily. (Patient not taking: Reported on 10/29/2018), Disp: 10 capsule, Rfl: 0 .  traMADol (ULTRAM) 50 MG tablet, Take 50 mg by mouth every 4 (four) hours as needed for moderate pain., Disp: , Rfl:  .   zolpidem (AMBIEN) 5 MG tablet, Take 5 mg by mouth at bedtime., Disp: , Rfl:   Physical exam:  Vitals:   10/29/18 1440  BP: 108/76  Pulse: 76  Resp: 18  Temp: 99.3 F (37.4 C)  TempSrc: Tympanic  Weight: 177 lb 6.4 oz (80.5 kg)   Physical Exam Constitutional:      General: She is not in acute distress. HENT:     Head: Normocephalic and atraumatic.  Eyes:     Pupils: Pupils are equal, round, and reactive to light.  Neck:     Musculoskeletal: Normal range of motion.  Cardiovascular:     Rate and Rhythm: Normal rate and regular rhythm.     Heart sounds: Normal heart sounds.  Pulmonary:     Effort: Pulmonary effort is normal.     Breath sounds: Normal breath sounds.  Abdominal:     General: Bowel sounds are normal.     Palpations: Abdomen is soft.  Skin:    General: Skin is warm and dry.  Neurological:     Mental Status: She is alert and oriented to person, place, and time.      CMP Latest Ref Rng & Units 10/29/2018  Glucose 70 - 99 mg/dL 103(H)  BUN 6 - 20 mg/dL 15  Creatinine 0.44 - 1.00 mg/dL 1.12(H)  Sodium 135 - 145 mmol/L 140  Potassium 3.5 - 5.1 mmol/L 3.7  Chloride 98 - 111 mmol/L 106  CO2 22 - 32 mmol/L 24  Calcium 8.9 - 10.3 mg/dL 8.9  Total Protein 6.5 - 8.1 g/dL 8.3(H)  Total Bilirubin 0.3 - 1.2 mg/dL 0.7  Alkaline Phos 38 - 126 U/L 65  AST 15 - 41 U/L 20  ALT 0 - 44 U/L 17   CBC Latest Ref Rng & Units 09/10/2018  WBC 4.0 - 10.5 K/uL 9.3  Hemoglobin 12.0 - 15.0 g/dL 14.6  Hematocrit 36.0 - 46.0 % 43.8  Platelets 150 - 400 K/uL 363    No images are attached to the encounter.  Dg Bone Density  Result Date: 10/10/2018 EXAM: DUAL X-RAY ABSORPTIOMETRY (DXA) FOR BONE MINERAL DENSITY IMPRESSION: Technologist:: [facility] Your patient Kamoni Depree completed a BMD test on 10/10/2018 using the [place] (analysis version: 14.10) manufactured by [employer]. The following summarizes the results of our evaluation. PATIENT BIOGRAPHICAL: Name: Lynnell, Fiumara Patient ID: [id number] Birth Date: November 22, 1970 Height: 65.0 in. Gender: Female Exam Date: 10/10/2018 Weight: 177.0 lbs. Indications: Caucasian, High Risk Meds, History of Breast Cancer on hormone therapy, History of Radiation, Premenopausal Fractures: Right elbow, Toes Treatments: Tamoxifen ASSESSMENT: The BMD measured at Femur Neck Left is 0.766 g/cm2 with a Z-score of -2.0. The Z-score is within the expected range for age. ISCD recommends using Z-scores for assessment of pre-menopausal women, men between 67 and 64 yoa, and children under 69 yoa. The diagnosis of osteoporosis in these patients should not be made on the basis of densitometric criteria alone. L-3 & 4 was excluded due to artifact from belly button ring which could not be removed by patient. The quality of the scan is good. Site Region Measured Measured WHO Age Matched BMD Date       Age      Classification Z-Score AP Spine L1-L2 10/10/2018 47.0 N/A -0.7 1.069 g/cm2 DualFemur Neck Left 10/10/2018 47.0 N/A -1.3 0.766 g/cm2 World Health Organization [facility]) criteria for post-menopausal, Caucasian Women: Normal:       T-score at or above -1 SD Osteopenia:   T-score between -1 and -2.5 SD Osteoporosis: T-score at or below -2.5 SD RECOMMENDATIONS: All patients should optimize calcium and vitamin D intake. FOLLOW-UP: People with diagnosed  cases of osteoporosis or at high risk for fracture should have regular bone mineral density tests. For patients eligible for Medicare, routine testing is allowed once every 2 years. The testing frequency can be increased to one year for patients who have rapidly progressing disease, those who are receiving or discontinuing medical therapy to restore bone mass, or have additional risk factors. Electronically Signed   By: Earle Gell M.D.   On: 10/10/2018 11:16     Assessment and plan- Patient is a 48 y.o. female who was found to have DCIS on biopsy with a focus of microinvasion. Final pathology shows stage IA pT1a pN  0cM0 invasive mammary carcinoma grade 2 5 mm ER PR positive and HER-2/neu negative status post lumpectomy.Oncotype came back at 16 and plan was to proceed with OS plus AI and patient received her first dose of Lupron on 07/24/2018.  She could not tolerate Lupron and was switched to tamoxifen.  She is here for routine follow-up of breast cancer on tamoxifen  Patient has been mainly complaining of nausea since she started taking tamoxifen.  She is requiring about 2 doses of Zofran for nausea control.  I will also give her a prescription for as needed Compazine at this time and see if that would work better for her as compared to Zofran.  I am hoping that the side effects will subside with time and she is able to continue taking tamoxifen for 5 years without having to take daily antinausea medications.  Patient was also asking about contraception options.  She is currently using barrier protection with condoms.  Discussed that she should not be using any hormone contraceptive methods such as OC pills or implantable hormone contraceptives either.  Mirena also has some hormonal component to it and I would either recommend copper IUD or talking to her GYN about it reversible methods such as tubal ligation.  Patient does not desire any further children at this time and is open to considering tubal ligation at this time  I will see her back in 3 months time for routine follow-up and she will call us in the interim if she has any questions or concerns   Visit Diagnosis 1. Encounter for monitoring tamoxifen therapy   2. Encounter for follow-up surveillance of breast cancer      Dr. Randa Evens, MD, MPH [facility] at [facility] [phone] 10/30/2018 12:03 PM

## 2018-11-02 ENCOUNTER — Encounter: Payer: Self-pay | Admitting: Oncology

## 2018-12-04 ENCOUNTER — Encounter: Payer: Self-pay | Admitting: Oncology

## 2018-12-05 ENCOUNTER — Other Ambulatory Visit: Payer: Self-pay | Admitting: *Deleted

## 2018-12-05 ENCOUNTER — Encounter: Payer: Self-pay | Admitting: *Deleted

## 2018-12-05 MED ORDER — GABAPENTIN 300 MG PO CAPS: 300.0000 mg | ORAL_CAPSULE | ORAL | 0 refills | Status: DC

## 2018-12-12 ENCOUNTER — Encounter: Payer: Self-pay | Admitting: Oncology

## 2018-12-13 ENCOUNTER — Encounter: Payer: Self-pay | Admitting: Oncology

## 2018-12-17 ENCOUNTER — Encounter: Payer: Self-pay | Admitting: Oncology

## 2018-12-18 ENCOUNTER — Other Ambulatory Visit: Payer: Self-pay

## 2018-12-19 ENCOUNTER — Inpatient Hospital Stay: Payer: Managed Care, Other (non HMO) | Attending: Radiation Oncology | Admitting: Oncology

## 2018-12-19 ENCOUNTER — Other Ambulatory Visit: Payer: Self-pay | Admitting: *Deleted

## 2018-12-19 ENCOUNTER — Encounter: Payer: Self-pay | Admitting: Oncology

## 2018-12-19 ENCOUNTER — Other Ambulatory Visit: Payer: Self-pay

## 2018-12-19 VITALS — BP 119/74 | HR 84 | Temp 98.1°F | Ht 65.0 in | Wt 180.0 lb

## 2018-12-19 DIAGNOSIS — Z87891 Personal history of nicotine dependence: Secondary | ICD-10-CM | POA: Diagnosis not present

## 2018-12-19 DIAGNOSIS — N63 Unspecified lump in unspecified breast: Secondary | ICD-10-CM

## 2018-12-19 DIAGNOSIS — Z170 Estrogen receptor positive status [ER+]: Secondary | ICD-10-CM | POA: Diagnosis not present

## 2018-12-19 DIAGNOSIS — N644 Mastodynia: Secondary | ICD-10-CM | POA: Diagnosis not present

## 2018-12-19 DIAGNOSIS — Z79899 Other long term (current) drug therapy: Secondary | ICD-10-CM | POA: Insufficient documentation

## 2018-12-19 DIAGNOSIS — C50412 Malignant neoplasm of upper-outer quadrant of left female breast: Secondary | ICD-10-CM

## 2018-12-19 MED ORDER — PREDNISONE 10 MG PO TABS: 10.0000 mg | ORAL_TABLET | ORAL | 0 refills | Status: DC

## 2018-12-19 NOTE — Progress Notes (Signed)
Patient stated that she had been having some vein swelling on her left breast. Patient describes it as pulling, tender, and nipple pain. Patient stated that there is no nipple discharge. Patient stated that her right breast has no issues. Patient stated that she finished her radiation treatment in January 2020.

## 2018-12-20 ENCOUNTER — Encounter: Payer: Self-pay | Admitting: *Deleted

## 2018-12-20 NOTE — Progress Notes (Signed)
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 She recently underwent a screening mammogram which showed abnormal calcifications in her left breast. Diagnostic mammogram and ultrasound confirmed 9 x 5 x 5 mm calcifications. 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 There was a single focus worrisome but not diagnostic for microinvasion. Patient has met with surgery and is tentatively scheduled to undergo lumpectomy and sentinel lymph node biopsy next week. Patient states that she has used birth control for the last 25 years. She used to have regular. But about 5 months ago she did not have her menstrual cycles for about 5 months. At that point her birth control was temporarily heldand she did have her menstrual cycle after 5 months. She did restart her birth control at that point.She feels well today and denies any complaints. Her appetite is good and she denies any unintentional weight loss. She is G1P1L1.No other family history of breast cancer other than her mother had DCIS.  Patient had a biopsy on 06/22/2018. Final pathology showed invasive mammary carcinoma 5 mm grade 2 ER greater than 90% positive PR 51 to 90% positive and HER-2/neu negative with negative margins. Sentinel lymph node was negative for malignancy. This was associated with high-grade DCIS.  oncotype score came back at 16. Plan is to proceed with OS plus AI. However  patient had significant headache with the first dose of Lupron to the point that she could not function and headache persisted for almost a month. Plan is to discontinue Lupron after 1 dose and switch her to tamoxifen instead  Patient will be completing adjuvant radiation treatment end of January 2020  Interval history-patient reports that she had some left breast pain and sensitivity as well as weakness engorgement in the undersurface of her left breast when she was undergoing radiation treatment back in January.  It resolved on its own and she was doing well for a while when these symptoms have come back for the last 1 week.  She feels burning pain in her left breast along with extreme nipple sensitivity.  She also feels that her veins on the undersurface of the left breast are engorged.  She has been trying to use tramadol that she has but that has not been helping her.  Patient was also switched to tamoxifen for hormone therapy and she had significant hot flashes as well as nausea which have now improved.  ECOG PS- 0 Pain scale- 3 Opioid associated constipation- no  Review of systems- Review of Systems  Constitutional: Negative for chills, fever, malaise/fatigue and weight loss.  HENT: Negative for congestion, ear discharge and nosebleeds.   Eyes: Negative for blurred vision.  Respiratory: Negative for cough, hemoptysis, sputum production, shortness of breath and wheezing.   Cardiovascular: Negative for chest pain, palpitations, orthopnea and claudication.  Gastrointestinal: Negative for abdominal pain, blood in stool, constipation, diarrhea, heartburn, melena, nausea and vomiting.  Genitourinary: Negative for dysuria, flank pain, frequency, hematuria and urgency.  Musculoskeletal: Negative for  back pain, joint pain and myalgias.  Skin: Negative for rash.  Neurological: Negative for dizziness, tingling, focal weakness, seizures, weakness and headaches.  Endo/Heme/Allergies: Does not  bruise/bleed easily.  Psychiatric/Behavioral: Negative for depression and suicidal ideas. The patient does not have insomnia.   Left breast pain   Allergies  Allergen Reactions   Quinolones Hives   Clindamycin Hives   Codeine Other (See Comments) and Palpitations    tachycardia     Past Medical History:  Diagnosis Date   Anxiety    Breast cancer ([place]) 06/2018   left breast   Cancer ([place])    LEFT BREAST    Complication of anesthesia    GERD (gastroesophageal reflux disease)    Headache    migraines/stress, optical migraines   History of kidney stones    Multilevel degenerative disc disease    Personal history of radiation therapy    PONV (postoperative nausea and vomiting)    Vertigo    last episode 10/2016-NO PROBLEMS SINCE SINUS SURGERY IN 2018   Wears contact lenses      Past Surgical History:  Procedure Laterality Date   BREAST BIOPSY Right 06/08/2016   CYSTIC PAPILLARY APOCRINE METAPLASIA.    BREAST BIOPSY Left 06/06/2018   left breast stereo X clip positive   BREAST EXCISIONAL BIOPSY Left 06/22/2018   lumpectomy with sn and nl   BREAST LUMPECTOMY Left 06/22/2018   FRONTAL SINUS EXPLORATION Bilateral 12/22/2016   Procedure: FRONTAL SINUS EXPLORATION;  Surgeon: Margaretha Sheffield, MD;  Location: [place];  Service: ENT;  Laterality: Bilateral;   IMAGE GUIDED SINUS SURGERY N/A 12/22/2016   Procedure: IMAGE GUIDED SINUS SURGERY;  Surgeon: Margaretha Sheffield, MD;  Location: [place];  Service: ENT;  Laterality: N/A;   MAXILLARY ANTROSTOMY Bilateral 12/22/2016   Procedure: MAXILLARY ANTROSTOMY;  Surgeon: Margaretha Sheffield, MD;  Location: [place];  Service: ENT;  Laterality: Bilateral;   PARTIAL MASTECTOMY WITH NEEDLE LOCALIZATION Left 06/22/2018   Procedure: PARTIAL MASTECTOMY WITH NEEDLE LOCALIZATION;  Surgeon: Herbert Pun, MD;  Location: [facility];  Service: General;  Laterality: Left;   SENTINEL NODE BIOPSY Left  06/22/2018   Procedure: SENTINEL NODE BIOPSY;  Surgeon: Herbert Pun, MD;  Location: [facility];  Service: General;  Laterality: Left;   SEPTOPLASTY N/A 12/22/2016   Procedure: SEPTOPLASTY;  Surgeon: Margaretha Sheffield, MD;  Location: [place];  Service: ENT;  Laterality: N/A;  GAVE DISK TO CECE 4-5 KP   TURBINATE REDUCTION N/A 12/22/2016   Procedure: TURBINATE REDUCTION partial inferior;  Surgeon: Margaretha Sheffield, MD;  Location: [place];  Service: ENT;  Laterality: N/A;    Social History   Socioeconomic History   Marital status: Married    Spouse name: Not on file   Number of children: Not on file   Years of education: Not on file   Highest education level: Not on file  Occupational History   Not on file  Social Needs   Financial resource strain: Not on file   Food insecurity:    Worry: Not on file    Inability: Not on file   Transportation needs:    Medical: Not on file    Non-medical: Not on file  Tobacco Use   Smoking status: Former Smoker    Packs/day: 1.00    Years: 15.00    Pack years: 15.00    Types: Cigarettes    Last attempt to quit: 2013    Years since quitting: 7.3   Smokeless tobacco: Never  Used  Substance and Sexual Activity   Alcohol use: Yes    Alcohol/week: 0.0 standard drinks    Comment: social (4-6 drinks/month)   Drug use: No   Sexual activity: Yes  Lifestyle   Physical activity:    Days per week: Not on file    Minutes per session: Not on file   Stress: Not on file  Relationships   Social connections:    Talks on phone: Not on file    Gets together: Not on file    Attends religious service: Not on file    Active member of club or organization: Not on file    Attends meetings of clubs or organizations: Not on file    Relationship status: Not on file   Intimate partner violence:    Fear of current or ex partner: Not on file    Emotionally abused: Not on file    Physically abused: Not on file    Forced  sexual activity: Not on file  Other Topics Concern   Not on file  Social History Narrative   Not on file    Family History  Problem Relation Age of Onset   Breast cancer Mother        dx 54s; 2nd primary at 66   Breast cancer Paternal Grandmother        dx 71s; deceased 31s   Breast cancer Paternal Aunt        dx 63s; currently 65s   Breast cancer Cousin        dx 60s; currently late 33s; daughter of a paternal uncle   Lung cancer Paternal Uncle        smoker; deceased     Current Outpatient Medications:    gabapentin (NEURONTIN) 300 MG capsule, Take 1 capsule (300 mg total) by mouth as directed. 1 capsule at night x 4 days and then 2 capsules at night rest of month, Disp: 56 capsule, Rfl: 0   hydrochlorothiazide (MICROZIDE) 12.5 MG capsule, Take 12.5 mg by mouth daily. , Disp: , Rfl:    ibuprofen (ADVIL,MOTRIN) 200 MG tablet, Take 800 mg by mouth every 6 (six) hours as needed for headache or moderate pain., Disp: , Rfl:    levocetirizine (XYZAL) 5 MG tablet, Take 5 mg by mouth every morning. , Disp: , Rfl:    meclizine (ANTIVERT) 25 MG tablet, Take 1 tablet (25 mg total) by mouth 3 (three) times daily as needed for dizziness., Disp: 6 tablet, Rfl: 0   NON FORMULARY, Place 3 drops under the tongue at bedtime. Allergy Oral Drops made by [facility] , Disp: , Rfl:    omeprazole (PRILOSEC OTC) 20 MG tablet, Take 20 mg by mouth every morning. , Disp: , Rfl:    prochlorperazine (COMPAZINE) 10 MG tablet, Take 1 tablet (10 mg total) by mouth every 6 (six) hours as needed for nausea or vomiting., Disp: 30 tablet, Rfl: 3   tamoxifen (NOLVADEX) 20 MG tablet, Take 1 tablet (20 mg total) by mouth daily., Disp: 30 tablet, Rfl: 3   tamsulosin (FLOMAX) 0.4 MG CAPS capsule, Take 1 capsule (0.4 mg total) by mouth daily., Disp: 10 capsule, Rfl: 0   traMADol (ULTRAM) 50 MG tablet, Take 50 mg by mouth every 4 (four) hours as needed for moderate pain., Disp: , Rfl:    zolpidem  (AMBIEN) 5 MG tablet, Take 5 mg by mouth at bedtime., Disp: , Rfl:    predniSONE (DELTASONE) 10 MG tablet, Take 1 tablet (10  mg total) by mouth as directed. Day 1 take 4 tablets, day 2 take 4 tablets, day 3 take 3 tablets, day 4 day 2 tablets, day 5 take 1 tablet. Always take with food and take in am, Disp: 14 tablet, Rfl: 0  Physical exam:  Vitals:   12/19/18 0859  BP: 119/74  Pulse: 84  Temp: 98.1 F (36.7 C)  TempSrc: Tympanic  Weight: 180 lb (81.6 kg)  Height: 5' 5" (1.651 m)   Physical Exam Constitutional:      General: She is not in acute distress. HENT:     Head: Normocephalic and atraumatic.  Eyes:     Pupils: Pupils are equal, round, and reactive to light.  Neck:     Musculoskeletal: Normal range of motion.  Cardiovascular:     Rate and Rhythm: Normal rate and regular rhythm.     Heart sounds: Normal heart sounds.  Pulmonary:     Effort: Pulmonary effort is normal.     Breath sounds: Normal breath sounds.  Abdominal:     General: Bowel sounds are normal.     Palpations: Abdomen is soft.  Skin:    General: Skin is warm and dry.  Neurological:     Mental Status: She is alert and oriented to person, place, and time.   No palpable masses in the left breast.  No palpable left axillary adenopathy.  Skin over her left breast is normal to touch and there is no local warmth erythema.  No nipple discharge.  There is some venous engorgement seen over the undersurface of the left breast.  There is mild diffuse tenderness to palpation of the left breast  CMP Latest Ref Rng & Units 10/29/2018  Glucose 70 - 99 mg/dL 103(H)  BUN 6 - 20 mg/dL 15  Creatinine 0.44 - 1.00 mg/dL 1.12(H)  Sodium 135 - 145 mmol/L 140  Potassium 3.5 - 5.1 mmol/L 3.7  Chloride 98 - 111 mmol/L 106  CO2 22 - 32 mmol/L 24  Calcium 8.9 - 10.3 mg/dL 8.9  Total Protein 6.5 - 8.1 g/dL 8.3(H)  Total Bilirubin 0.3 - 1.2 mg/dL 0.7  Alkaline Phos 38 - 126 U/L 65  AST 15 - 41 U/L 20  ALT 0 - 44 U/L 17   CBC  Latest Ref Rng & Units 09/10/2018  WBC 4.0 - 10.5 K/uL 9.3  Hemoglobin 12.0 - 15.0 g/dL 14.6  Hematocrit 36.0 - 46.0 % 43.8  Platelets 150 - 400 K/uL 363      Assessment and plan- Patient is a 48 y.o. female who was found to have DCIS on biopsy with a focus of microinvasion. Final pathology shows stage IA pT1a pN 0cM0 invasive mammary carcinoma grade 2 5 mm ER PR positive and HER-2/neu negative status post lumpectomy.Oncotype came back at 16 and plan was to proceed with OS plus AI and patient received her first dose of Lupron on 07/24/2018.She could not tolerate Lupron and was switched to tamoxifen.  She is here for acute visit for left breast pain and nipple sensitivity  1.  I suspect her left breast pain and sensitivity is secondary to an element of lymphedema.  I do not suspect any acute mastitis based on clinical exam.  I will obtain a left breast ultrasound nevertheless to rule out any underlying infection.  I will give her a short course of steroids to see if that would decrease her inflammation and venous engorgement.  We will start her on prednisone at 40 mg and taper  it down to 5 mg and then stop in 1 week's time.  I will also get in touch with physical therapy to see if they can recommend some exercises that would help her if there is any lymphedema involving her left breast.  If patient's symptoms persist despite these measures-I will touch base with Dr. Peyton Najjar.  I do not suspect that her ongoing symptoms are secondary to tamoxifen and she will continue to take tamoxifen daily.  I will see her back in June 2020 as scheduled.   Visit Diagnosis 1. Malignant neoplasm of upper-outer quadrant of left breast in female, estrogen receptor positive ([place])   2. Breast pain, left   3. Breast swelling      Dr. Randa Evens, MD, MPH [facility] [phone] 12/20/2018 10:52 AM

## 2018-12-26 ENCOUNTER — Other Ambulatory Visit: Payer: Self-pay | Admitting: Oncology

## 2018-12-26 ENCOUNTER — Ambulatory Visit
Admission: RE | Admit: 2018-12-26 | Discharge: 2018-12-26 | Disposition: A | Discharge status: Home / Self Care | Payer: Managed Care, Other (non HMO) | Source: Ambulatory Visit | Attending: Oncology | Admitting: Oncology

## 2018-12-26 ENCOUNTER — Other Ambulatory Visit: Payer: Self-pay

## 2018-12-26 DIAGNOSIS — C50412 Malignant neoplasm of upper-outer quadrant of left female breast: Secondary | ICD-10-CM | POA: Diagnosis present

## 2018-12-26 DIAGNOSIS — Z170 Estrogen receptor positive status [ER+]: Secondary | ICD-10-CM | POA: Diagnosis present

## 2018-12-26 DIAGNOSIS — Z171 Estrogen receptor negative status [ER-]: Principal | ICD-10-CM

## 2018-12-26 DIAGNOSIS — N63 Unspecified lump in unspecified breast: Secondary | ICD-10-CM | POA: Insufficient documentation

## 2018-12-26 DIAGNOSIS — N644 Mastodynia: Secondary | ICD-10-CM | POA: Diagnosis present

## 2018-12-27 ENCOUNTER — Telehealth: Payer: Self-pay | Admitting: *Deleted

## 2018-12-27 ENCOUNTER — Other Ambulatory Visit: Payer: Self-pay | Admitting: Oncology

## 2018-12-27 ENCOUNTER — Encounter: Payer: Self-pay | Admitting: *Deleted

## 2018-12-27 ENCOUNTER — Encounter: Payer: Self-pay | Admitting: Oncology

## 2018-12-27 MED ORDER — GABAPENTIN 300 MG PO CAPS: 300.0000 mg | ORAL_CAPSULE | ORAL | 0 refills | Status: DC

## 2018-12-27 NOTE — Telephone Encounter (Signed)
Left message on patient voice mail that Dr Janese Banks sent in prescription for her Gabapentin

## 2018-12-27 NOTE — Telephone Encounter (Signed)
Patient called asking that Judeen Hammans return her call and that she can't seem to get her prescription for gabapentin refilled and wants to know why

## 2018-12-28 ENCOUNTER — Encounter: Payer: Self-pay | Admitting: *Deleted

## 2018-12-28 ENCOUNTER — Other Ambulatory Visit: Payer: Self-pay | Admitting: *Deleted

## 2018-12-28 MED ORDER — GABAPENTIN 300 MG PO CAPS: 300.0000 mg | ORAL_CAPSULE | ORAL | 0 refills | Status: DC

## 2018-12-31 ENCOUNTER — Ambulatory Visit: Payer: Managed Care, Other (non HMO) | Attending: Oncology | Admitting: Occupational Therapy

## 2018-12-31 ENCOUNTER — Encounter: Payer: Self-pay | Admitting: Occupational Therapy

## 2018-12-31 ENCOUNTER — Other Ambulatory Visit: Payer: Self-pay

## 2018-12-31 DIAGNOSIS — I972 Postmastectomy lymphedema syndrome: Secondary | ICD-10-CM | POA: Diagnosis present

## 2018-12-31 DIAGNOSIS — N644 Mastodynia: Secondary | ICD-10-CM | POA: Insufficient documentation

## 2018-12-31 DIAGNOSIS — L905 Scar conditions and fibrosis of skin: Secondary | ICD-10-CM | POA: Diagnosis present

## 2018-12-31 NOTE — Therapy (Signed)
[facility] [address], [place], [address] Phone: [phone]   Fax:  [phone]  Occupational Therapy Evaluation  Patient Details  Name: Tiffany Humphrey MRN: [id number] Date of Birth: [birth year] Referring Provider (OT): Dr Janese Banks   Encounter Date: 12/31/2018  OT End of Session - 12/31/18 1704    Visit Number  1    Number of Visits  5    Date for OT Re-Evaluation  02/04/19    OT Start Time  1300    OT Stop Time  1355    OT Time Calculation (min)  55 min    Activity Tolerance  Patient tolerated treatment well    Behavior During Therapy  [facility] for tasks assessed/performed       Past Medical History:  Diagnosis Date  . Anxiety   . Breast cancer ([place]) 06/2018   left breast  . Cancer ([facility])    LEFT BREAST   . Complication of anesthesia   . GERD (gastroesophageal reflux disease)   . Headache    migraines/stress, optical migraines  . History of kidney stones   . Multilevel degenerative disc disease   . Personal history of radiation therapy   . PONV (postoperative nausea and vomiting)   . Vertigo    last episode 10/2016-NO PROBLEMS SINCE SINUS SURGERY IN 2018  . Wears contact lenses     Past Surgical History:  Procedure Laterality Date  . BREAST BIOPSY Right 06/08/2016   CYSTIC PAPILLARY APOCRINE METAPLASIA.   Marland Kitchen BREAST BIOPSY Left 06/06/2018   left breast stereo X clip positive  . BREAST EXCISIONAL BIOPSY Left 06/22/2018   lumpectomy with sn and nl  . BREAST LUMPECTOMY Left 06/22/2018  . FRONTAL SINUS EXPLORATION Bilateral 12/22/2016   Procedure: FRONTAL SINUS EXPLORATION;  Surgeon: Margaretha Sheffield, MD;  Location: [place];  Service: ENT;  Laterality: Bilateral;  . IMAGE GUIDED SINUS SURGERY N/A 12/22/2016   Procedure: IMAGE GUIDED SINUS SURGERY;  Surgeon: Margaretha Sheffield, MD;  Location: [place];  Service: ENT;  Laterality: N/A;  . MAXILLARY ANTROSTOMY Bilateral 12/22/2016   Procedure: MAXILLARY ANTROSTOMY;  Surgeon: Margaretha Sheffield, MD;  Location: [place];  Service: ENT;  Laterality: Bilateral;  . PARTIAL MASTECTOMY WITH NEEDLE LOCALIZATION Left 06/22/2018   Procedure: PARTIAL MASTECTOMY WITH NEEDLE LOCALIZATION;  Surgeon: Herbert Pun, MD;  Location: [facility];  Service: General;  Laterality: Left;  . SENTINEL NODE BIOPSY Left 06/22/2018   Procedure: SENTINEL NODE BIOPSY;  Surgeon: Herbert Pun, MD;  Location: [facility];  Service: General;  Laterality: Left;  . SEPTOPLASTY N/A 12/22/2016   Procedure: SEPTOPLASTY;  Surgeon: Margaretha Sheffield, MD;  Location: [place];  Service: ENT;  Laterality: N/A;  GAVE DISK TO CECE 4-5 KP  . TURBINATE REDUCTION N/A 12/22/2016   Procedure: TURBINATE REDUCTION partial inferior;  Surgeon: Margaretha Sheffield, MD;  Location: [place];  Service: ENT;  Laterality: N/A;    There were no vitals filed for this visit.  Subjective Assessment - 12/31/18 1657    Subjective   My L breast since 3 wks ago - as so painfull, tender, tight , full feeling - not as tight after prednisone - but still painfull and tender - about 6/10 pain - during radiiation it was like this but got better     Pertinent History  Patient had needle guided partial mastectomy and SLNBx for DCIS on 10/25 . Final pathology came positive for invasive mammary carcinoma. Sentinel lymph  node negative. Margins were clear - on chemo drug -and radiation was finished on 09/21/2018     Patient Stated Goals  I just want the pain better in my L breast and that it feels better be more the size than my R one    Currently in Pain?  Yes    Pain Score  6     Pain Location  Breast    Pain Orientation  Left    Pain Descriptors / Indicators  Tender;Tightness;Heaviness;Sharp;Shooting;Pressure    Pain Type  Acute pain    Pain Onset  1 to 4 weeks ago    Pain Frequency  Constant        [facility] OT Assessment - 12/31/18 0001      Assessment   Medical  Diagnosis  L breast lymphedema     Referring Provider (OT)  Dr Janese Banks    Onset Date/Surgical Date  06/22/18    Hand Dominance  Right    Next MD Visit  tomorrow with surgeon      Home  Environment   Lives With  Family      Prior Function   Vocation  Full time employment    Leisure  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, farm with animals to take care of       AROM   Overall AROM Comments  AROM for shoulder WNL - slight pull in axilla at end range on L        LYMPHEDEMA/ONCOLOGY QUESTIONNAIRE - 12/31/18 1659      Right Upper Extremity Lymphedema   15 cm Proximal to Olecranon Process  35.4 cm    10 cm Proximal to Olecranon Process  31.4 cm    Olecranon Process  26 cm    15 cm Proximal to Ulnar Styloid Process  24.5 cm    10 cm Proximal to Ulnar Styloid Process  21.4 cm    Just Proximal to Ulnar Styloid Process  16.5 cm    Across Hand at [employer]  18.5 cm    At [place] of 2nd Digit  6.5 cm    At [facility] of Thumb  6.5 cm      Left Upper Extremity Lymphedema   15 cm Proximal to Olecranon Process  34.5 cm    10 cm Proximal to Olecranon Process  31.6 cm    Olecranon Process  25.5 cm    15 cm Proximal to Ulnar Styloid Process  23.6 cm    10 cm Proximal to Ulnar Styloid Process  20.5 cm    Just Proximal to Ulnar Styloid Process  17 cm    Across Hand at [employer]  18.4 cm    At [place] of 2nd Digit  6.4 cm    At [facility] of Thumb  6.5 cm        [place]  Manual Lymph Drainage  Do manual lymph drainage once each day to help decrease swelling.  This should take you about 30 minutes depending on the size of your limb.  For Left arm :  . Hug yourself at the base of your neck and do 8 small circles, and 2 fingers behind clavicle 8 x  . Do 8 semicircles at Right armpit and left groin . Pump across chest from left to right 8 times . Pump down the left side of trunk from armpit to groin 8 times . Do 8 semicircles at right armpit and left groin 8 times . Repeat nr.1  SLOW  and LIGHT with only your palm NOT FINGERTIPS If help at home can replace or do also massage over upper back from right to left , when doing chest from right to left.          Massage bottom scar 3 min  side<> side  Scar - pull breast up and massage scar - 1-2 min  And then side massage - 2 min top<> bottom   Deep breathing - 10 reps AM and PM   Jovipak - breastpad - unilateral post mastectomy bra - [employer] - [address]              OT Education - 12/31/18 1703    Education Details  findings of eval ,lymphedema symptoms -and Homeprogram     Person(s) Educated  Patient    Methods  Explanation;Demonstration;Verbal cues;Handout    Comprehension  Verbalized understanding;Returned demonstration       OT Short Term Goals - 12/31/18 1715      OT SHORT TERM GOAL #1   Title  Pt to be independent in HEP to decrease pain to less than 2 /10 in L breast     Baseline  no knowledge and pain 6/10     Time  2    Period  Weeks    Status  New    Target Date  01/14/19        OT Long Term Goals - 12/31/18 1716      OT LONG TERM GOAL #1   Title  Pt's L breast pain and fibrosis improve for pt to have no increase symtomps during use of L UE , sleeping or wearing clothes     Baseline  pain 6/10 - cannot tolerate pressure , touching , massage - or sleeping on L breast - tender over rib cage under and lateral breast     Time  5    Period  Weeks    Status  New    Target Date  02/04/19      OT LONG TERM GOAL #2   Title  Pt to be independent in homeprogram of MLD , soft tissue mobs ,and wearing of jovipak breast pad to stay pain free and symptom free  in L breast     Baseline  no knowledge     Time  5    Period  Weeks    Status  New    Target Date  02/04/19            Plan - 12/31/18 1704    Clinical Impression Statement  Pt present at OT eval with diagnosis of lymphedema of L breast after surgery and radiation - pt symptoms included since 3 wks ago increase   tightness, heaviness, fullness, tenderness , shooting pain in  L breast  - mostly on bottoms underneath and  at scar and lateral breast  - pain about 6/10 per pt -  pt show increase scar tissue from surgery and radiation , increase fibrosis  and lymphedema - pt pain decrease to 4/10 and fibrosis got softer after manual therapy by OT - pt ed on doing MLD and manual soft issue mobs to L breast - recommend also for pt to get jovipak breast pad - unilateral matectomy pad to wear under sports bra or camisole - pt to hold off on any under wire bras - will follow up with pt in week     OT Occupational Profile and History  Problem Focused Assessment - Including review  of records relating to presenting problem    Occupational performance deficits (Please refer to evaluation for details):  ADL's;IADL's;Play;Leisure;Rest and Sleep;Social Participation    Body Structure / Function / Physical Skills  ADL;Scar mobility;Edema;Pain;UE functional use    Rehab Potential  Good    Clinical Decision Making  Limited treatment options, no task modification necessary    Comorbidities Affecting Occupational Performance:  None    Modification or Assistance to Complete Evaluation   No modification of tasks or assist necessary to complete eval    OT Frequency  1x / week    OT Duration  --   5 wks    OT Treatment/Interventions  Self-care/ADL training;Therapeutic exercise;Scar mobilization;Manual Therapy;Manual lymph drainage;Patient/family education    Plan  assess progress in her pain , fibrosis and lymphedema signs doing HEP     OT Home Exercise Plan  see pt instructions     Consulted and Agree with Plan of Care  Patient       Patient will benefit from skilled therapeutic intervention in order to improve the following deficits and impairments:  Body Structure / Function / Physical Skills  Visit Diagnosis: Postmastectomy lymphedema syndrome - Plan: Ot plan of care cert/re-cert  Breast pain, left - Plan: Ot plan of care  cert/re-cert  Scar condition and fibrosis of skin - Plan: Ot plan of care cert/re-cert    Problem List Patient Active Problem List   Diagnosis Date Noted  . Breast cancer ([place]) 06/18/2018  . Migraines 08/03/2015    Rosalyn Gess OTR/L,CLT 12/31/2018, 5:22 PM  [facility] [address] [place], [address] Phone: [phone]   Fax:  [phone]  Name: Tiffany Humphrey MRN: [id number] Date of Birth: [birth year]

## 2018-12-31 NOTE — Patient Instructions (Signed)
[place]  Manual Lymph Drainage  Do manual lymph drainage once each day to help decrease swelling.  This should take you about 10 minutes depending on the size of your limb.  For Left breast :  . Hug yourself at the base of your neck and do 8 small circles, and 2 fingers behind clavicle 8 x  . Do 8 semicircles at Right armpit and left groin . Pump across chest from left to right 8 times . Pump down the left side of trunk from armpit to groin 8 times . Do 8 semicircles at right armpit and left groin 8 times . Repeat nr.1  SLOW and LIGHT with only your palm NOT FINGERTIPS If help at home can replace or do also massage over upper back from right to left , when doing chest from right to left.         Massage bottom scar 3 min  side<> side  Scar - pull breast up and massage scar - 1-2 min  And then side massage - 2 min top<> bottom   Deep breathing - 10 reps AM and PM   Jovipak - breastpad - unilateral post mastectomy bra - [employer] [address]

## 2019-01-02 ENCOUNTER — Encounter: Payer: Self-pay | Admitting: Oncology

## 2019-01-07 ENCOUNTER — Other Ambulatory Visit: Payer: Self-pay

## 2019-01-07 ENCOUNTER — Ambulatory Visit: Payer: Managed Care, Other (non HMO) | Admitting: Occupational Therapy

## 2019-01-07 DIAGNOSIS — I972 Postmastectomy lymphedema syndrome: Secondary | ICD-10-CM

## 2019-01-07 DIAGNOSIS — N644 Mastodynia: Secondary | ICD-10-CM

## 2019-01-07 DIAGNOSIS — L905 Scar conditions and fibrosis of skin: Secondary | ICD-10-CM

## 2019-01-07 NOTE — Therapy (Signed)
[facility] [address] [place], [address] Phone: [phone]   Fax:  [phone]  Occupational Therapy Treatment  Patient Details  Name: Tiffany Humphrey MRN: [id number] Date of Birth: 13-Mar-1971 Referring Provider (OT): Dr Janese Banks   Encounter Date: 01/07/2019  OT End of Session - 01/07/19 1349    Visit Number  2    Number of Visits  5    Date for OT Re-Evaluation  02/04/19    OT Start Time  6712    OT Stop Time  1338    OT Time Calculation (min)  40 min    Activity Tolerance  Patient tolerated treatment well    Behavior During Therapy  [facility] for tasks assessed/performed       Past Medical History:  Diagnosis Date  . Anxiety   . Breast cancer ([place]) 06/2018   left breast  . Cancer (HCC)    LEFT BREAST   . Complication of anesthesia   . GERD (gastroesophageal reflux disease)   . Headache    migraines/stress, optical migraines  . History of kidney stones   . Multilevel degenerative disc disease   . Personal history of radiation therapy   . PONV (postoperative nausea and vomiting)   . Vertigo    last episode 10/2016-NO PROBLEMS SINCE SINUS SURGERY IN 2018  . Wears contact lenses     Past Surgical History:  Procedure Laterality Date  . BREAST BIOPSY Right 06/08/2016   CYSTIC PAPILLARY APOCRINE METAPLASIA.   Marland Kitchen BREAST BIOPSY Left 06/06/2018   left breast stereo X clip positive  . BREAST EXCISIONAL BIOPSY Left 06/22/2018   lumpectomy with sn and nl  . BREAST LUMPECTOMY Left 06/22/2018  . FRONTAL SINUS EXPLORATION Bilateral 12/22/2016   Procedure: FRONTAL SINUS EXPLORATION;  Surgeon: Margaretha Sheffield, MD;  Location: [place];  Service: ENT;  Laterality: Bilateral;  . IMAGE GUIDED SINUS SURGERY N/A 12/22/2016   Procedure: IMAGE GUIDED SINUS SURGERY;  Surgeon: Margaretha Sheffield, MD;  Location: [place];  Service: ENT;  Laterality: N/A;  . MAXILLARY ANTROSTOMY Bilateral 12/22/2016    Procedure: MAXILLARY ANTROSTOMY;  Surgeon: Margaretha Sheffield, MD;  Location: [place];  Service: ENT;  Laterality: Bilateral;  . PARTIAL MASTECTOMY WITH NEEDLE LOCALIZATION Left 06/22/2018   Procedure: PARTIAL MASTECTOMY WITH NEEDLE LOCALIZATION;  Surgeon: Herbert Pun, MD;  Location: [facility];  Service: General;  Laterality: Left;  . SENTINEL NODE BIOPSY Left 06/22/2018   Procedure: SENTINEL NODE BIOPSY;  Surgeon: Herbert Pun, MD;  Location: [facility];  Service: General;  Laterality: Left;  . SEPTOPLASTY N/A 12/22/2016   Procedure: SEPTOPLASTY;  Surgeon: Margaretha Sheffield, MD;  Location: [place];  Service: ENT;  Laterality: N/A;  GAVE DISK TO CECE 4-5 KP  . TURBINATE REDUCTION N/A 12/22/2016   Procedure: TURBINATE REDUCTION partial inferior;  Surgeon: Margaretha Sheffield, MD;  Location: [place];  Service: ENT;  Laterality: N/A;    There were no vitals filed for this visit.  Subjective Assessment - 01/07/19 1347    Subjective   My breast are doing go much better. Surgeon ordered me compression bras and the pad you told me to get - I have been wearing - need to get 2nd one of each - but I did not do my massage or bra the last day and my breast again little sore on bottom and hard     Patient Stated Goals  I just want the pain better in  my L breast and that it feels better be more the size than my R one    Currently in Pain?  Yes    Pain Score  2     Pain Location  Breast    Pain Orientation  Left    Pain Descriptors / Indicators  Tender;Tightness;Heaviness    Pain Type  Acute pain;Surgical pain    Pain Onset  1 to 4 weeks ago    Pain Frequency  Constant       Assess - soft tissue fibrosis decrease on bottom of L breast and lateral side - but still 2/10 - and over lumpectomy scar and medial superior  Breast   Massage bottom of breast and medial superior breast  3 min  side<> side  Each  Scar - pull breast up and massage scar - 1-2 min  -focus on proximal  part  And then side  Of breast cont to massage - 2 min top<> bottom     [facility]  Manual Lymph Drainage review again - need mod A   Do manual lymph drainage once each day to help decrease swelling.  This should take you about 30 minutes depending on the size of your limb.    Hug yourself at the base of your neck and do 8 small circles, and 2 fingers behind clavicle 8 x   Do 8 semicircles at Right armpit and left groin  Pump across chest from left to right 8 times  Pump down the left side of trunk from armpit to groin 8 times  Do 8 semicircles at right armpit and left groin 8 times  Repeat nr.1  SLOW and LIGHT with only your palm NOT FINGERTIPS If help at home can replace or do also massage over upper back from right to left , when doing chest from right to left.           Deep breathing - 10 reps AM and PM  To cont with   Jovipak - breastpad - unilateral post mastectomy one to replace the one she had - to small - wearing compression bra - surgeon prescriptd  - [employer] [address] emailed                     OT Education - 01/07/19 1349    Education Details  findings of progress ,lymphedema symptoms -and Homeprogram     Person(s) Educated  Patient    Methods  Explanation;Demonstration;Verbal cues;Handout    Comprehension  Verbalized understanding;Returned demonstration       OT Short Term Goals - 12/31/18 1715      OT SHORT TERM GOAL #1   Title  Pt to be independent in HEP to decrease pain to less than 2 /10 in L breast     Baseline  no knowledge and pain 6/10     Time  2    Period  Weeks    Status  New    Target Date  01/14/19        OT Long Term Goals - 12/31/18 1716      OT LONG TERM GOAL #1   Title  Pt's L breast pain and fibrosis improve for pt to have no increase symtomps during use of L UE , sleeping or wearing clothes     Baseline  pain 6/10 - cannot tolerate pressure , touching ,  massage - or sleeping on L breast - tender over rib cage under  and lateral breast     Time  5    Period  Weeks    Status  New    Target Date  02/04/19      OT LONG TERM GOAL #2   Title  Pt to be independent in homeprogram of MLD , soft tissue mobs ,and wearing of jovipak breast pad to stay pain free and symptom free  in L breast     Baseline  no knowledge     Time  5    Period  Weeks    Status  New    Target Date  02/04/19            Plan - 01/07/19 1350    Clinical Impression Statement  Pt present this date with less pain and tenderness in L breast - wearing compression bra with jovipak breast pad - but it is to small - pt to get the unilateral jovipak  - send email to DME company to get correct one - to cont with fibrotic tech and MLD - pt pain decrease to 2/10 at the worse     OT Occupational Profile and History  Problem Focused Assessment - Including review of records relating to presenting problem    Occupational performance deficits (Please refer to evaluation for details):  ADL's;IADL's;Play;Leisure;Rest and Sleep;Social Participation    Body Structure / Function / Physical Skills  ADL;Scar mobility;Edema;Pain;UE functional use    Clinical Decision Making  Limited treatment options, no task modification necessary    Comorbidities Affecting Occupational Performance:  None    OT Frequency  1x / week    OT Duration  4 weeks    OT Treatment/Interventions  Self-care/ADL training;Therapeutic exercise;Scar mobilization;Manual Therapy;Manual lymph drainage;Patient/family education    Plan  assess progress in her pain , fibrosis and lymphedema signs doing HEP     OT Home Exercise Plan  see pt instructions     Consulted and Agree with Plan of Care  Patient       Patient will benefit from skilled therapeutic intervention in order to improve the following deficits and impairments:  Body Structure / Function / Physical Skills  Visit Diagnosis: Postmastectomy lymphedema  syndrome  Breast pain, left  Scar condition and fibrosis of skin    Problem List Patient Active Problem List   Diagnosis Date Noted  . Breast cancer ([place]) 06/18/2018  . Migraines 08/03/2015    Rosalyn Gess OTR/L,CLT 01/07/2019, 1:52 PM  [facility] [address] [place], [address] Phone: [phone]   Fax:  [phone]  Name: KAIANA MARION MRN: [id number] Date of Birth: [birth year]

## 2019-01-07 NOTE — Patient Instructions (Addendum)
Same HEP same HEP - but

## 2019-01-14 ENCOUNTER — Encounter: Payer: Self-pay | Admitting: Licensed Clinical Social Worker

## 2019-01-14 ENCOUNTER — Other Ambulatory Visit: Payer: Self-pay

## 2019-01-14 ENCOUNTER — Ambulatory Visit: Payer: Managed Care, Other (non HMO) | Admitting: Occupational Therapy

## 2019-01-14 DIAGNOSIS — I972 Postmastectomy lymphedema syndrome: Secondary | ICD-10-CM

## 2019-01-14 DIAGNOSIS — L905 Scar conditions and fibrosis of skin: Secondary | ICD-10-CM

## 2019-01-14 DIAGNOSIS — N644 Mastodynia: Secondary | ICD-10-CM

## 2019-01-14 DIAGNOSIS — Z1379 Encounter for other screening for genetic and chromosomal anomalies: Secondary | ICD-10-CM | POA: Insufficient documentation

## 2019-01-14 NOTE — Therapy (Signed)
[facility] [address] [place], [address] Phone: [phone]   Fax:  [phone]  Occupational Therapy Treatment  Patient Details  Name: Tiffany Humphrey MRN: [id number] Date of Birth: 1970-09-09 Referring Provider (OT): Dr Janese Banks   Encounter Date: 01/14/2019  OT End of Session - 01/14/19 1354    Visit Number  3    Number of Visits  5    Date for OT Re-Evaluation  02/04/19    OT Start Time  1300    OT Stop Time  1325    OT Time Calculation (min)  25 min    Activity Tolerance  Patient tolerated treatment well    Behavior During Therapy  [facility] for tasks assessed/performed       Past Medical History:  Diagnosis Date  . Anxiety   . Breast cancer ([place]) 06/2018   left breast  . Cancer ([facility]CC)    LEFT BREAST   . Complication of anesthesia   . GERD (gastroesophageal reflux disease)   . Headache    migraines/stress, optical migraines  . History of kidney stones   . Multilevel degenerative disc disease   . Personal history of radiation therapy   . PONV (postoperative nausea and vomiting)   . Vertigo    last episode 10/2016-NO PROBLEMS SINCE SINUS SURGERY IN 2018  . Wears contact lenses     Past Surgical History:  Procedure Laterality Date  . BREAST BIOPSY Right 06/08/2016   CYSTIC PAPILLARY APOCRINE METAPLASIA.   Marland Kitchen BREAST BIOPSY Left 06/06/2018   left breast stereo X clip positive  . BREAST EXCISIONAL BIOPSY Left 06/22/2018   lumpectomy with sn and nl  . BREAST LUMPECTOMY Left 06/22/2018  . FRONTAL SINUS EXPLORATION Bilateral 12/22/2016   Procedure: FRONTAL SINUS EXPLORATION;  Surgeon: Margaretha Sheffield, MD;  Location: [place];  Service: ENT;  Laterality: Bilateral;  . IMAGE GUIDED SINUS SURGERY N/A 12/22/2016   Procedure: IMAGE GUIDED SINUS SURGERY;  Surgeon: Margaretha Sheffield, MD;  Location: [place];  Service: ENT;  Laterality: N/A;  . MAXILLARY ANTROSTOMY Bilateral 12/22/2016   Procedure: MAXILLARY ANTROSTOMY;  Surgeon: Margaretha Sheffield, MD;  Location: [place];  Service: ENT;  Laterality: Bilateral;  . PARTIAL MASTECTOMY WITH NEEDLE LOCALIZATION Left 06/22/2018   Procedure: PARTIAL MASTECTOMY WITH NEEDLE LOCALIZATION;  Surgeon: Herbert Pun, MD;  Location: [facility];  Service: General;  Laterality: Left;  . SENTINEL NODE BIOPSY Left 06/22/2018   Procedure: SENTINEL NODE BIOPSY;  Surgeon: Herbert Pun, MD;  Location: [facility];  Service: General;  Laterality: Left;  . SEPTOPLASTY N/A 12/22/2016   Procedure: SEPTOPLASTY;  Surgeon: Margaretha Sheffield, MD;  Location: [place];  Service: ENT;  Laterality: N/A;  GAVE DISK TO CECE 4-5 KP  . TURBINATE REDUCTION N/A 12/22/2016   Procedure: TURBINATE REDUCTION partial inferior;  Surgeon: Margaretha Sheffield, MD;  Location: [place];  Service: ENT;  Laterality: N/A;    There were no vitals filed for this visit.  Subjective Assessment - 01/14/19 1352    Subjective   I am doing so great - did pick up the correct Jovipak breastpad and wore it with this compression bra about 99% of the time since last time - my breast are softer, no pain -did not had even any shooting pain - can I start wearing my pretty bras     Patient Stated Goals  I just want the pain better in my L breast and that it feels  better be more the size than my R one    Currently in Pain?  No/denies         Assess - soft tissue fibrosis in L breast - pt arrive this date with correct Jovipak breast pad - unilateral mastectomy one-  Pt fibrosis made great progress- breast softer and down in size per pt - she did wear her compression bra and jovipak about 99% of time  Pt report she can sleep on it , squeeze or push on breast with no pain - as well as did not had any shooting or stabbing pain  Upon palpation by this OT - she still has scar tissue - and tender on bottom part -and then on lateral superior and inferior L breast - pt to cont  with massage and MLD   Can start weaning for about 4 days to only wearing compression bra with jovipak 50% time during day and still at night time  If not issues - can wear it only night time  And if no issues- stop wearing it  But educated on symptoms -and when to start wearing it    Soft tissue mobs done to scar -and superior and inferior breast - on lateral sides         Assess use of Jovipak -breastpad -unilateral post mastectomy  and ed on how to put on correctly  Will follow up with pt in 2 wks again               OT Education - 01/14/19 1354    Education Details  findings of progress ,lymphedema symptoms/anatomy  -and Homeprogram / and weaning out of compression    Person(s) Educated  Patient    Methods  Explanation;Demonstration;Verbal cues;Handout    Comprehension  Verbalized understanding;Returned demonstration       OT Short Term Goals - 01/14/19 1403      OT SHORT TERM GOAL #1   Title  Pt to be independent in HEP to decrease pain to less than 2 /10 in L breast     Baseline  no knowledge and pain 6/10 at eval  , pt arrive with no reports of pain the past week     Status  Achieved        OT Long Term Goals - 01/14/19 1403      OT LONG TERM GOAL #1   Title  Pt's L breast pain and fibrosis improve for pt to have no increase symtomps during use of L UE , sleeping or wearing clothes     Baseline  at eval pain 6/10 - cannot tolerate pressure , touching , massage - or sleeping on L breast - tender over rib cage under and lateral breast - weaning pt out of compression bra and jovipak gradually the next 2 wks     Time  3    Period  Weeks    Status  On-going    Target Date  02/04/19      OT LONG TERM GOAL #2   Title  Pt to be independent in homeprogram of MLD , soft tissue mobs ,and wearing of jovipak breast pad to stay pain free and symptom free  in L breast     Baseline  no knowledge at eval - pt educated on weaning out of full time compression the  next 2 wks and how to use it if feeling issues     Time  3    Period  Weeks  Status  On-going    Target Date  02/04/19            Plan - 01/14/19 1356    Clinical Impression Statement  Pt showed great progress since 3 wks ago in pain , fibrosis , lymphedema in L breast - pt report no pain since last time -upon assessment from OT she did had some pain at lateral scar , and lateral side of superior and inferior breast - but much softer and size per pt  decrease - pt to wean over the next 2 wks our of her compression -and education done  what to do when she feels symptoms come on again -and possible activities that can cause issues     OT Occupational Profile and History  Problem Focused Assessment - Including review of records relating to presenting problem    Occupational performance deficits (Please refer to evaluation for details):  ADL's;IADL's;Play;Leisure;Rest and Sleep;Social Participation    Body Structure / Function / Physical Skills  ADL;Scar mobility;Edema;Pain;UE functional use    Rehab Potential  Good    Clinical Decision Making  Limited treatment options, no task modification necessary    Comorbidities Affecting Occupational Performance:  None    Modification or Assistance to Complete Evaluation   No modification of tasks or assist necessary to complete eval    OT Frequency  Biweekly    OT Treatment/Interventions  Self-care/ADL training;Therapeutic exercise;Scar mobilization;Manual Therapy;Manual lymph drainage;Patient/family education    Plan  assess progress in her pain , fibrosis and lymphedema signs doing HEP and weaning out of compression    OT Home Exercise Plan  see pt instructions     Consulted and Agree with Plan of Care  Patient       Patient will benefit from skilled therapeutic intervention in order to improve the following deficits and impairments:  Body Structure / Function / Physical Skills  Visit Diagnosis: Postmastectomy lymphedema syndrome  Breast  pain, left  Scar condition and fibrosis of skin    Problem List Patient Active Problem List   Diagnosis Date Noted  . Genetic testing 01/14/2019  . Breast cancer ([place]) 06/18/2018  . Migraines 08/03/2015    Rosalyn Gess OTR/L,CLT 01/14/2019, 2:10 PM  [facility] [address], [place], [address] Phone: [phone]   Fax:  [phone]  Name: REGENIA ERCK MRN: [id number] Date of Birth: [birth year]

## 2019-01-14 NOTE — Telephone Encounter (Signed)
UPDATE: VUS in BMPR1A called [id number] has been reclassified to "Likely Benign." The report date is 01/11/2019.

## 2019-01-14 NOTE — Patient Instructions (Signed)
See note

## 2019-01-28 ENCOUNTER — Ambulatory Visit: Payer: Managed Care, Other (non HMO) | Attending: Oncology | Admitting: Occupational Therapy

## 2019-01-28 ENCOUNTER — Other Ambulatory Visit: Payer: Self-pay

## 2019-01-28 DIAGNOSIS — N644 Mastodynia: Secondary | ICD-10-CM | POA: Diagnosis present

## 2019-01-28 DIAGNOSIS — I972 Postmastectomy lymphedema syndrome: Secondary | ICD-10-CM | POA: Diagnosis present

## 2019-01-28 DIAGNOSIS — L905 Scar conditions and fibrosis of skin: Secondary | ICD-10-CM | POA: Diagnosis present

## 2019-01-28 NOTE — Patient Instructions (Signed)
Pt to  Cont as needed with soft tissue mobs and massage - And if done some lifting or feel like fibrosis increase - she can wear jovipak and compression bra for 24-48 hrs instead of only few nights -  Pay attention if time period of not wearing gets longer

## 2019-01-28 NOTE — Therapy (Signed)
[facility] [address], [place], [address] Phone: [phone]   Fax:  [phone]  Occupational Therapy Treatment  Patient Details  Name: Tiffany Humphrey MRN: [id number] Date of Birth: [birth year] Referring Provider (OT): Dr Janese Banks   Encounter Date: 01/28/2019  OT End of Session - 01/28/19 1330    Visit Number  4    Number of Visits  5    Date for OT Re-Evaluation  02/04/19    OT Start Time  1255    OT Stop Time  1324    OT Time Calculation (min)  29 min    Activity Tolerance  Patient tolerated treatment well    Behavior During Therapy  [facility] for tasks assessed/performed       Past Medical History:  Diagnosis Date  . Anxiety   . Breast cancer ([place]) 06/2018   left breast  . Cancer (HCC)    LEFT BREAST   . Complication of anesthesia   . GERD (gastroesophageal reflux disease)   . Headache    migraines/stress, optical migraines  . History of kidney stones   . Multilevel degenerative disc disease   . Personal history of radiation therapy   . PONV (postoperative nausea and vomiting)   . Vertigo    last episode 10/2016-NO PROBLEMS SINCE SINUS SURGERY IN 2018  . Wears contact lenses     Past Surgical History:  Procedure Laterality Date  . BREAST BIOPSY Right 06/08/2016   CYSTIC PAPILLARY APOCRINE METAPLASIA.   Marland Kitchen BREAST BIOPSY Left 06/06/2018   left breast stereo X clip positive  . BREAST EXCISIONAL BIOPSY Left 06/22/2018   lumpectomy with sn and nl  . BREAST LUMPECTOMY Left 06/22/2018  . FRONTAL SINUS EXPLORATION Bilateral 12/22/2016   Procedure: FRONTAL SINUS EXPLORATION;  Surgeon: Margaretha Sheffield, MD;  Location: [place];  Service: ENT;  Laterality: Bilateral;  . IMAGE GUIDED SINUS SURGERY N/A 12/22/2016   Procedure: IMAGE GUIDED SINUS SURGERY;  Surgeon: Margaretha Sheffield, MD;  Location: [place];  Service: ENT;  Laterality: N/A;  . MAXILLARY ANTROSTOMY Bilateral 12/22/2016   Procedure:  MAXILLARY ANTROSTOMY;  Surgeon: Margaretha Sheffield, MD;  Location: [place];  Service: ENT;  Laterality: Bilateral;  . PARTIAL MASTECTOMY WITH NEEDLE LOCALIZATION Left 06/22/2018   Procedure: PARTIAL MASTECTOMY WITH NEEDLE LOCALIZATION;  Surgeon: Herbert Pun, MD;  Location: [facility];  Service: General;  Laterality: Left;  . SENTINEL NODE BIOPSY Left 06/22/2018   Procedure: SENTINEL NODE BIOPSY;  Surgeon: Herbert Pun, MD;  Location: [facility];  Service: General;  Laterality: Left;  . SEPTOPLASTY N/A 12/22/2016   Procedure: SEPTOPLASTY;  Surgeon: Margaretha Sheffield, MD;  Location: [place];  Service: ENT;  Laterality: N/A;  GAVE DISK TO CECE 4-5 KP  . TURBINATE REDUCTION N/A 12/22/2016   Procedure: TURBINATE REDUCTION partial inferior;  Surgeon: Margaretha Sheffield, MD;  Location: [place];  Service: ENT;  Laterality: N/A;    There were no vitals filed for this visit.  Subjective Assessment - 01/28/19 1325    Subjective   I was able to get to not wearing anything for about 2 days - but then I was doing some lifting at home and could feel some pulling and pain in my breast - did sleep about 3 nights with the jovipak and compression bra - little bit of tenderness at scar - but I can sleep on my breast and with my arms over head     Patient  Stated Goals  I just want the pain better in my L breast and that it feels better be more the size than my R one    Currently in Pain?  No/denies       Pt report she was able to wear and go without compression for about 2 days - but after lifting and moving some heavy objects - she could feel fibrosis got little more on bottom of breast and over scar     Assess - soft tissue fibrosis in L breast - Pt has correct  Jovipak breast pad - unilateral mastectomy one that she wore for 2 nights with compression bra after she felt some of her symptoms coming back   Pt fibrosis  Still made great progress- breast softer and down in size per  pt - report she can sleep on it , squeeze or push on breast with no pain - she  did  had  Some shooting or stabbing pain after lifting heavy objects - but got better after wearing compression for 3 nights  Upon palpation by this OT - she still has scar tissue that is tender - and tender on  Inferior bottom part -and then on  Medial  superior  L breast - pt to cont with massage and MLD as needed    Pt to pay attention how long she can go without compression if she feels it - do soft tissue and wear compression jovipak and bra maybe for 24-48 hrs instead of only night time   Pt again educated on symptoms -and when to start wearing compression and do soft tissue   Soft tissue mobs done to scar  - that is still about 5/10 - and bottom of breast and  Medial superior breast -   Will follow up with pt in 3-4 wks again                  OT Education - 01/28/19 1330    Education Details  findings of progress ,lymphedema symptoms/anatomy  -and Homeprogram / and wearing of  compression    Person(s) Educated  Patient    Methods  Explanation;Demonstration;Verbal cues;Handout    Comprehension  Verbalized understanding;Returned demonstration       OT Short Term Goals - 01/14/19 1403      OT SHORT TERM GOAL #1   Title  Pt to be independent in HEP to decrease pain to less than 2 /10 in L breast     Baseline  no knowledge and pain 6/10 at eval  , pt arrive with no reports of pain the past week     Status  Achieved        OT Long Term Goals - 01/14/19 1403      OT LONG TERM GOAL #1   Title  Pt's L breast pain and fibrosis improve for pt to have no increase symtomps during use of L UE , sleeping or wearing clothes     Baseline  at eval pain 6/10 - cannot tolerate pressure , touching , massage - or sleeping on L breast - tender over rib cage under and lateral breast - weaning pt out of compression bra and jovipak gradually the next 2 wks     Time  3    Period  Weeks    Status   On-going    Target Date  02/04/19      OT LONG TERM GOAL #2   Title  Pt to be independent in  homeprogram of MLD , soft tissue mobs ,and wearing of jovipak breast pad to stay pain free and symptom free  in L breast     Baseline  no knowledge at eval - pt educated on weaning out of full time compression the next 2 wks and how to use it if feeling issues     Time  3    Period  Weeks    Status  On-going    Target Date  02/04/19            Plan - 01/28/19 1331    Clinical Impression Statement  Pt cont to make great progress in pain , fibrosis and lymphedema in L breast - she report less pain - and able to sleep on stomach , and  do all activities with out pain - was able to wean out of compression for 2 days - but then with lifting some heavy  objects - could feel fibrosis got little more below breast , and around scar tissue - pt to cont soft tissuew mobs and if she has flair up with over use - wear compression maybe for 24-48 hrs - will follow up with me in about 3-4 wks     OT Occupational Profile and History  Problem Focused Assessment - Including review of records relating to presenting problem    Occupational performance deficits (Please refer to evaluation for details):  ADL's;IADL's;Play;Leisure;Rest and Sleep;Social Participation    Body Structure / Function / Physical Skills  ADL;Scar mobility;Edema;Pain;UE functional use    Rehab Potential  Good    Clinical Decision Making  Limited treatment options, no task modification necessary    Comorbidities Affecting Occupational Performance:  None    Modification or Assistance to Complete Evaluation   No modification of tasks or assist necessary to complete eval    OT Frequency  Monthly    OT Duration  4 weeks    OT Treatment/Interventions  Self-care/ADL training;Therapeutic exercise;Scar mobilization;Manual Therapy;Manual lymph drainage;Patient/family education    Plan  assess progress in her pain , fibrosis and lymphedema signs doing HEP  and wearing  of compression the last 3-4 wks     OT Home Exercise Plan  see pt instructions     Consulted and Agree with Plan of Care  Patient       Patient will benefit from skilled therapeutic intervention in order to improve the following deficits and impairments:  Body Structure / Function / Physical Skills  Visit Diagnosis: Postmastectomy lymphedema syndrome  Breast pain, left  Scar condition and fibrosis of skin    Problem List Patient Active Problem List   Diagnosis Date Noted  . Genetic testing 01/14/2019  . Breast cancer ([place]) 06/18/2018  . Migraines 08/03/2015    Rosalyn Gess OTR/L,CLT 01/28/2019, 1:34 PM  [facility] [address], [place], [address] Phone: [phone]   Fax:  [phone]  Name: Tiffany Humphrey MRN: [id number] Date of Birth: [birth year]

## 2019-01-29 DIAGNOSIS — I890 Lymphedema, not elsewhere classified: Secondary | ICD-10-CM | POA: Insufficient documentation

## 2019-02-04 ENCOUNTER — Other Ambulatory Visit: Payer: Self-pay | Admitting: *Deleted

## 2019-02-04 ENCOUNTER — Inpatient Hospital Stay: Payer: Managed Care, Other (non HMO) | Attending: Oncology

## 2019-02-04 ENCOUNTER — Other Ambulatory Visit: Payer: Self-pay

## 2019-02-04 ENCOUNTER — Encounter: Payer: Self-pay | Admitting: Oncology

## 2019-02-04 ENCOUNTER — Inpatient Hospital Stay: Payer: Managed Care, Other (non HMO) | Attending: Oncology | Admitting: Oncology

## 2019-02-04 VITALS — BP 132/90 | HR 80 | Temp 97.6°F | Wt 184.8 lb

## 2019-02-04 DIAGNOSIS — C50412 Malignant neoplasm of upper-outer quadrant of left female breast: Secondary | ICD-10-CM | POA: Diagnosis present

## 2019-02-04 DIAGNOSIS — Z87891 Personal history of nicotine dependence: Secondary | ICD-10-CM | POA: Diagnosis not present

## 2019-02-04 DIAGNOSIS — I890 Lymphedema, not elsewhere classified: Secondary | ICD-10-CM | POA: Diagnosis not present

## 2019-02-04 DIAGNOSIS — R5383 Other fatigue: Secondary | ICD-10-CM

## 2019-02-04 DIAGNOSIS — Z170 Estrogen receptor positive status [ER+]: Secondary | ICD-10-CM

## 2019-02-04 DIAGNOSIS — Z79899 Other long term (current) drug therapy: Secondary | ICD-10-CM | POA: Insufficient documentation

## 2019-02-04 DIAGNOSIS — Z79810 Long term (current) use of selective estrogen receptor modulators (SERMs): Secondary | ICD-10-CM

## 2019-02-04 LAB — CBC
HCT: 41 % (ref 36.0–46.0)
Hemoglobin: 13.8 g/dL (ref 12.0–15.0)
MCH: 29.4 pg (ref 26.0–34.0)
MCHC: 33.7 g/dL (ref 30.0–36.0)
MCV: 87.4 fL (ref 80.0–100.0)
Platelets: 283 10*3/uL (ref 150–400)
RBC: 4.69 MIL/uL (ref 3.87–5.11)
RDW: 12.4 % (ref 11.5–15.5)
WBC: 8.4 10*3/uL (ref 4.0–10.5)
nRBC: 0 % (ref 0.0–0.2)

## 2019-02-04 LAB — IRON AND TIBC
Iron: 45 ug/dL (ref 28–170)
Saturation Ratios: 12 % (ref 10.4–31.8)
TIBC: 375 ug/dL (ref 250–450)
UIBC: 330 ug/dL

## 2019-02-04 LAB — TSH: TSH: 2.096 u[IU]/mL (ref 0.350–4.500)

## 2019-02-04 LAB — VITAMIN B12: Vitamin B-12: 126 pg/mL — ABNORMAL LOW (ref 180–914)

## 2019-02-04 LAB — FERRITIN: Ferritin: 63 ng/mL (ref 11–307)

## 2019-02-04 LAB — FOLATE: Folate: 9.3 ng/mL (ref >5.9)

## 2019-02-04 NOTE — Progress Notes (Signed)
Hematology/Oncology Consult note [facility]  [phone] [phone]  Patient Care Team: Juline Patch, MD as PCP - General (Family Medicine)   Name of the patient: Tiffany Humphrey  [id number]  March 24, 1971   Date of visit: 02/04/19  Diagnosis- Pathological prognostic stage I A invasive mammary carcinoma T1a pN0 cM0 ER, PR positive her 2 negative  Chief complaint/ Reason for visit-routine follow-up of breast cancer  Heme/Onc history: patient is a 48 year old female whose mother was recently diagnosed with DCIS. She recently underwent a screening mammogram which showed abnormal calcifications in her left breast. Diagnostic mammogram and ultrasound confirmed 9 x 5 x 5 mm calcifications. 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 There was a single focus worrisome but not diagnostic for microinvasion. Patient has met with surgery and is tentatively scheduled to undergo lumpectomy and sentinel lymph node biopsy next week. Patient states that she has used birth control for the last 25 years. She used to have regular. But about 5 months ago she did not have her menstrual cycles for about 5 months. At that point her birth control was temporarily heldand she did have her menstrual cycle after 5 months. She did restart her birth control at that point.She feels well today and denies any complaints. Her appetite is good and she denies any unintentional weight loss. She is G1P1L1.No other family history of breast cancer other than her mother had DCIS.  Patient had a biopsy on 06/22/2018. Final pathology showed invasive mammary carcinoma 5 mm grade 2 ER greater than 90% positive PR 51 to 90% positive and HER-2/neu negative with negative margins. Sentinel lymph node was negative for malignancy. This was associated with high-grade DCIS.  oncotype score came back at 16. Plan is to proceed with OS plus AI. However patient had  significant headache with the first dose of Lupron to the point that she could not function and headache persisted for almost a month. Plan is to discontinue Lupron after 1 dose and switch her to tamoxifen instead  Patient will be completing adjuvant radiation treatment end of January 2020   Interval history-left breast lymphedema is significantly better after she was seen by physical therapy and started wearing compression bras and breast fat.  She still has some discomfort but she is now able to go without wearing a compression bra for 2 to 3 days unlike before when she was wearing it daily.  She continues to have hot flashes which are bothersome.  Reports feeling fatigued  ECOG PS- 0 Pain scale- 0 Opioid associated constipation- no  Review of systems- Review of Systems  Constitutional: Positive for malaise/fatigue. Negative for chills, fever and weight loss.  HENT: Negative for congestion, ear discharge and nosebleeds.   Eyes: Negative for blurred vision.  Respiratory: Negative for cough, hemoptysis, sputum production, shortness of breath and wheezing.   Cardiovascular: Negative for chest pain, palpitations, orthopnea and claudication.  Gastrointestinal: Negative for abdominal pain, blood in stool, constipation, diarrhea, heartburn, melena, nausea and vomiting.  Genitourinary: Negative for dysuria, flank pain, frequency, hematuria and urgency.  Musculoskeletal: Negative for back pain, joint pain and myalgias.  Skin: Negative for rash.  Neurological: Negative for dizziness, tingling, focal weakness, seizures, weakness and headaches.  Endo/Heme/Allergies: Does not bruise/bleed easily.       Hot flashes  Psychiatric/Behavioral: Negative for depression and suicidal ideas. The patient does not have insomnia.       Allergies  Allergen Reactions  . Quinolones Hives  .  Clindamycin Hives  . Codeine Other (See Comments) and Palpitations    tachycardia     Past Medical History:   Diagnosis Date  . Anxiety   . Breast cancer ([place]) 06/2018   left breast  . Cancer (HCC)    LEFT BREAST   . Complication of anesthesia   . GERD (gastroesophageal reflux disease)   . Headache    migraines/stress, optical migraines  . History of kidney stones   . Multilevel degenerative disc disease   . Personal history of radiation therapy   . PONV (postoperative nausea and vomiting)   . Vertigo    last episode 10/2016-NO PROBLEMS SINCE SINUS SURGERY IN 2018  . Wears contact lenses      Past Surgical History:  Procedure Laterality Date  . BREAST BIOPSY Right 06/08/2016   CYSTIC PAPILLARY APOCRINE METAPLASIA.   Marland Kitchen BREAST BIOPSY Left 06/06/2018   left breast stereo X clip positive  . BREAST EXCISIONAL BIOPSY Left 06/22/2018   lumpectomy with sn and nl  . BREAST LUMPECTOMY Left 06/22/2018  . FRONTAL SINUS EXPLORATION Bilateral 12/22/2016   Procedure: FRONTAL SINUS EXPLORATION;  Surgeon: Margaretha Sheffield, MD;  Location: [place];  Service: ENT;  Laterality: Bilateral;  . IMAGE GUIDED SINUS SURGERY N/A 12/22/2016   Procedure: IMAGE GUIDED SINUS SURGERY;  Surgeon: Margaretha Sheffield, MD;  Location: [place];  Service: ENT;  Laterality: N/A;  . MAXILLARY ANTROSTOMY Bilateral 12/22/2016   Procedure: MAXILLARY ANTROSTOMY;  Surgeon: Margaretha Sheffield, MD;  Location: [place];  Service: ENT;  Laterality: Bilateral;  . PARTIAL MASTECTOMY WITH NEEDLE LOCALIZATION Left 06/22/2018   Procedure: PARTIAL MASTECTOMY WITH NEEDLE LOCALIZATION;  Surgeon: Herbert Pun, MD;  Location: [facility];  Service: General;  Laterality: Left;  . SENTINEL NODE BIOPSY Left 06/22/2018   Procedure: SENTINEL NODE BIOPSY;  Surgeon: Herbert Pun, MD;  Location: [facility];  Service: General;  Laterality: Left;  . SEPTOPLASTY N/A 12/22/2016   Procedure: SEPTOPLASTY;  Surgeon: Margaretha Sheffield, MD;  Location: [place];  Service: ENT;  Laterality: N/A;  GAVE DISK TO CECE 4-5 KP  .  TURBINATE REDUCTION N/A 12/22/2016   Procedure: TURBINATE REDUCTION partial inferior;  Surgeon: Margaretha Sheffield, MD;  Location: [place];  Service: ENT;  Laterality: N/A;    Social History   Socioeconomic History  . Marital status: Married    Spouse name: Not on file  . Number of children: Not on file  . Years of education: Not on file  . Highest education level: Not on file  Occupational History  . Not on file  Social Needs  . Financial resource strain: Not on file  . Food insecurity:    Worry: Not on file    Inability: Not on file  . Transportation needs:    Medical: Not on file    Non-medical: Not on file  Tobacco Use  . Smoking status: Former Smoker    Packs/day: 1.00    Years: 15.00    Pack years: 15.00    Types: Cigarettes    Last attempt to quit: 2013    Years since quitting: 7.4  . Smokeless tobacco: Never Used  Substance and Sexual Activity  . Alcohol use: Yes    Alcohol/week: 0.0 standard drinks    Comment: social (4-6 drinks/month)  . Drug use: No  . Sexual activity: Yes  Lifestyle  . Physical activity:    Days per week: Not on file    Minutes per session: Not on file  .  Stress: Not on file  Relationships  . Social connections:    Talks on phone: Not on file    Gets together: Not on file    Attends religious service: Not on file    Active member of club or organization: Not on file    Attends meetings of clubs or organizations: Not on file    Relationship status: Not on file  . Intimate partner violence:    Fear of current or ex partner: Not on file    Emotionally abused: Not on file    Physically abused: Not on file    Forced sexual activity: Not on file  Other Topics Concern  . Not on file  Social History Narrative  . Not on file    Family History  Problem Relation Age of Onset  . Breast cancer Mother        dx 49s; 2nd primary at 31  . Breast cancer Paternal Grandmother        dx 29s; deceased 5s  . Breast cancer Paternal Aunt         dx 83s; currently 60s  . Breast cancer Cousin        dx 10s; currently late 53s; daughter of a paternal uncle  . Lung cancer Paternal Uncle        smoker; deceased     Current Outpatient Medications:  .  gabapentin (NEURONTIN) 300 MG capsule, Take 1 capsule (300 mg total) by mouth as directed. First 4 days take 1 capsule at night then 2 capsules at night for rest of prescription, Disp: 176 capsule, Rfl: 0 .  hydrochlorothiazide (MICROZIDE) 12.5 MG capsule, Take 12.5 mg by mouth daily. , Disp: , Rfl:  .  ibuprofen (ADVIL,MOTRIN) 200 MG tablet, Take 800 mg by mouth every 6 (six) hours as needed for headache or moderate pain., Disp: , Rfl:  .  levocetirizine (XYZAL) 5 MG tablet, Take 5 mg by mouth every morning. , Disp: , Rfl:  .  meclizine (ANTIVERT) 25 MG tablet, Take 1 tablet (25 mg total) by mouth 3 (three) times daily as needed for dizziness., Disp: 6 tablet, Rfl: 0 .  NON FORMULARY, Place 3 drops under the tongue at bedtime. Allergy Oral Drops made by [facility] , Disp: , Rfl:  .  omeprazole (PRILOSEC OTC) 20 MG tablet, Take 20 mg by mouth every morning. , Disp: , Rfl:  .  predniSONE (DELTASONE) 10 MG tablet, Take 1 tablet (10 mg total) by mouth as directed. Day 1 take 4 tablets, day 2 take 4 tablets, day 3 take 3 tablets, day 4 day 2 tablets, day 5 take 1 tablet. Always take with food and take in am (Patient not taking: Reported on 12/31/2018), Disp: 14 tablet, Rfl: 0 .  prochlorperazine (COMPAZINE) 10 MG tablet, Take 1 tablet (10 mg total) by mouth every 6 (six) hours as needed for nausea or vomiting., Disp: 30 tablet, Rfl: 3 .  tamoxifen (NOLVADEX) 20 MG tablet, TAKE 1 TABLET BY MOUTH EVERY DAY, Disp: 90 tablet, Rfl: 1 .  tamsulosin (FLOMAX) 0.4 MG CAPS capsule, Take 1 capsule (0.4 mg total) by mouth daily. (Patient not taking: Reported on 12/31/2018), Disp: 10 capsule, Rfl: 0 .  traMADol (ULTRAM) 50 MG tablet, Take 50 mg by mouth every 4 (four) hours as needed for moderate pain.,  Disp: , Rfl:  .  zolpidem (AMBIEN) 5 MG tablet, Take 5 mg by mouth at bedtime., Disp: , Rfl:   Physical exam:  Vitals:  02/04/19 1409  BP: 132/90  Pulse: 80  Temp: 97.6 F (36.4 C)  TempSrc: Tympanic  Weight: 184 lb 12.8 oz (83.8 kg)   Physical Exam Constitutional:      General: She is not in acute distress. HENT:     Head: Normocephalic and atraumatic.  Eyes:     Pupils: Pupils are equal, round, and reactive to light.  Neck:     Musculoskeletal: Normal range of motion.  Cardiovascular:     Rate and Rhythm: Normal rate and regular rhythm.     Heart sounds: Normal heart sounds.  Pulmonary:     Effort: Pulmonary effort is normal.     Breath sounds: Normal breath sounds.  Abdominal:     General: Bowel sounds are normal.     Palpations: Abdomen is soft.  Skin:    General: Skin is warm and dry.  Neurological:     Mental Status: She is alert and oriented to person, place, and time.   No palpable bilateral breast masses.  There is significant improvement in the venous engorgement over the left breast.  CMP Latest Ref Rng & Units 10/29/2018  Glucose 70 - 99 mg/dL 103(H)  BUN 6 - 20 mg/dL 15  Creatinine 0.44 - 1.00 mg/dL 1.12(H)  Sodium 135 - 145 mmol/L 140  Potassium 3.5 - 5.1 mmol/L 3.7  Chloride 98 - 111 mmol/L 106  CO2 22 - 32 mmol/L 24  Calcium 8.9 - 10.3 mg/dL 8.9  Total Protein 6.5 - 8.1 g/dL 8.3(H)  Total Bilirubin 0.3 - 1.2 mg/dL 0.7  Alkaline Phos 38 - 126 U/L 65  AST 15 - 41 U/L 20  ALT 0 - 44 U/L 17   CBC Latest Ref Rng & Units 09/10/2018  WBC 4.0 - 10.5 K/uL 9.3  Hemoglobin 12.0 - 15.0 g/dL 14.6  Hematocrit 36.0 - 46.0 % 43.8  Platelets 150 - 400 K/uL 363      Assessment and plan- Patient is a 48 y.o. female who was found to have DCIS on biopsy with a focus of microinvasion. Final pathology shows stage IA pT1a pN 0cM0 invasive mammary carcinoma grade 2 5 mm ER PR positive and HER-2/neu negative status post lumpectomy.Oncotype came back at 16 and  plan was to proceed with OS plus AI and patient received her first dose of Lupron on 07/24/2018.She could not tolerate Lupronand was switched to tamoxifen.    Is a routine follow-up visit of breast cancer  1.  Left breast lymphedema: Significantly improved after compression bra and breast biopsy as well as exercises suggested by physical therapy.  Continue to monitor  2.  Patient will continue to take tamoxifen for 5 years.  She follows up with Dr. Peyton Najjar as well will be seeing her back in 3 months and I will see her in 6 months  3.  Hot flashes: Possibly secondary to tamoxifen.  I have asked her to increase her dose from 600 mg of gabapentin at night to adding an additional dose of 300 mg in the morning or afternoon.  4.  Fatigue: May be secondary to tamoxifen.  I have again encouraged her to continue daily exercises.  I will check CBC ferritin and iron studies, B12 and TSH today  Return to clinic in 6 months.  No labs   Visit Diagnosis 1. Long-term current use of tamoxifen   2. Malignant neoplasm of upper-outer quadrant of left breast in female, estrogen receptor positive ([place])   3. Lymphedema of breast  Dr. Randa Evens, MD, MPH [facility] [phone] 02/04/2019 1:58 PM

## 2019-02-05 ENCOUNTER — Other Ambulatory Visit: Payer: Self-pay | Admitting: *Deleted

## 2019-02-05 ENCOUNTER — Encounter: Payer: Self-pay | Admitting: *Deleted

## 2019-02-05 DIAGNOSIS — E538 Deficiency of other specified B group vitamins: Secondary | ICD-10-CM

## 2019-02-13 ENCOUNTER — Encounter: Payer: Self-pay | Admitting: Oncology

## 2019-02-13 MED ORDER — GABAPENTIN 300 MG PO CAPS: ORAL_CAPSULE | ORAL | 0 refills | Status: DC

## 2019-03-16 ENCOUNTER — Encounter: Payer: Self-pay | Admitting: Oncology

## 2019-03-18 ENCOUNTER — Other Ambulatory Visit: Payer: Self-pay | Admitting: *Deleted

## 2019-03-18 MED ORDER — GABAPENTIN 300 MG PO CAPS: ORAL_CAPSULE | ORAL | 0 refills | Status: DC

## 2019-03-26 ENCOUNTER — Other Ambulatory Visit: Payer: Self-pay | Admitting: General Surgery

## 2019-03-26 DIAGNOSIS — Z853 Personal history of malignant neoplasm of breast: Secondary | ICD-10-CM

## 2019-04-02 ENCOUNTER — Other Ambulatory Visit: Payer: Self-pay

## 2019-04-03 ENCOUNTER — Ambulatory Visit
Admission: RE | Admit: 2019-04-03 | Discharge: 2019-04-03 | Disposition: A | Discharge status: Home / Self Care | Payer: Managed Care, Other (non HMO) | Source: Ambulatory Visit | Attending: Radiation Oncology | Admitting: Radiation Oncology

## 2019-04-03 ENCOUNTER — Other Ambulatory Visit: Payer: Self-pay

## 2019-04-03 ENCOUNTER — Encounter: Payer: Self-pay | Admitting: Radiation Oncology

## 2019-04-03 VITALS — BP 127/79 | HR 85 | Temp 98.6°F | Resp 18

## 2019-04-03 DIAGNOSIS — Z923 Personal history of irradiation: Secondary | ICD-10-CM | POA: Diagnosis not present

## 2019-04-03 DIAGNOSIS — Z79810 Long term (current) use of selective estrogen receptor modulators (SERMs): Secondary | ICD-10-CM | POA: Insufficient documentation

## 2019-04-03 DIAGNOSIS — Z170 Estrogen receptor positive status [ER+]: Secondary | ICD-10-CM | POA: Diagnosis not present

## 2019-04-03 DIAGNOSIS — D0512 Intraductal carcinoma in situ of left breast: Secondary | ICD-10-CM | POA: Diagnosis not present

## 2019-04-04 NOTE — Progress Notes (Signed)
Radiation Oncology Follow up Note  Name: Tiffany Humphrey   Date:   04/03/2019 MRN:  [id number] DOB: [birth year]    This 48 y.o. female presents to the clinic today for 4-month follow-up status post whole breast radiation to her left breast for stage I ER PR positive invasive mammary carcinoma.  REFERRING PROVIDER: Juline Patch, MD  HPI: Patient is a 48 year old female now about 6 months having completed whole breast radiation to her left breast for stage I ER PR positive invasive mammary carcinoma.  Seen today in routine follow-up she is doing well she had some problems with swelling of the left breast called lymphedema really not certain of that although the breast today is fine and she is without complaint.  She specifically denies breast tenderness cough or bone pain..  She is currently on tamoxifen tolerating that well without side effect.  Her last mammograms were were back in April which I have reviewed were reviewed shows some post radiation changes which are typical for radiation.  COMPLICATIONS OF TREATMENT: none  FOLLOW UP COMPLIANCE: keeps appointments   PHYSICAL EXAM:  BP 127/79   Pulse 85   Temp 98.6 F (37 C)   Resp 18  Lungs are clear to A&P cardiac examination essentially unremarkable with regular rate and rhythm. No dominant mass or nodularity is noted in either breast in 2 positions examined. Incision is well-healed. No axillary or supraclavicular adenopathy is appreciated. Cosmetic result is excellent.  Well-developed well-nourished patient in NAD. HEENT reveals PERLA, EOMI, discs not visualized.  Oral cavity is clear. No oral mucosal lesions are identified. Neck is clear without evidence of cervical or supraclavicular adenopathy. Lungs are clear to A&P. Cardiac examination is essentially unremarkable with regular rate and rhythm without murmur rub or thrill. Abdomen is benign with no organomegaly or masses noted. Motor sensory and DTR levels are equal and symmetric in the  upper and lower extremities. Cranial nerves II through XII are grossly intact. Proprioception is intact. No peripheral adenopathy or edema is identified. No motor or sensory levels are noted. Crude visual fields are within normal range.  RADIOLOGY RESULTS: Mammograms reviewed compatible with above-stated findings  PLAN: Present time patient is doing well with no evidence of disease 6 months out from whole breast radiation.  I am pleased with her overall progress.  I have asked to see her back in 6 months and then will start once a year follow-up visits.  She continues on tamoxifen without side effect.  Patient knows to call with any concerns.  I would like to take this opportunity to thank you for allowing me to participate in the care of your patient.Noreene Filbert, MD

## 2019-04-29 NOTE — H&P (Signed)
Tiffany Humphrey is a 48 y.o. female here for Discuss surgery . Pt recently diagnosed with breast cancer ER+/PR+  On tamoxifen and s/p lumpectomy and XRT  She is interested in contraception ie BTL  Past Medical History:  has a past medical history of B12 deficiency, BRCA negative, Cancer (CMS-HCC) (10-06/2018), Cervical dysplasia, and Migraines.  Past Surgical History:  has a past surgical history that includes Breast excisional biopsy; Functional endoscopic sinus surgery; other surgery; Mastectomy, partial (Left, 06/22/2018); and Breast surgery (Left, 05/2018). Family History: family history includes Breast cancer in her mother and paternal grandmother; Heart disease in her father; High blood pressure (Hypertension) in her mother. Social History:  reports that she has quit smoking. She smoked 0.00 packs per day for 0.00 years. She has never used smokeless tobacco. She reports current alcohol use. She reports that she does not use drugs. OB/GYN History:          OB History    Gravida  1   Para  1   Term  1   Preterm      AB      Living  1     SAB      TAB      Ectopic      Molar      Multiple      Live Births  1          Allergies: is allergic to codeine and quinolones. Medications:  Current Outpatient Medications:  .  cyanocobalamin (VITAMIN B-12) 1,000 mcg/mL injection, , Disp: , Rfl:  .  gabapentin (NEURONTIN) 300 MG capsule, TAKE 1 CAPSULE (300 MG) IN THE MORNING AND 2 CAPSULES (600 MG) AT NIGHT, Disp: , Rfl:  .  hydroCHLOROthiazide (MICROZIDE) 12.5 mg capsule, Take 1 capsule (12.5 mg total) by mouth once daily, Disp: 90 capsule, Rfl: 3 .  levocetirizine (XYZAL) 5 MG tablet, Take 5 mg by mouth every evening, Disp: , Rfl:  .  omeprazole (PRILOSEC) 20 MG DR capsule, Take 20 mg by mouth once daily, Disp: , Rfl:  .  tamoxifen (NOLVADEX) 20 MG tablet, Take 20 mg by mouth once daily, Disp: , Rfl:  .  traMADol (ULTRAM) 50 mg tablet, Take 1 tablet (50 mg total) by  mouth every 6 (six) hours as needed for Pain, Disp: 90 tablet, Rfl: 1 .  miscellaneous medical supply Misc, Unilateral Post Mastectomy Pad. Apply on the left breast lumpectomy area as directed., Disp: 2 each, Rfl: 0 .  miscellaneous medical supply Misc, Compression Bra for breast lymphedema., Disp: 2 each, Rfl: 0 .  naproxen sodium (ALEVE, ANAPROX) 220 MG tablet, Take 1 tablet (220 mg total) by mouth 2 (two) times daily with meals., Disp: 90 tablet, Rfl: 1 .  norethindrone-ethinyl estradiol (ALYACEN 1/35, 28,) 1-35 mg-mcg tablet, Take 1 tablet by mouth once daily (Patient not taking: Reported on 04/10/2019  ), Disp: 28 tablet, Rfl: 11 .  zolpidem (AMBIEN) 5 MG tablet, Take 1 tablet (5 mg total) by mouth nightly as needed, Disp: 20 tablet, Rfl: 1  Review of Systems: General:                      No fatigue or weight loss Eyes:                           No vision changes Ears:  No hearing difficulty Respiratory:                No cough or shortness of breath Pulmonary:                  No asthma or shortness of breath Cardiovascular:           No chest pain, palpitations, dyspnea on exertion Gastrointestinal:          No abdominal bloating, chronic diarrhea, constipations, masses, pain or hematochezia Genitourinary:             No hematuria, dysuria, abnormal vaginal discharge, pelvic pain, Menometrorrhagia Lymphatic:                   No swollen lymph nodes Musculoskeletal:         No muscle weakness Neurologic:                  No extremity weakness, syncope, seizure disorder Psychiatric:                  No history of depression, delusions or suicidal/homicidal ideation    Exam:      Vitals:   04/10/19 1002  BP: 111/83  Pulse: 91    Body mass index is 30.29 kg/m.  WDWN white/  female in NAD   Lungs: CTA  CV : RRR without murmur    Neck:  no thyromegaly Abdomen: soft , no mass, normal active bowel sounds,  non-tender, no rebound  tenderness   Impression:   The primary encounter diagnosis was Encounter for sterilization. A diagnosis of Vasomotor flushing was also pertinent to this visit.    Plan:  Spoke to her about L/S BTL  20 minutes in direct discussion of contraception options as well as alternative options for hot flashes ie clonidine , effexor . She is taking Gabapentin to try to control  currently Benefits and risks to surgery:  The proposed benefit of the surgery has been discussed with the patient. The possible risks include, but are not limited to: organ injury to the bowel , bladder, ureters, and major blood vessels and nerves. There is a possibility of additional surgeries resulting from these injuries. There is also the risk of blood transfusion and the need to receive blood products during or after the procedure which may rarely lead to HIV or Hepatitis C infection. There is a risk of developing a deep venous thrombosis or a pulmonary embolism . There is the possibility of wound infection and also anesthetic complications, even the rare possibility of death. The patient understands these risks and wishes to proceed. All questions have been answered and the consent has been signed.        Caroline Sauger, MD

## 2019-05-03 ENCOUNTER — Encounter
Admission: RE | Admit: 2019-05-03 | Discharge: 2019-05-03 | Disposition: A | Discharge status: Home / Self Care | Payer: Managed Care, Other (non HMO) | Source: Ambulatory Visit | Attending: Obstetrics and Gynecology | Admitting: Obstetrics and Gynecology

## 2019-05-03 ENCOUNTER — Other Ambulatory Visit: Payer: Self-pay

## 2019-05-03 HISTORY — DX: Essential (primary) hypertension: I10

## 2019-05-03 NOTE — Patient Instructions (Addendum)
Your procedure is scheduled on: 05/10/2019 Fri Report to Same Day Surgery 2nd floor medical mall [facility] Entrance-take elevator on left to 2nd floor.  Check in with surgery information desk.) To find out your arrival time please call [phone] between 1PM - 3PM on 05/09/2019 Thurs  Remember: Instructions that are not followed completely may result in serious medical risk, up to and including death, or upon the discretion of your surgeon and anesthesiologist your surgery may need to be rescheduled.    _x___ 1. Do not eat food after midnight the night before your procedure. You may drink clear liquids up to 2 hours before you are scheduled to arrive at the hospital for your procedure.  Do not drink clear liquids within 2 hours of your scheduled arrival to the hospital.  Clear liquids include  --Water or Apple juice without pulp  --Clear carbohydrate beverage such as ClearFast or Gatorade  --Black Coffee or Clear Tea (No milk, no creamers, do not add anything to                  the coffee or Tea Type 1 and type 2 diabetics should only drink water.   _____Ensure clear carbohydrate drink on the way to the hospital for bariatric patients  __x__Ensure clear carbohydrate drink 3 hours before surgery. 2 hours before coming in for surgery.   No gum chewing or hard candies.     __x__ 2. No Alcohol for 24 hours before or after surgery.   __x__3. No Smoking or e-cigarettes for 24 prior to surgery.  Do not use any chewable tobacco products for at least 6 hour prior to surgery   ____  4. Bring all medications with you on the day of surgery if instructed.    __x__ 5. Notify your doctor if there is any change in your medical condition     (cold, fever, infections).    x___6. On the morning of surgery brush your teeth with toothpaste and water.  You may rinse your mouth with mouth wash if you wish.  Do not swallow any toothpaste or mouthwash.   Do not wear jewelry, make-up, hairpins, clips  or nail polish.  Do not wear lotions, powders, or perfumes. You may wear deodorant.  Do not shave 48 hours prior to surgery. Men may shave face and neck.  Do not bring valuables to the hospital.    [facility] is not responsible for any belongings or valuables.               Contacts, dentures or bridgework may not be worn into surgery.  Leave your suitcase in the car. After surgery it may be brought to your room.  For patients admitted to the hospital, discharge time is determined by your                       treatment team.  _  Patients discharged the day of surgery will not be allowed to drive home.  You will need someone to drive you home and stay with you the night of your procedure.    Please read over the following fact sheets that you were given:   [facility] Preparing for Surgery and or MRSA Information   _x___ Take anti-hypertensive listed below, cardiac, seizure, asthma,     anti-reflux and psychiatric medicines. These include:  1. gabapentin (NEURONTIN  2.levocetirizine (XYZAL) 5 MG tablet  3.omeprazole (PRILOSEC OTC) 20 MG tablet  4.tamoxifen (NOLVADEX) 20  MG tablet  5.  6.  ____Fleets enema or Magnesium Citrate as directed.   _x___ Use CHG Soap or sage wipes as directed on instruction sheet   ____ Use inhalers on the day of surgery and bring to hospital day of surgery  ____ Stop Metformin and Janumet 2 days prior to surgery.    ____ Take 1/2 of usual insulin dose the night before surgery and none on the morning     surgery.   _x___ Follow recommendations from Cardiologist, Pulmonologist or PCP regarding          stopping Aspirin, Coumadin, Plavix ,Eliquis, Effient, or Pradaxa, and Pletal.  X____Stop Anti-inflammatories such as Advil, Aleve, Ibuprofen, Motrin, Naproxen, Naprosyn, Goodies powders or aspirin products. OK to take Tylenol and                          Celebrex.   _x___ Stop supplements until after surgery.  But may continue Vitamin D, Vitamin B,        and multivitamin.   ____ Bring C-Pap to the hospital.

## 2019-05-03 NOTE — Pre-Procedure Instructions (Signed)
Incentive spirometry and carbohydrate drink given along with instructions.

## 2019-05-07 ENCOUNTER — Encounter
Admission: RE | Admit: 2019-05-07 | Discharge: 2019-05-07 | Disposition: A | Discharge status: Home / Self Care | Payer: Managed Care, Other (non HMO) | Source: Ambulatory Visit | Attending: Obstetrics and Gynecology | Admitting: Obstetrics and Gynecology

## 2019-05-07 ENCOUNTER — Inpatient Hospital Stay: Payer: Managed Care, Other (non HMO)

## 2019-05-07 ENCOUNTER — Other Ambulatory Visit
Admission: RE | Admit: 2019-05-07 | Discharge: 2019-05-07 | Disposition: A | Discharge status: Home / Self Care | Payer: Managed Care, Other (non HMO) | Source: Ambulatory Visit | Attending: Oncology | Admitting: Oncology

## 2019-05-07 ENCOUNTER — Other Ambulatory Visit: Payer: Self-pay

## 2019-05-07 ENCOUNTER — Other Ambulatory Visit: Payer: Self-pay | Admitting: *Deleted

## 2019-05-07 ENCOUNTER — Other Ambulatory Visit: Payer: Managed Care, Other (non HMO)

## 2019-05-07 DIAGNOSIS — Z302 Encounter for sterilization: Secondary | ICD-10-CM | POA: Diagnosis present

## 2019-05-07 DIAGNOSIS — Z803 Family history of malignant neoplasm of breast: Secondary | ICD-10-CM | POA: Diagnosis not present

## 2019-05-07 DIAGNOSIS — Z79899 Other long term (current) drug therapy: Secondary | ICD-10-CM | POA: Diagnosis not present

## 2019-05-07 DIAGNOSIS — Z20828 Contact with and (suspected) exposure to other viral communicable diseases: Secondary | ICD-10-CM | POA: Insufficient documentation

## 2019-05-07 DIAGNOSIS — E538 Deficiency of other specified B group vitamins: Secondary | ICD-10-CM | POA: Insufficient documentation

## 2019-05-07 DIAGNOSIS — N83201 Unspecified ovarian cyst, right side: Secondary | ICD-10-CM | POA: Diagnosis not present

## 2019-05-07 DIAGNOSIS — Z9012 Acquired absence of left breast and nipple: Secondary | ICD-10-CM | POA: Diagnosis not present

## 2019-05-07 DIAGNOSIS — Z79810 Long term (current) use of selective estrogen receptor modulators (SERMs): Secondary | ICD-10-CM | POA: Diagnosis not present

## 2019-05-07 DIAGNOSIS — I10 Essential (primary) hypertension: Secondary | ICD-10-CM | POA: Insufficient documentation

## 2019-05-07 DIAGNOSIS — Z01818 Encounter for other preprocedural examination: Secondary | ICD-10-CM | POA: Insufficient documentation

## 2019-05-07 DIAGNOSIS — Z87891 Personal history of nicotine dependence: Secondary | ICD-10-CM | POA: Diagnosis not present

## 2019-05-07 DIAGNOSIS — Z170 Estrogen receptor positive status [ER+]: Secondary | ICD-10-CM | POA: Diagnosis not present

## 2019-05-07 DIAGNOSIS — Z885 Allergy status to narcotic agent status: Secondary | ICD-10-CM | POA: Diagnosis not present

## 2019-05-07 DIAGNOSIS — C50919 Malignant neoplasm of unspecified site of unspecified female breast: Secondary | ICD-10-CM | POA: Diagnosis not present

## 2019-05-07 LAB — CBC
HCT: 44.5 % (ref 36.0–46.0)
Hemoglobin: 14.8 g/dL (ref 12.0–15.0)
MCH: 29.2 pg (ref 26.0–34.0)
MCHC: 33.3 g/dL (ref 30.0–36.0)
MCV: 87.8 fL (ref 80.0–100.0)
Platelets: 301 10*3/uL (ref 150–400)
RBC: 5.07 MIL/uL (ref 3.87–5.11)
RDW: 13.1 % (ref 11.5–15.5)
WBC: 6.3 10*3/uL (ref 4.0–10.5)
nRBC: 0 % (ref 0.0–0.2)

## 2019-05-07 LAB — TYPE AND SCREEN
ABO/RH(D): O POS
Antibody Screen: NEGATIVE

## 2019-05-07 LAB — BASIC METABOLIC PANEL
Anion gap: 11 (ref 5–15)
BUN: 11 mg/dL (ref 6–20)
CO2: 25 mmol/L (ref 22–32)
Calcium: 9.2 mg/dL (ref 8.9–10.3)
Chloride: 105 mmol/L (ref 98–111)
Creatinine, Ser: 0.78 mg/dL (ref 0.44–1.00)
GFR calc Af Amer: >60 mL/min (ref >60)
GFR calc non Af Amer: >60 mL/min (ref >60)
Glucose, Bld: 87 mg/dL (ref 70–99)
Potassium: 3.4 mmol/L — ABNORMAL LOW (ref 3.5–5.1)
Sodium: 141 mmol/L (ref 135–145)

## 2019-05-07 LAB — SARS CORONAVIRUS 2 (TAT 6-24 HRS): SARS Coronavirus 2: NEGATIVE

## 2019-05-07 LAB — VITAMIN B12: Vitamin B-12: 449 pg/mL (ref 180–914)

## 2019-05-08 ENCOUNTER — Encounter: Payer: Self-pay | Admitting: *Deleted

## 2019-05-10 ENCOUNTER — Ambulatory Visit
Admission: RE | Admit: 2019-05-10 | Discharge: 2019-05-10 | Disposition: A | Discharge status: Home / Self Care | Payer: Managed Care, Other (non HMO) | Attending: Obstetrics and Gynecology | Admitting: Obstetrics and Gynecology

## 2019-05-10 ENCOUNTER — Other Ambulatory Visit: Payer: Self-pay

## 2019-05-10 ENCOUNTER — Encounter
Admission: RE | Disposition: A | Discharge status: Home / Self Care | Payer: Self-pay | Source: Home / Self Care | Attending: Obstetrics and Gynecology

## 2019-05-10 ENCOUNTER — Ambulatory Visit: Payer: Managed Care, Other (non HMO) | Admitting: Anesthesiology

## 2019-05-10 ENCOUNTER — Encounter: Payer: Self-pay | Admitting: *Deleted

## 2019-05-10 DIAGNOSIS — Z302 Encounter for sterilization: Secondary | ICD-10-CM | POA: Diagnosis not present

## 2019-05-10 DIAGNOSIS — Z87891 Personal history of nicotine dependence: Secondary | ICD-10-CM | POA: Insufficient documentation

## 2019-05-10 DIAGNOSIS — Z79810 Long term (current) use of selective estrogen receptor modulators (SERMs): Secondary | ICD-10-CM | POA: Insufficient documentation

## 2019-05-10 DIAGNOSIS — Z170 Estrogen receptor positive status [ER+]: Secondary | ICD-10-CM | POA: Insufficient documentation

## 2019-05-10 DIAGNOSIS — C50919 Malignant neoplasm of unspecified site of unspecified female breast: Secondary | ICD-10-CM | POA: Insufficient documentation

## 2019-05-10 DIAGNOSIS — Z9012 Acquired absence of left breast and nipple: Secondary | ICD-10-CM | POA: Insufficient documentation

## 2019-05-10 DIAGNOSIS — Z803 Family history of malignant neoplasm of breast: Secondary | ICD-10-CM | POA: Insufficient documentation

## 2019-05-10 DIAGNOSIS — N83201 Unspecified ovarian cyst, right side: Secondary | ICD-10-CM | POA: Insufficient documentation

## 2019-05-10 DIAGNOSIS — Z79899 Other long term (current) drug therapy: Secondary | ICD-10-CM | POA: Insufficient documentation

## 2019-05-10 DIAGNOSIS — Z885 Allergy status to narcotic agent status: Secondary | ICD-10-CM | POA: Insufficient documentation

## 2019-05-10 DIAGNOSIS — Z20828 Contact with and (suspected) exposure to other viral communicable diseases: Secondary | ICD-10-CM | POA: Insufficient documentation

## 2019-05-10 HISTORY — PX: LAPAROSCOPIC TUBAL LIGATION: SHX1937

## 2019-05-10 HISTORY — PX: CYSTOSTOMY: SHX155

## 2019-05-10 LAB — ABO/RH: ABO/RH(D): O POS

## 2019-05-10 LAB — POCT PREGNANCY, URINE: Preg Test, Ur: NEGATIVE

## 2019-05-10 SURGERY — LIGATION, FALLOPIAN TUBE, LAPAROSCOPIC
Anesthesia: General | Laterality: Right

## 2019-05-10 MED ORDER — MIDAZOLAM HCL 2 MG/2ML IJ SOLN
INTRAMUSCULAR | Status: AC
  Filled 2019-05-10: qty 2

## 2019-05-10 MED ORDER — ROCURONIUM BROMIDE 100 MG/10ML IV SOLN
INTRAVENOUS | Status: DC | PRN
  Administered 2019-05-10: 35 mg via INTRAVENOUS

## 2019-05-10 MED ORDER — FENTANYL CITRATE (PF) 100 MCG/2ML IJ SOLN
25.0000 ug | INTRAMUSCULAR | Status: AC | PRN
  Administered 2019-05-10 (×6): 25 ug via INTRAVENOUS

## 2019-05-10 MED ORDER — GABAPENTIN 300 MG PO CAPS: 300.0000 mg | ORAL_CAPSULE | ORAL | Status: DC

## 2019-05-10 MED ORDER — ONDANSETRON HCL 4 MG/2ML IJ SOLN
INTRAMUSCULAR | Status: DC | PRN
  Administered 2019-05-10: 4 mg via INTRAVENOUS

## 2019-05-10 MED ORDER — DEXAMETHASONE SODIUM PHOSPHATE 10 MG/ML IJ SOLN
INTRAMUSCULAR | Status: DC | PRN
  Administered 2019-05-10: 10 mg via INTRAVENOUS

## 2019-05-10 MED ORDER — OXYCODONE HCL 5 MG PO TABS
ORAL_TABLET | ORAL | Status: AC
  Administered 2019-05-10: 12:00:00 5 mg via ORAL
  Filled 2019-05-10: qty 1

## 2019-05-10 MED ORDER — ACETAMINOPHEN 500 MG PO TABS
ORAL_TABLET | ORAL | Status: AC
  Administered 2019-05-10: 09:00:00 1000 mg via ORAL
  Filled 2019-05-10: qty 2

## 2019-05-10 MED ORDER — FENTANYL CITRATE (PF) 100 MCG/2ML IJ SOLN
INTRAMUSCULAR | Status: AC
  Filled 2019-05-10: qty 2

## 2019-05-10 MED ORDER — PROMETHAZINE HCL 25 MG/ML IJ SOLN: 6.2500 mg | INTRAMUSCULAR | Status: DC | PRN

## 2019-05-10 MED ORDER — ACETAMINOPHEN 500 MG PO TABS
1000.0000 mg | ORAL_TABLET | ORAL | Status: AC
  Administered 2019-05-10: 09:00:00 1000 mg via ORAL

## 2019-05-10 MED ORDER — SCOPOLAMINE 1 MG/3DAYS TD PT72
MEDICATED_PATCH | TRANSDERMAL | Status: AC
  Administered 2019-05-10: 1.5 mg via TRANSDERMAL
  Filled 2019-05-10: qty 1

## 2019-05-10 MED ORDER — PROPOFOL 10 MG/ML IV BOLUS
INTRAVENOUS | Status: AC
  Filled 2019-05-10: qty 20

## 2019-05-10 MED ORDER — OXYCODONE HCL 5 MG PO TABS
ORAL_TABLET | ORAL | Status: AC
  Filled 2019-05-10: qty 1

## 2019-05-10 MED ORDER — FENTANYL CITRATE (PF) 100 MCG/2ML IJ SOLN
25.0000 ug | INTRAMUSCULAR | Status: AC | PRN
  Administered 2019-05-10 (×2): 25 ug via INTRAVENOUS

## 2019-05-10 MED ORDER — OXYCODONE HCL 5 MG PO TABS
5.0000 mg | ORAL_TABLET | Freq: Once | ORAL | Status: AC | PRN
  Administered 2019-05-10: 5 mg via ORAL

## 2019-05-10 MED ORDER — LIDOCAINE HCL (CARDIAC) PF 100 MG/5ML IV SOSY
PREFILLED_SYRINGE | INTRAVENOUS | Status: DC | PRN
  Administered 2019-05-10: 40 mg via INTRAVENOUS

## 2019-05-10 MED ORDER — SUGAMMADEX SODIUM 200 MG/2ML IV SOLN
INTRAVENOUS | Status: DC | PRN
  Administered 2019-05-10: 164 mg via INTRAVENOUS

## 2019-05-10 MED ORDER — OXYCODONE HCL 5 MG PO TABS
5.0000 mg | ORAL_TABLET | Freq: Once | ORAL | Status: AC
  Administered 2019-05-10: 12:00:00 5 mg via ORAL

## 2019-05-10 MED ORDER — KETOROLAC TROMETHAMINE 30 MG/ML IJ SOLN
INTRAMUSCULAR | Status: DC | PRN
  Administered 2019-05-10: 30 mg via INTRAVENOUS

## 2019-05-10 MED ORDER — LACTATED RINGERS IV SOLN
INTRAVENOUS | Status: DC
  Administered 2019-05-10 (×2): via INTRAVENOUS

## 2019-05-10 MED ORDER — PHENYLEPHRINE HCL (PRESSORS) 10 MG/ML IV SOLN
INTRAVENOUS | Status: DC | PRN
  Administered 2019-05-10 (×4): 100 ug via INTRAVENOUS

## 2019-05-10 MED ORDER — BUPIVACAINE HCL 0.5 % IJ SOLN
INTRAMUSCULAR | Status: DC | PRN
  Administered 2019-05-10: 9 mL

## 2019-05-10 MED ORDER — OXYCODONE HCL 5 MG/5ML PO SOLN: 5.0000 mg | Freq: Once | ORAL | Status: AC | PRN

## 2019-05-10 MED ORDER — MIDAZOLAM HCL 2 MG/2ML IJ SOLN
INTRAMUSCULAR | Status: DC | PRN
  Administered 2019-05-10: 2 mg via INTRAVENOUS

## 2019-05-10 MED ORDER — PROPOFOL 10 MG/ML IV BOLUS
INTRAVENOUS | Status: DC | PRN
  Administered 2019-05-10: 140 mg via INTRAVENOUS

## 2019-05-10 MED ORDER — DEXMEDETOMIDINE HCL 200 MCG/2ML IV SOLN
INTRAVENOUS | Status: DC | PRN
  Administered 2019-05-10: 4 ug via INTRAVENOUS
  Administered 2019-05-10: 12 ug via INTRAVENOUS

## 2019-05-10 MED ORDER — LACTATED RINGERS IV SOLN: INTRAVENOUS | Status: DC

## 2019-05-10 MED ORDER — EPHEDRINE SULFATE 50 MG/ML IJ SOLN
INTRAMUSCULAR | Status: DC | PRN
  Administered 2019-05-10: 10 mg via INTRAVENOUS

## 2019-05-10 MED ORDER — GABAPENTIN 300 MG PO CAPS
ORAL_CAPSULE | ORAL | Status: AC
  Filled 2019-05-10: qty 1

## 2019-05-10 MED ORDER — FENTANYL CITRATE (PF) 100 MCG/2ML IJ SOLN
INTRAMUSCULAR | Status: AC
  Administered 2019-05-10: 10:00:00 25 ug via INTRAVENOUS
  Filled 2019-05-10: qty 2

## 2019-05-10 MED ORDER — FENTANYL CITRATE (PF) 100 MCG/2ML IJ SOLN
INTRAMUSCULAR | Status: DC | PRN
  Administered 2019-05-10: 25 ug via INTRAVENOUS
  Administered 2019-05-10: 50 ug via INTRAVENOUS
  Administered 2019-05-10 (×2): 25 ug via INTRAVENOUS
  Administered 2019-05-10: 50 ug via INTRAVENOUS
  Administered 2019-05-10: 25 ug via INTRAVENOUS

## 2019-05-10 MED ORDER — FENTANYL CITRATE (PF) 100 MCG/2ML IJ SOLN
INTRAMUSCULAR | Status: AC
  Administered 2019-05-10: 25 ug via INTRAVENOUS
  Filled 2019-05-10: qty 2

## 2019-05-10 MED ORDER — SEVOFLURANE IN SOLN
RESPIRATORY_TRACT | Status: AC
  Filled 2019-05-10: qty 250

## 2019-05-10 MED ORDER — SCOPOLAMINE 1 MG/3DAYS TD PT72
1.0000 | MEDICATED_PATCH | TRANSDERMAL | Status: DC
  Administered 2019-05-10: 08:00:00 1.5 mg via TRANSDERMAL

## 2019-05-10 SURGICAL SUPPLY — 37 items
BLADE SURG SZ11 CARB STEEL (BLADE) ×4 IMPLANT
CATH ROBINSON RED A/P 16FR (CATHETERS) ×4 IMPLANT
CHLORAPREP W/TINT 26 (MISCELLANEOUS) ×4 IMPLANT
CLOSURE WOUND 1/4X4 (GAUZE/BANDAGES/DRESSINGS) ×1
COVER WAND RF STERILE (DRAPES) IMPLANT
DRSG TEGADERM 2-3/8X2-3/4 SM (GAUZE/BANDAGES/DRESSINGS) ×8 IMPLANT
GLOVE BIO SURGEON STRL SZ8 (GLOVE) ×8 IMPLANT
GOWN STRL REUS W/ TWL LRG LVL3 (GOWN DISPOSABLE) ×2 IMPLANT
GOWN STRL REUS W/ TWL XL LVL3 (GOWN DISPOSABLE) ×2 IMPLANT
GOWN STRL REUS W/TWL LRG LVL3 (GOWN DISPOSABLE) ×2
GOWN STRL REUS W/TWL XL LVL3 (GOWN DISPOSABLE) ×2
GRASPER SUT TROCAR 14GX15 (MISCELLANEOUS) IMPLANT
KIT DISPOSABLE FALLOPE RING (Ring) ×4 IMPLANT
KIT PINK PAD W/HEAD ARE REST (MISCELLANEOUS) ×4
KIT PINK PAD W/HEAD ARM REST (MISCELLANEOUS) ×2 IMPLANT
KIT TURNOVER CYSTO (KITS) ×4 IMPLANT
LABEL OR SOLS (LABEL) ×4 IMPLANT
NDL HPO THNWL 1X22GA REG BVL (NEEDLE) ×2 IMPLANT
NEEDLE SAFETY 22GX1 (NEEDLE) ×2
NS IRRIG 500ML POUR BTL (IV SOLUTION) ×4 IMPLANT
PACK GYN LAPAROSCOPIC (MISCELLANEOUS) ×4 IMPLANT
PAD OB MATERNITY 4.3X12.25 (PERSONAL CARE ITEMS) ×4 IMPLANT
PAD PREP 24X41 OB/GYN DISP (PERSONAL CARE ITEMS) ×4 IMPLANT
SET TUBE SMOKE EVAC HIGH FLOW (TUBING) ×4 IMPLANT
SHEARS HARMONIC ACE PLUS 36CM (ENDOMECHANICALS) IMPLANT
SLEEVE ENDOPATH XCEL 5M (ENDOMECHANICALS) ×4 IMPLANT
SPONGE GAUZE 2X2 8PLY STER LF (GAUZE/BANDAGES/DRESSINGS) ×1
SPONGE GAUZE 2X2 8PLY STRL LF (GAUZE/BANDAGES/DRESSINGS) ×3 IMPLANT
STRIP CLOSURE SKIN 1/4X4 (GAUZE/BANDAGES/DRESSINGS) ×3 IMPLANT
SUT VIC AB 0 CT1 36 (SUTURE) ×4 IMPLANT
SUT VIC AB 2-0 UR6 27 (SUTURE) ×4 IMPLANT
SUT VIC AB 4-0 SH 27 (SUTURE) ×2
SUT VIC AB 4-0 SH 27XANBCTRL (SUTURE) ×2 IMPLANT
SWABSTK COMLB BENZOIN TINCTURE (MISCELLANEOUS) ×4 IMPLANT
SYR 30ML LL (SYRINGE) ×4 IMPLANT
TROCAR ENDO BLADELESS 11MM (ENDOMECHANICALS) IMPLANT
TROCAR XCEL NON-BLD 5MMX100MML (ENDOMECHANICALS) ×4 IMPLANT

## 2019-05-10 NOTE — Anesthesia Post-op Follow-up Note (Signed)
Anesthesia QCDR form completed.        

## 2019-05-10 NOTE — Progress Notes (Signed)
Pt ready for l/s BTL  HCG neg ,covid neg  All questions answered . Proceed

## 2019-05-10 NOTE — Discharge Instructions (Signed)
rtc for increasing ABdominal pain , fever , N/V    Tubal Ligation Reversal, Care After This sheet gives you information about how to care for yourself after your procedure. Your health care provider may also give you more specific instructions. If you have problems or questions, contact your health care provider. What can I expect after the procedure? After the procedure, it is common to have:  Mild abdominal discomfort. This can include: ? Mild cramping. ? Gas pains or feeling bloated. ? Pain or soreness at the incision areas.  Discomfort in the shoulder area. This is caused by air that is trapped between your liver and your diaphragm. The discomfort will slowly go away on its own.  Tiredness.  A sore throat if you had a breathing tube.  Nausea or vomiting. Follow these instructions at home: Medicines  Take over-the-counter and prescription medicines only as told by your health care provider.  Do not take aspirin because it can cause bleeding.  Do not drive or use heavy machinery while taking prescription pain medicine. Activity  Rest as told by your health care provider. Gradually return to your normal activities as told by your health care provider.  Avoid sitting for a long time without moving. Get up and move around one or more times every few hours.  Do not douche, use tampons, or have sexual intercourse for an entire menstrual cycle, or as told by your health care provider.  Do not lift anything that is heavier than 10 lb (4.5 kg), or as told by your health care provider.  Have someone help you with your daily household tasks for the first 7-10 days, or as recommended by your health care provider. Incision care   Follow instructions from your health care provider about how to take care of your incision. Make sure you: ? Wash your hands with soap and water before you change your bandage (dressing). If soap and water are not available, use hand sanitizer. ? Change  your dressing as told by your health care provider. ? Leave stitches (sutures), skin glue, or adhesive strips in place. These skin closures may need to stay in place for 2 weeks or longer. If adhesive strip edges start to loosen and curl up, you may trim the loose edges.  Do not take baths, swim, or use a hot tub until your health care provider approves. You may take showers.  Check your incision area every day for signs of infection. Check for: ? Redness, swelling, or pain. ? Fluid or blood. ? Warmth. ? Pus or a bad smell. General instructions  To prevent or treat constipation while you are taking prescription pain medicine, your health care provider may recommend that you: ? Drink enough fluid to keep your urine pale yellow. ? Take over-the-counter or prescription medicines. ? Eat foods that are high in fiber, such as fresh fruits and vegetables, whole grains, and beans. ? Limit foods that are high in fat and processed sugars, such as fried and sweet foods.  Do not use any products that contain nicotine or tobacco, such as cigarettes and e-cigarettes. These can delay healing. If you need help quitting, ask your health care provider.  Keep all follow-up visits as told by your health care provider. This is important. You will need to have a follow-up X-ray dye test about 3-4 months after the surgery to make sure that your tubes are open. Contact a health care provider if:  You have signs of infection, such as: ? Redness,  swelling, or pain around your incision sites. ? Fluid or blood coming from an incision. ? An incision that feels warm to the touch. ? Pus or a bad smell coming from an incision.  Your incision breaks open.  You have a fever.  You have a rash.  You feel dizzy or lightheaded.  You cannot eat or drink without vomiting.  You are constipated. Get help right away if:  You have shortness of breath or difficulty breathing.  You have chest or leg pain.  You  repeatedly feel lightheaded.  You faint.  You have increasing pain in your abdomen.  You feel a burning sensation or pain when you urinate.  You have heavy vaginal bleeding when it is not time for your menstrual period. Summary  It is common to have a sore throat, mild abdominal pain, and fatigue after the procedure.  Take over-the-counter and prescription medicines only as told by your health care provider.  Keep all follow-up visits as told by your health care provider. This is important. You will need to have a follow-up X-ray dye test about 3-4 months after the surgery to make sure that your tubes are open. This information is not intended to replace advice given to you by your health care provider. Make sure you discuss any questions you have with your health care provider. Document Released: 06/12/2009 Document Revised: 07/28/2017 Document Reviewed: 11/24/2016 Elsevier Patient Education  [employer]   1) The drugs that you were given will stay in your system until tomorrow so for the next 24 hours you should not:  A) Drive an automobile B) Make any legal decisions C) Drink any alcoholic beverage   2) You may resume regular meals tomorrow.  Today it is better to start with liquids and gradually work up to solid foods.  You may eat anything you prefer, but it is better to start with liquids, then soup and crackers, and gradually work up to solid foods.   3) Please notify your doctor immediately if you have any unusual bleeding, trouble breathing, redness and pain at the surgery site, drainage, fever, or pain not relieved by medication.    4) Additional Instructions:        Please contact your physician with any problems or Same Day Surgery at [phone], Monday through Friday 6 am to 4 pm, or [facility] number at [phone].

## 2019-05-10 NOTE — Op Note (Signed)
NAME: Tiffany Humphrey, ALVIDREZ MEDICAL RECORD [id number] ACCOUNT [id number] DATE OF BIRTH:04-Jun-1971 FACILITY: [facility] LOCATION: [facility] PHYSICIAN:Zariah Jost Josefine Class, MD  OPERATIVE REPORT  DATE OF PROCEDURE:  05/10/2019  PREOPERATIVE DIAGNOSIS: Elective sterilization.  POSTOPERATIVE DIAGNOSES:   1.  Elective sterilization. 2.  Right ovarian cyst.  PROCEDURE: 1.  Laparoscopic bilateral sterilization. 2.  Right ovarian cystotomy.  ANESTHESIA:  General endotracheal anesthesia.  SURGEON:  Laverta Baltimore, MD  INDICATIONS:  A 48 year old female desires elective permanent sterilization.  DESCRIPTION OF PROCEDURE:  After adequate general endotracheal anesthesia, the patient was placed in dorsal supine position with legs in [place].  The patient's abdomen, perineum and vagina were prepped and draped in normal sterile fashion.   Timeout was performed.  Straight catheterization of the bladder yielded 300 mL of clear urine.  A sponge stick was placed in the vagina to be used for uterine manipulation during the procedure.  Gloves were changed.  Attention was directed to the  patient's abdomen where a 5 mm infraumbilical incision was made after injecting with 0.5% Marcaine.  A 5 mm laparoscope was advanced into the abdominal cavity under direct visualization with the Optiview cannula.  The patient's abdomen was insufflated.   The patient was placed in Trendelenburg.  Uterus and fallopian tubes appeared normal.  There was a 3 x 2 cm right ovarian cyst that appeared simple.  A 7 mm Falope ring trocar was advanced 2 cm superior to the symphysis pubis under direct visualization.   The trocar was advanced into the abdominal cavity.  A right fallopian tube was grasped at the isthmic ampullary portion of fallopian tube and a Falope ring was applied with resulting 1.5 cm knuckle of fallopian tube.  A similar procedure was repeated on  the patient's left fallopian tube; however, with  placement of the fallopian tube, the fallopian tube was transected.  Therefore, cautery was brought up and the Kleppinger cautery was used to cauterize the fallopian tube in four separate sections.   Intraoperative pictures were taken.  Needle aspirator was brought up to the operative field and was used to deflate the right ovarian cyst.  The cul-de-sac appeared normal.  Upper abdomen appeared normal.  The patient's abdomen was deflated and each  incision site was closed with interrupted 4-0 Vicryl suture.  COMPLICATIONS:  There were no complications.  ESTIMATED BLOOD LOSS:  Minimal.  INTRAOPERATIVE FLUIDS:  600 mL.  URINE OUTPUT:  300 mL.  DISPOSITION:  The patient tolerated the procedure well and was taken to recovery room in good condition.  TN/NUANCE  D:05/10/2019 T:05/10/2019 JOB:[id number]

## 2019-05-10 NOTE — Brief Op Note (Signed)
05/10/2019  9:46 AM  PATIENT:  Tiffany Humphrey  48 y.o. female  PRE-OPERATIVE DIAGNOSIS:  elective sterilization  POST-OPERATIVE DIAGNOSIS:  elective sterilization  PROCEDURE:  Procedure(s): LAPAROSCOPIC TUBAL LIGATION, falope rings Right fallope ring  Left tubal cautery (Bilateral) RIGHT OVARIAN CYSTOSTOMY (Right)  SURGEON:  Surgeon(s) and Role:    * Maleeka Sabatino, Gwen Her, MD - Primary  PHYSICIAN ASSISTANT: scrub tech   ASSISTANTS: none   ANESTHESIA:   general  EBL: minimal , IOF 600 cc UO 300 cc BLOOD ADMINISTERED:none  DRAINS: none   LOCAL MEDICATIONS USED:  MARCAINE     SPECIMEN:  No Specimen  DISPOSITION OF SPECIMEN:  N/A  COUNTS:  YES  TOURNIQUET:  * No tourniquets in log *  DICTATION: .Other Dictation: Dictation Number verbal  PLAN OF CARE: Discharge to home after PACU  PATIENT DISPOSITION:  PACU - hemodynamically stable.   Delay start of Pharmacological VTE agent (>24hrs) due to surgical blood loss or risk of bleeding: not applicable

## 2019-05-10 NOTE — Anesthesia Postprocedure Evaluation (Signed)
Anesthesia Post Note  Patient: Amrit A Macapagal  Procedure(s) Performed: LAPAROSCOPIC TUBAL LIGATION, falope rings Right fallope ring  Left tubal cautery (Bilateral ) RIGHT OVARIAN CYSTOSTOMY (Right )  Patient location during evaluation: PACU Anesthesia Type: General Level of consciousness: awake and alert Pain management: pain level controlled Vital Signs Assessment: post-procedure vital signs reviewed and stable Respiratory status: spontaneous breathing, nonlabored ventilation and respiratory function stable Cardiovascular status: blood pressure returned to baseline and stable Postop Assessment: no apparent nausea or vomiting Anesthetic complications: no     Last Vitals:  Vitals:   05/10/19 1217 05/10/19 1245  BP: 121/75 102/70  Pulse: 94 83  Resp: 16 16  Temp: (!) 36.3 C   SpO2: 95% 99%    Last Pain:  Vitals:   05/10/19 1245  TempSrc:   PainSc: [place]

## 2019-05-10 NOTE — Anesthesia Procedure Notes (Signed)
Procedure Name: Intubation Date/Time: 05/10/2019 9:05 AM Performed by: Allean Found, CRNA Pre-anesthesia Checklist: Patient identified, Patient being monitored, Timeout performed, Emergency Drugs available and Suction available Patient Re-evaluated:Patient Re-evaluated prior to induction Oxygen Delivery Method: Circle system utilized Preoxygenation: Pre-oxygenation with 100% oxygen Induction Type: IV induction Ventilation: Mask ventilation without difficulty Laryngoscope Size: Mac and 3 Grade View: Grade I Tube type: Oral Tube size: 7.0 mm Number of attempts: 1 Airway Equipment and Method: Stylet Placement Confirmation: ETT inserted through vocal cords under direct vision,  positive ETCO2 and breath sounds checked- equal and bilateral Secured at: 21 cm Tube secured with: Tape Dental Injury: Teeth and Oropharynx as per pre-operative assessment

## 2019-05-10 NOTE — Transfer of Care (Signed)
Immediate Anesthesia Transfer of Care Note  Patient: Tiffany Humphrey  Procedure(s) Performed: LAPAROSCOPIC TUBAL LIGATION, falope rings Right fallope ring  Left tubal cautery (Bilateral ) RIGHT OVARIAN CYSTOSTOMY (Right )  Patient Location: PACU  Anesthesia Type:General  Level of Consciousness: awake and alert   Airway & Oxygen Therapy: Patient Spontanous Breathing and Patient connected to face mask oxygen  Post-op Assessment: Report given to RN and Post -op Vital signs reviewed and stable  Post vital signs: Reviewed and stable  Last Vitals:  Vitals Value Taken Time  BP 121/76 05/10/19 1000  Temp 36.3 C 05/10/19 1000  Pulse 113 05/10/19 1003  Resp 28 05/10/19 1003  SpO2 92 % 05/10/19 1003  Vitals shown include unvalidated device data.  Last Pain:  Vitals:   05/10/19 1000  TempSrc:   PainSc: Asleep         Complications: No apparent anesthesia complications

## 2019-05-10 NOTE — Anesthesia Preprocedure Evaluation (Addendum)
Anesthesia Evaluation  Patient identified by MRN, date of birth, ID band Patient awake    Reviewed: Allergy & Precautions, H&P , NPO status , Patient's Chart, lab work & pertinent test results  History of Anesthesia Complications (+) PONV and history of anesthetic complications  Airway Mallampati: II  TM Distance: >3 FB Neck ROM: full    Dental  (+) Teeth Intact   Pulmonary neg shortness of breath, neg COPD, former smoker,           Cardiovascular hypertension, (-) angina(-) Cardiac Stents (-) dysrhythmias      Neuro/Psych  Headaches, PSYCHIATRIC DISORDERS Anxiety    GI/Hepatic Neg liver ROS, GERD  Controlled,  Endo/Other  negative endocrine ROS  Renal/GU      Musculoskeletal   Abdominal   Peds  Hematology negative hematology ROS (+)   Anesthesia Other Findings Past Medical History: No date: Anxiety 06/2018: Breast cancer ([place])     Comment:  left breast No date: Cancer ([place])     Comment:  LEFT BREAST  No date: Complication of anesthesia     Comment:  panic attacks No date: GERD (gastroesophageal reflux disease) No date: Headache     Comment:  migraines/stress, optical migraines No date: History of kidney stones No date: Hypertension No date: Multilevel degenerative disc disease No date: Personal history of radiation therapy No date: PONV (postoperative nausea and vomiting) No date: Vertigo     Comment:  last episode 10/2016-NO PROBLEMS SINCE SINUS SURGERY IN               2018 No date: Wears contact lenses  Past Surgical History: 06/08/2016: BREAST BIOPSY; Right     Comment:  CYSTIC PAPILLARY APOCRINE METAPLASIA.  06/06/2018: BREAST BIOPSY; Left     Comment:  left breast stereo X clip positive 06/22/2018: BREAST EXCISIONAL BIOPSY; Left     Comment:  lumpectomy with sn and nl 06/22/2018: BREAST LUMPECTOMY; Left 12/22/2016: FRONTAL SINUS EXPLORATION; Bilateral     Comment:  Procedure: FRONTAL SINUS  EXPLORATION;  Surgeon: Margaretha [place], MD;  Location: [place];  Service:               ENT;  Laterality: Bilateral; 12/22/2016: IMAGE GUIDED SINUS SURGERY; N/A     Comment:  Procedure: IMAGE GUIDED SINUS SURGERY;  Surgeon: Margaretha [place], MD;  Location: [place];  Service:               ENT;  Laterality: N/A; 12/22/2016: MAXILLARY ANTROSTOMY; Bilateral     Comment:  Procedure: MAXILLARY ANTROSTOMY;  Surgeon: Margaretha [place],              MD;  Location: [place];  Service: ENT;                Laterality: Bilateral; 06/22/2018: PARTIAL MASTECTOMY WITH NEEDLE LOCALIZATION; Left     Comment:  Procedure: PARTIAL MASTECTOMY WITH NEEDLE LOCALIZATION;               Surgeon: Herbert Pun, MD;  Location: [facility];               Service: General;  Laterality: Left; 06/22/2018: SENTINEL NODE BIOPSY; Left     Comment:  Procedure: SENTINEL NODE BIOPSY;  Surgeon: Herbert Pun, MD;  Location: [facility];  Service: General;                Laterality: Left; 12/22/2016: SEPTOPLASTY; N/A     Comment:  Procedure: SEPTOPLASTY;  Surgeon: Margaretha [place], MD;                Location: [place];  Service: ENT;                Laterality: N/A;  GAVE DISK TO CECE 4-5 KP 12/22/2016: TURBINATE REDUCTION; N/A     Comment:  Procedure: TURBINATE REDUCTION partial inferior;                Surgeon: Margaretha [place], MD;  Location: [place];  Service: ENT;  Laterality: N/A;  BMI    Body Mass Index: 30.08 kg/m      Reproductive/Obstetrics negative OB ROS                           Anesthesia Physical Anesthesia Plan  ASA: II  Anesthesia Plan: General ETT   Post-op Pain Management:    Induction:   PONV Risk Score and Plan: Ondansetron, Dexamethasone, Midazolam, Treatment may vary due to age or medical condition and Scopolamine patch - Pre-op  Airway Management Planned:    Additional Equipment:   Intra-op Plan:   Post-operative Plan:   Informed Consent: I have reviewed the patients History and Physical, chart, labs and discussed the procedure including the risks, benefits and alternatives for the proposed anesthesia with the patient or authorized representative who has indicated his/her understanding and acceptance.     Dental Advisory Given  Plan Discussed with: Anesthesiologist and CRNA  Anesthesia Plan Comments:        Anesthesia Quick Evaluation

## 2019-05-10 NOTE — Progress Notes (Signed)
Pt still c/o pain 9/10 on right lower abdomen. Dr. Ola Spurr notified. Acknowledged. Orders received.

## 2019-05-27 ENCOUNTER — Other Ambulatory Visit: Payer: Managed Care, Other (non HMO)

## 2019-05-30 LAB — HM PAP SMEAR: HM Pap smear: ABNORMAL

## 2019-06-03 ENCOUNTER — Ambulatory Visit
Admission: RE | Admit: 2019-06-03 | Discharge: 2019-06-03 | Disposition: A | Discharge status: Home / Self Care | Payer: Managed Care, Other (non HMO) | Source: Ambulatory Visit | Attending: General Surgery | Admitting: General Surgery

## 2019-06-03 DIAGNOSIS — Z853 Personal history of malignant neoplasm of breast: Secondary | ICD-10-CM | POA: Insufficient documentation

## 2019-06-13 ENCOUNTER — Other Ambulatory Visit: Payer: Self-pay | Admitting: Oncology

## 2019-06-15 ENCOUNTER — Other Ambulatory Visit: Payer: Self-pay | Admitting: Oncology

## 2019-06-21 ENCOUNTER — Encounter: Payer: Self-pay | Admitting: Oncology

## 2019-06-25 ENCOUNTER — Ambulatory Visit: Payer: Managed Care, Other (non HMO) | Admitting: Family Medicine

## 2019-07-01 ENCOUNTER — Ambulatory Visit (INDEPENDENT_AMBULATORY_CARE_PROVIDER_SITE_OTHER): Payer: Managed Care, Other (non HMO) | Admitting: Family Medicine

## 2019-07-01 ENCOUNTER — Other Ambulatory Visit: Payer: Self-pay

## 2019-07-01 ENCOUNTER — Encounter: Payer: Self-pay | Admitting: Family Medicine

## 2019-07-01 VITALS — BP 120/80 | HR 80 | Ht 65.0 in | Wt 185.0 lb

## 2019-07-01 DIAGNOSIS — R5383 Other fatigue: Secondary | ICD-10-CM

## 2019-07-01 DIAGNOSIS — F419 Anxiety disorder, unspecified: Secondary | ICD-10-CM

## 2019-07-01 DIAGNOSIS — F329 Major depressive disorder, single episode, unspecified: Secondary | ICD-10-CM

## 2019-07-01 DIAGNOSIS — Z23 Encounter for immunization: Secondary | ICD-10-CM

## 2019-07-01 MED ORDER — ALPRAZOLAM 0.25 MG PO TABS: 0.2500 mg | ORAL_TABLET | Freq: Every day | ORAL | 1 refills | Status: DC | PRN

## 2019-07-01 MED ORDER — SERTRALINE HCL 50 MG PO TABS: 50.0000 mg | ORAL_TABLET | Freq: Every day | ORAL | 3 refills | Status: DC

## 2019-07-01 NOTE — Progress Notes (Signed)
Date:  07/01/2019   Name:  Tiffany Humphrey   DOB:  September 12, 1970   MRN:  [id number]   Chief Complaint: Anxiety (PHQ9=13 and GAD7=13) and tdap  Anxiety Presents for initial visit. Symptoms include decreased concentration, depressed mood, excessive worry, insomnia, irritability, nervous/anxious behavior, panic and restlessness. Patient reports no chest pain, dizziness, nausea, palpitations, shortness of breath or suicidal ideas. Symptoms occur occasionally. The severity of symptoms is mild. The quality of sleep is poor.   Past treatments include nothing.  Depression        This is a chronic problem.  The current episode started more than 1 year ago.   The onset quality is gradual.   The problem occurs daily.  The problem has been waxing and waning since onset.  Associated symptoms include decreased concentration, fatigue, helplessness, hopelessness, insomnia, irritable, restlessness and decreased interest.  Associated symptoms include no myalgias, no headaches and no suicidal ideas.  Past treatments include nothing.  Compliance with treatment is good.  Previous treatment provided mild relief.  Past medical history includes thyroid problem and anxiety.   Thyroid Problem Presents for initial visit. Symptoms include anxiety, depressed mood, fatigue, hair loss and weight gain. Patient reports no cold intolerance, constipation, diaphoresis, diarrhea, dry skin, heat intolerance, hoarse voice, leg swelling, menstrual problem, nail problem, palpitations or weight loss. The symptoms have been worsening.    Review of Systems  Constitutional: Positive for fatigue, irritability and weight gain. Negative for chills, diaphoresis, fever, unexpected weight change and weight loss.  HENT: Negative for congestion, ear discharge, ear pain, hoarse voice, rhinorrhea, sinus pressure, sneezing and sore throat.   Eyes: Negative for photophobia, pain, discharge, redness and itching.  Respiratory: Negative for cough,  shortness of breath, wheezing and stridor.   Cardiovascular: Negative for chest pain, palpitations and leg swelling.  Gastrointestinal: Negative for abdominal pain, blood in stool, constipation, diarrhea, nausea and vomiting.  Endocrine: Negative for cold intolerance, heat intolerance, polydipsia, polyphagia and polyuria.  Genitourinary: Negative for dysuria, flank pain, frequency, hematuria, menstrual problem, pelvic pain, urgency, vaginal bleeding and vaginal discharge.  Musculoskeletal: Negative for arthralgias, back pain and myalgias.  Skin: Negative for rash.  Allergic/Immunologic: Negative for environmental allergies and food allergies.  Neurological: Negative for dizziness, weakness, light-headedness, numbness and headaches.  Hematological: Negative for adenopathy. Does not bruise/bleed easily.  Psychiatric/Behavioral: Positive for decreased concentration and depression. Negative for dysphoric mood and suicidal ideas. The patient is nervous/anxious and has insomnia.     Patient Active Problem List   Diagnosis Date Noted  . 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 Genetic testing 01/14/2019  . Malignant neoplasm of upper-outer quadrant of left breast in female, estrogen receptor positive ([place]) 07/06/2018  . Breast cancer ([place]) 06/18/2018  . Migraines 08/03/2015    Allergies  Allergen Reactions  . Quinolones Hives  . Clindamycin Hives  . Codeine Other (See Comments) and Palpitations    tachycardia    Past Surgical History:  Procedure Laterality Date  . BREAST BIOPSY Right 06/08/2016   CYSTIC PAPILLARY APOCRINE METAPLASIA.   Marland Kitchen BREAST BIOPSY Left 06/06/2018   left breast stereo X clip positive  . BREAST EXCISIONAL BIOPSY Left 06/22/2018   lumpectomy with sn and nl  . BREAST LUMPECTOMY Left 06/22/2018  . CYSTOSTOMY Right 05/10/2019   Procedure: RIGHT OVARIAN CYSTOSTOMY;  Surgeon: Schermerhorn, Gwen Her, MD;  Location: [facility];  Service: Gynecology;  Laterality: Right;  . FRONTAL  SINUS EXPLORATION Bilateral 12/22/2016   Procedure: FRONTAL SINUS EXPLORATION;  Surgeon: Margaretha [place], MD;  Location: [place];  Service: ENT;  Laterality: Bilateral;  . IMAGE GUIDED SINUS SURGERY N/A 12/22/2016   Procedure: IMAGE GUIDED SINUS SURGERY;  Surgeon: Margaretha [place], MD;  Location: [place];  Service: ENT;  Laterality: N/A;  . LAPAROSCOPIC TUBAL LIGATION Bilateral 05/10/2019   Procedure: LAPAROSCOPIC TUBAL LIGATION, falope rings Right fallope ring  Left tubal cautery;  Surgeon: Schermerhorn, Gwen Her, MD;  Location: [facility];  Service: Gynecology;  Laterality: Bilateral;  . MAXILLARY ANTROSTOMY Bilateral 12/22/2016   Procedure: MAXILLARY ANTROSTOMY;  Surgeon: Margaretha [place], MD;  Location: [place];  Service: ENT;  Laterality: Bilateral;  . PARTIAL MASTECTOMY WITH NEEDLE LOCALIZATION Left 06/22/2018   Procedure: PARTIAL MASTECTOMY WITH NEEDLE LOCALIZATION;  Surgeon: Herbert Pun, MD;  Location: [facility];  Service: General;  Laterality: Left;  . SENTINEL NODE BIOPSY Left 06/22/2018   Procedure: SENTINEL NODE BIOPSY;  Surgeon: Herbert Pun, MD;  Location: [facility];  Service: General;  Laterality: Left;  . SEPTOPLASTY N/A 12/22/2016   Procedure: SEPTOPLASTY;  Surgeon: Margaretha [place], MD;  Location: [place];  Service: ENT;  Laterality: N/A;  GAVE DISK TO CECE 4-5 KP  . TURBINATE REDUCTION N/A 12/22/2016   Procedure: TURBINATE REDUCTION partial inferior;  Surgeon: Margaretha [place], MD;  Location: [place];  Service: ENT;  Laterality: N/A;    Social History   Tobacco Use  . Smoking status: Former Smoker    Packs/day: 1.00    Years: 15.00    Pack years: 15.00    Types: Cigarettes    Quit date: 2013    Years since quitting: 7.8  . Smokeless tobacco: Never Used  Substance Use Topics  . Alcohol use: Yes    Alcohol/week: 2.0 standard drinks    Types: 1 Glasses of wine, 1 Cans of beer per week    Comment: social (4-6  drinks/month)  . Drug use: No     Medication list has been reviewed and updated.  Current Meds  Medication Sig  . cloNIDine (CATAPRES - DOSED IN MG/24 HR) 0.2 mg/24hr patch Place onto the skin. Dr Ouida Sills  . gabapentin (NEURONTIN) 300 MG capsule TAKE 1 CAPSULE (300 MG) IN THE MORNING AND 2 CAPSULES (600 MG) AT NIGHT  . hydrochlorothiazide (MICROZIDE) 12.5 MG capsule Take 12.5 mg by mouth daily.   Marland Kitchen levocetirizine (XYZAL) 5 MG tablet Take 5 mg by mouth daily.   . naproxen sodium (ALEVE) 220 MG tablet Take 220-440 mg by mouth 2 (two) times daily as needed (pain.).  Marland Kitchen NON FORMULARY Place 3 drops under the tongue at bedtime. Allergy Oral Drops [facility] by [facility]   . omeprazole (PRILOSEC OTC) 20 MG tablet Take 40 mg by mouth daily before breakfast.   . tamoxifen (NOLVADEX) 20 MG tablet TAKE 1 TABLET BY MOUTH EVERY DAY  . traMADol (ULTRAM) 50 MG tablet Take 50 mg by mouth every 6 (six) hours as needed (pain).  . triamcinolone (NASACORT ALLERGY 24HR) 55 MCG/ACT AERO nasal inhaler Place 1 spray into the nose daily.  . vitamin B-12 (CYANOCOBALAMIN) 1000 MCG tablet Take 1,000 mcg by mouth daily.    PHQ 2/9 Scores 07/01/2019 05/29/2017 09/11/2015  PHQ - 2 Score 3 0 0  PHQ- 9 Score 13 0 -    BP Readings from Last 3 Encounters:  07/01/19 120/80  05/10/19 102/70  04/03/19 127/79    Physical Exam Vitals signs and nursing note reviewed.  Constitutional:      General: She is  irritable. She is not in acute distress.    Appearance: She is not diaphoretic.  HENT:     Head: Normocephalic and atraumatic.     Right Ear: Tympanic membrane, ear canal and external ear normal.     Left Ear: Tympanic membrane, ear canal and external ear normal.     Nose: Nose normal.  Eyes:     General:        Right eye: No discharge.        Left eye: No discharge.     Conjunctiva/sclera: Conjunctivae normal.     Pupils: Pupils are equal, round, and reactive to light.  Neck:     Musculoskeletal: Normal  range of motion and neck supple.     Thyroid: No thyroid mass, thyromegaly or thyroid tenderness.     Vascular: Normal carotid pulses. No carotid bruit, hepatojugular reflux or JVD.  Cardiovascular:     Rate and Rhythm: Normal rate and regular rhythm.     Heart sounds: Normal heart sounds. No murmur. No friction rub. No gallop.   Pulmonary:     Effort: Pulmonary effort is normal.     Breath sounds: Normal breath sounds.  Abdominal:     General: Bowel sounds are normal.     Palpations: Abdomen is soft. There is no mass.     Tenderness: There is no abdominal tenderness. There is no guarding.  Musculoskeletal: Normal range of motion.  Lymphadenopathy:     Cervical: No cervical adenopathy.     Right cervical: No superficial, deep or posterior cervical adenopathy.    Left cervical: No superficial, deep or posterior cervical adenopathy.  Skin:    General: Skin is warm and dry.  Neurological:     Mental Status: She is alert.     Deep Tendon Reflexes: Reflexes are normal and symmetric.     Wt Readings from Last 3 Encounters:  07/01/19 185 lb (83.9 kg)  05/10/19 180 lb 12.4 oz (82 kg)  05/03/19 180 lb (81.6 kg)    BP 120/80   Pulse 80   Ht 5\' 5"  (1.651 m)   Wt 185 lb (83.9 kg)   BMI 30.79 kg/m   Assessment and Plan: 1. Anxiety New onset.  Gad score 13.  Patient's been under an extremely significant amount of stress in the past several months.  We will initiate sertraline 50 mg starting at 25 mg once a day and tapering to 50 mg once a day.  Will recheck in 6 weeks.  2. Reactive depression Patient is PHQ 13.  This is a new onset problem.  As noted above patient is under a lot of stress.  Sertraline 25 mg to taper to 50 mg over a 8-day.  Patient will be rechecked in 6 weeks. - TSH  3. Fatigue, unspecified type Patient is also noted to have had more fatigue.  There is been 5 pounds of weight gain.  We will check a TSH to rule out hypothyroid condition. - TSH  4. Need for  diphtheria-tetanus-pertussis (Tdap) vaccine Discussed and administered - Tdap vaccine greater than or equal to 7yo IM

## 2019-07-02 LAB — TSH: TSH: 1.73 u[IU]/mL (ref 0.450–4.500)

## 2019-07-23 ENCOUNTER — Other Ambulatory Visit: Payer: Self-pay | Admitting: Family Medicine

## 2019-08-05 ENCOUNTER — Other Ambulatory Visit: Payer: Self-pay

## 2019-08-05 ENCOUNTER — Encounter: Payer: Self-pay | Admitting: Oncology

## 2019-08-05 NOTE — Progress Notes (Signed)
Patient stated that she continues to have "sharp pinch" on her left breast at least three times a week lasting a few seconds. Patient denied lumps/knots, skin discoloration and nipple discharge. Patient's last diagnostic mammogram was done on 06/07/2019. A diagnostic bilateral mammogram is suggested in 1 year per radiologist.

## 2019-08-06 ENCOUNTER — Inpatient Hospital Stay: Payer: Managed Care, Other (non HMO) | Attending: Oncology | Admitting: Oncology

## 2019-08-06 DIAGNOSIS — Z853 Personal history of malignant neoplasm of breast: Secondary | ICD-10-CM

## 2019-08-06 DIAGNOSIS — Z5181 Encounter for therapeutic drug level monitoring: Secondary | ICD-10-CM | POA: Diagnosis not present

## 2019-08-06 DIAGNOSIS — Z08 Encounter for follow-up examination after completed treatment for malignant neoplasm: Secondary | ICD-10-CM | POA: Diagnosis not present

## 2019-08-06 DIAGNOSIS — Z79810 Long term (current) use of selective estrogen receptor modulators (SERMs): Secondary | ICD-10-CM

## 2019-08-09 NOTE — Progress Notes (Signed)
I connected with Tiffany Humphrey on 08/09/19 at  1:15 PM EST by video enabled telemedicine visit and verified that I am speaking with the correct person using two identifiers.   I discussed the limitations, risks, security and privacy concerns of performing an evaluation and management service by telemedicine and the availability of in-person appointments. I also discussed with the patient that there may be a patient responsible charge related to this service. The patient expressed understanding and agreed to proceed.  Other persons participating in the visit and their role in the encounter:  none  Patient's location:  home Provider's location:  Work  Diagnosis- Pathological prognostic stage I A invasive mammary carcinoma T1a pN0 cM0 ER, PR positive her 2 negative   Chief Complaint: Routine follow-up of left breast stage I invasive mammary carcinoma  History of present illness: patient is a 48 year old female whose mother was recently diagnosed with DCIS. She recently underwent a screening mammogram which showed abnormal calcifications in her left breast. Diagnostic mammogram and ultrasound confirmed 9 x 5 x 5 mm calcifications. 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 There was a single focus worrisome but not diagnostic for microinvasion. Patient has met with surgery and is tentatively scheduled to undergo lumpectomy and sentinel lymph node biopsy next week. Patient states that she has used birth control for the last 25 years. She used to have regular. But about 5 months ago she did not have her menstrual cycles for about 5 months. At that point her birth control was temporarily heldand she did have her menstrual cycle after 5 months. She did restart her birth control at that point.She feels well today and denies any complaints. Her appetite is good and she denies any unintentional weight loss. She is G1P1L1.No other family history of breast cancer other than her mother  had DCIS.  Patient had a biopsy on 06/22/2018. Final pathology showed invasive mammary carcinoma 5 mm grade 2 ER greater than 90% positive PR 51 to 90% positive and HER-2/neu negative with negative margins. Sentinel lymph node was negative for malignancy. This was associated with high-grade DCIS.  oncotype score came back at 16. Plan is to proceed with OS plus AI. However patient had significant headache with the first dose of Lupron to the point that she could not function and headache persisted for almost a month.  Ovarian suppression was discontinued and patient was switched to tamoxifen.  Adjuvant radiation treatment completed in January 2020.  Interval history patient is currently taking Zoloft for her hot flashes as well as anxiety and it has been working well for her.  She has cut back on the dose of gabapentin and only uses it once a day.  Denies any pain or swelling at her breast site.   Review of Systems  Constitutional: Negative for chills, fever, malaise/fatigue and weight loss.  HENT: Negative for congestion, ear discharge and nosebleeds.   Eyes: Negative for blurred vision.  Respiratory: Negative for cough, hemoptysis, sputum production, shortness of breath and wheezing.   Cardiovascular: Negative for chest pain, palpitations, orthopnea and claudication.  Gastrointestinal: Negative for abdominal pain, blood in stool, constipation, diarrhea, heartburn, melena, nausea and vomiting.  Genitourinary: Negative for dysuria, flank pain, frequency, hematuria and urgency.  Musculoskeletal: Negative for back pain, joint pain and myalgias.  Skin: Negative for rash.  Neurological: Negative for dizziness, tingling, focal weakness, seizures, weakness and headaches.  Endo/Heme/Allergies: Does not bruise/bleed easily.  Psychiatric/Behavioral: Negative for depression and suicidal ideas. The patient does  not have insomnia.     Allergies  Allergen Reactions  . Quinolones Hives  .  Clindamycin Hives  . Codeine Other (See Comments) and Palpitations    tachycardia    Past Medical History:  Diagnosis Date  . Anxiety   . Breast cancer ([place]) 06/2018   left breast  . Cancer ([facility])    LEFT BREAST   . Complication of anesthesia    panic attacks  . GERD (gastroesophageal reflux disease)   . Headache    migraines/stress, optical migraines  . History of kidney stones   . Hypertension   . Multilevel degenerative disc disease   . Personal history of radiation therapy   . PONV (postoperative nausea and vomiting)   . Vertigo    last episode 10/2016-NO PROBLEMS SINCE SINUS SURGERY IN 2018  . Wears contact lenses     Past Surgical History:  Procedure Laterality Date  . BREAST BIOPSY Right 06/08/2016   CYSTIC PAPILLARY APOCRINE METAPLASIA.   Marland Kitchen BREAST BIOPSY Left 06/06/2018   left breast stereo X clip positive  . BREAST EXCISIONAL BIOPSY Left 06/22/2018   lumpectomy with sn and nl  . BREAST LUMPECTOMY Left 06/22/2018  . CYSTOSTOMY Right 05/10/2019   Procedure: RIGHT OVARIAN CYSTOSTOMY;  Surgeon: Schermerhorn, Gwen Her, MD;  Location: [facility];  Service: Gynecology;  Laterality: Right;  . FRONTAL SINUS EXPLORATION Bilateral 12/22/2016   Procedure: FRONTAL SINUS EXPLORATION;  Surgeon: Margaretha Sheffield, MD;  Location: [place];  Service: ENT;  Laterality: Bilateral;  . IMAGE GUIDED SINUS SURGERY N/A 12/22/2016   Procedure: IMAGE GUIDED SINUS SURGERY;  Surgeon: Margaretha Sheffield, MD;  Location: [place];  Service: ENT;  Laterality: N/A;  . LAPAROSCOPIC TUBAL LIGATION Bilateral 05/10/2019   Procedure: LAPAROSCOPIC TUBAL LIGATION, falope rings Right fallope ring  Left tubal cautery;  Surgeon: Schermerhorn, Gwen Her, MD;  Location: [facility];  Service: Gynecology;  Laterality: Bilateral;  . MAXILLARY ANTROSTOMY Bilateral 12/22/2016   Procedure: MAXILLARY ANTROSTOMY;  Surgeon: Margaretha Sheffield, MD;  Location: [place];  Service: ENT;  Laterality: Bilateral;  .  PARTIAL MASTECTOMY WITH NEEDLE LOCALIZATION Left 06/22/2018   Procedure: PARTIAL MASTECTOMY WITH NEEDLE LOCALIZATION;  Surgeon: Herbert Pun, MD;  Location: [facility];  Service: General;  Laterality: Left;  . SENTINEL NODE BIOPSY Left 06/22/2018   Procedure: SENTINEL NODE BIOPSY;  Surgeon: Herbert Pun, MD;  Location: [facility];  Service: General;  Laterality: Left;  . SEPTOPLASTY N/A 12/22/2016   Procedure: SEPTOPLASTY;  Surgeon: Margaretha Sheffield, MD;  Location: [place];  Service: ENT;  Laterality: N/A;  GAVE DISK TO CECE 4-5 KP  . TURBINATE REDUCTION N/A 12/22/2016   Procedure: TURBINATE REDUCTION partial inferior;  Surgeon: Margaretha Sheffield, MD;  Location: [place];  Service: ENT;  Laterality: N/A;    Social History   Socioeconomic History  . Marital status: Married    Spouse name: Not on file  . Number of children: Not on file  . Years of education: Not on file  . Highest education level: Not on file  Occupational History  . Not on file  Tobacco Use  . Smoking status: Former Smoker    Packs/day: 1.00    Years: 15.00    Pack years: 15.00    Types: Cigarettes    Quit date: 2013    Years since quitting: 7.9  . Smokeless tobacco: Never Used  Substance and Sexual Activity  . Alcohol use: Yes    Alcohol/week: 2.0 standard drinks  Types: 1 Glasses of wine, 1 Cans of beer per week    Comment: social (4-6 drinks/month)  . Drug use: No  . Sexual activity: Yes  Other Topics Concern  . Not on file  Social History Narrative  . Not on file   Social Determinants of Health   Financial Resource Strain:   . Difficulty of Paying Living Expenses: Not on file  Food Insecurity:   . Worried About Charity fundraiser in the Last Year: Not on file  . Ran Out of Food in the Last Year: Not on file  Transportation Needs:   . Lack of Transportation (Medical): Not on file  . Lack of Transportation (Non-Medical): Not on file  Physical Activity:   . Days of  Exercise per Week: Not on file  . Minutes of Exercise per Session: Not on file  Stress:   . Feeling of Stress : Not on file  Social Connections:   . Frequency of Communication with Friends and Family: Not on file  . Frequency of Social Gatherings with Friends and Family: Not on file  . Attends Religious Services: Not on file  . Active Member of Clubs or Organizations: Not on file  . Attends Archivist Meetings: Not on file  . Marital Status: Not on file  Intimate Partner Violence:   . Fear of Current or Ex-Partner: Not on file  . Emotionally Abused: Not on file  . Physically Abused: Not on file  . Sexually Abused: Not on file    Family History  Problem Relation Age of Onset  . Breast cancer Mother        dx 44s; 2nd primary at 57  . Breast cancer Paternal Grandmother        dx 36s; deceased 22s  . Breast cancer Paternal Aunt        dx 2s; currently 40s  . Breast cancer Cousin        dx 40s; currently late 20s; daughter of a paternal uncle  . Lung cancer Paternal Uncle        smoker; deceased     Current Outpatient Medications:  .  ALPRAZolam (XANAX) 0.25 MG tablet, Take 1 tablet (0.25 mg total) by mouth daily as needed for anxiety., Disp: 30 tablet, Rfl: 1 .  gabapentin (NEURONTIN) 300 MG capsule, TAKE 1 CAPSULE (300 MG) IN THE MORNING AND 2 CAPSULES (600 MG) AT NIGHT, Disp: 270 capsule, Rfl: 0 .  hydrochlorothiazide (MICROZIDE) 12.5 MG capsule, Take 12.5 mg by mouth daily. , Disp: , Rfl:  .  levocetirizine (XYZAL) 5 MG tablet, Take 5 mg by mouth daily. , Disp: , Rfl:  .  naproxen sodium (ALEVE) 220 MG tablet, Take 220-440 mg by mouth 2 (two) times daily as needed (pain.)., Disp: , Rfl:  .  NON FORMULARY, Place 3 drops under the tongue at bedtime. Allergy Oral Drops made by [facility] , Disp: , Rfl:  .  omeprazole (PRILOSEC OTC) 20 MG tablet, Take 40 mg by mouth daily before breakfast. , Disp: , Rfl:  .  sertraline (ZOLOFT) 50 MG tablet, TAKE 1 TABLET (50 MG  TOTAL) BY MOUTH DAILY. INITIATE ONE HALF TABLET A DAY FOR 8 DAYS THEN 1 A DAY, Disp: 90 tablet, Rfl: 0 .  tamoxifen (NOLVADEX) 20 MG tablet, TAKE 1 TABLET BY MOUTH EVERY DAY, Disp: 90 tablet, Rfl: 1 .  traMADol (ULTRAM) 50 MG tablet, Take 50 mg by mouth every 6 (six) hours as needed (pain)., Disp: ,  Rfl:  .  triamcinolone (NASACORT ALLERGY 24HR) 55 MCG/ACT AERO nasal inhaler, Place 1 spray into the nose daily., Disp: , Rfl:  .  vitamin B-12 (CYANOCOBALAMIN) 1000 MCG tablet, Take 1,000 mcg by mouth daily., Disp: , Rfl:   No results found.  No images are attached to the encounter.   CMP Latest Ref Rng & Units 05/07/2019  Glucose 70 - 99 mg/dL 87  BUN 6 - 20 mg/dL 11  Creatinine 0.44 - 1.00 mg/dL 0.78  Sodium 135 - 145 mmol/L 141  Potassium 3.5 - 5.1 mmol/L 3.4(L)  Chloride 98 - 111 mmol/L 105  CO2 22 - 32 mmol/L 25  Calcium 8.9 - 10.3 mg/dL 9.2  Total Protein 6.5 - 8.1 g/dL -  Total Bilirubin 0.3 - 1.2 mg/dL -  Alkaline Phos 38 - 126 U/L -  AST 15 - 41 U/L -  ALT 0 - 44 U/L -   CBC Latest Ref Rng & Units 05/07/2019  WBC 4.0 - 10.5 K/uL 6.3  Hemoglobin 12.0 - 15.0 g/dL 14.8  Hematocrit 36.0 - 46.0 % 44.5  Platelets 150 - 400 K/uL 301     Observation/objective: Appears in no acute distress on video visit today.  Breathing is nonlabored  Assessment and plan: Patient is a 48 year old female with ER positive DCIS on biopsy.  Final pathology showed stage I apT1 apN0 cM0 invasive mammary carcinoma grade 2 ER/PR positive HER-2/neu negative s/p lumpectomy and adjuvant radiation treatment.  She did not require adjuvant chemotherapy.  She is currently on tamoxifen for hormone therapy  1.  Patient will continue tamoxifen for at least 5 years.  She is currently tolerating it well without any significant side effects recent mammogram from October 2020 did not show any evidence of malignancy.  I will see her back in 6 months  2.  Hot flashes: Currently well controlled on Zoloft  Follow-up  instructions: Follow-up with me in 6 months  I discussed the assessment and treatment plan with the patient. The patient was provided an opportunity to ask questions and all were answered. The patient agreed with the plan and demonstrated an understanding of the instructions.   The patient was advised to call back or seek an in-person evaluation if the symptoms worsen or if the condition fails to improve as anticipated.   Visit Diagnosis: 1. Encounter for monitoring tamoxifen therapy   2. Encounter for follow-up surveillance of breast cancer     Dr. Randa Evens, MD, MPH [facility] at [employer] Pager- [phone] 08/09/2019 8:15 AM

## 2019-08-15 ENCOUNTER — Ambulatory Visit (INDEPENDENT_AMBULATORY_CARE_PROVIDER_SITE_OTHER): Payer: Managed Care, Other (non HMO) | Admitting: Family Medicine

## 2019-08-15 ENCOUNTER — Other Ambulatory Visit: Payer: Self-pay

## 2019-08-15 ENCOUNTER — Encounter: Payer: Self-pay | Admitting: Family Medicine

## 2019-08-15 VITALS — BP 118/80 | HR 72 | Ht 65.0 in | Wt 186.0 lb

## 2019-08-15 DIAGNOSIS — F329 Major depressive disorder, single episode, unspecified: Secondary | ICD-10-CM

## 2019-08-15 DIAGNOSIS — E663 Overweight: Secondary | ICD-10-CM | POA: Diagnosis not present

## 2019-08-15 MED ORDER — SERTRALINE HCL 50 MG PO TABS: 50.0000 mg | ORAL_TABLET | Freq: Every day | ORAL | 1 refills | Status: DC

## 2019-08-15 NOTE — Progress Notes (Signed)
Date:  08/15/2019   Name:  Tiffany Humphrey   DOB:  03/06/1971   MRN:  [id number]   Chief Complaint: Follow-up (PHQ=0 and GAD7=0)  Depression        This is a chronic problem.  The current episode started more than 1 year ago.   The problem has been gradually improving since onset.  Associated symptoms include no decreased concentration, no fatigue, no helplessness, no hopelessness, does not have insomnia, not irritable, no restlessness, no decreased interest, no appetite change, no body aches, no myalgias, no headaches, no indigestion, not sad and no suicidal ideas.     The symptoms are aggravated by nothing.  Past treatments include SSRIs - Selective serotonin reuptake inhibitors.  Compliance with treatment is good.  Previous treatment provided moderate relief.  Past medical history includes anxiety.   Anxiety Presents for follow-up visit. Patient reports no chest pain, compulsions, confusion, decreased concentration, depressed mood, dizziness, dry mouth, excessive worry, feeling of choking, hyperventilation, impotence, insomnia, irritability, malaise, muscle tension, nausea, nervous/anxious behavior, obsessions, palpitations, panic, restlessness, shortness of breath or suicidal ideas. The severity of symptoms is mild.      Lab Results  Component Value Date   CREATININE 0.78 05/07/2019   BUN 11 05/07/2019   NA 141 05/07/2019   K 3.4 (L) 05/07/2019   CL 105 05/07/2019   CO2 25 05/07/2019   No results found for: CHOL, HDL, LDLCALC, LDLDIRECT, TRIG, CHOLHDL Lab Results  Component Value Date   TSH 1.730 07/01/2019   No results found for: HGBA1C   Review of Systems  Constitutional: Negative for appetite change, chills, fatigue, fever and irritability.  HENT: Negative for drooling, ear discharge, ear pain and sore throat.   Respiratory: Negative for cough, shortness of breath and wheezing.   Cardiovascular: Negative for chest pain, palpitations and leg swelling.  Gastrointestinal:  Negative for abdominal pain, blood in stool, constipation, diarrhea and nausea.  Endocrine: Negative for polydipsia.  Genitourinary: Negative for dysuria, frequency, hematuria, impotence and urgency.  Musculoskeletal: Negative for back pain, myalgias and neck pain.  Skin: Negative for rash.  Allergic/Immunologic: Negative for environmental allergies.  Neurological: Negative for dizziness and headaches.  Hematological: Does not bruise/bleed easily.  Psychiatric/Behavioral: Positive for depression. Negative for confusion, decreased concentration and suicidal ideas. The patient is not nervous/anxious and does not have insomnia.     Patient Active Problem List   Diagnosis Date Noted  . 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 Genetic testing 01/14/2019  . Malignant neoplasm of upper-outer quadrant of left breast in female, estrogen receptor positive ([place]) 07/06/2018  . Breast cancer ([place]) 06/18/2018  . Migraines 08/03/2015    Allergies  Allergen Reactions  . Quinolones Hives  . Clindamycin Hives  . Codeine Other (See Comments) and Palpitations    tachycardia    Past Surgical History:  Procedure Laterality Date  . BREAST BIOPSY Right 06/08/2016   CYSTIC PAPILLARY APOCRINE METAPLASIA.   Marland Kitchen BREAST BIOPSY Left 06/06/2018   left breast stereo X clip positive  . BREAST EXCISIONAL BIOPSY Left 06/22/2018   lumpectomy with sn and nl  . BREAST LUMPECTOMY Left 06/22/2018  . CYSTOSTOMY Right 05/10/2019   Procedure: RIGHT OVARIAN CYSTOSTOMY;  Surgeon: Schermerhorn, Gwen Her, MD;  Location: [facility];  Service: Gynecology;  Laterality: Right;  . FRONTAL SINUS EXPLORATION Bilateral 12/22/2016   Procedure: FRONTAL SINUS EXPLORATION;  Surgeon: Margaretha Sheffield, MD;  Location: [place];  Service: ENT;  Laterality: Bilateral;  . IMAGE GUIDED SINUS  SURGERY N/A 12/22/2016   Procedure: IMAGE GUIDED SINUS SURGERY;  Surgeon: Margaretha Sheffield, MD;  Location: [place];  Service: ENT;  Laterality: N/A;   . LAPAROSCOPIC TUBAL LIGATION Bilateral 05/10/2019   Procedure: LAPAROSCOPIC TUBAL LIGATION, falope rings Right fallope ring  Left tubal cautery;  Surgeon: Schermerhorn, Gwen Her, MD;  Location: [facility];  Service: Gynecology;  Laterality: Bilateral;  . MAXILLARY ANTROSTOMY Bilateral 12/22/2016   Procedure: MAXILLARY ANTROSTOMY;  Surgeon: Margaretha Sheffield, MD;  Location: [place];  Service: ENT;  Laterality: Bilateral;  . PARTIAL MASTECTOMY WITH NEEDLE LOCALIZATION Left 06/22/2018   Procedure: PARTIAL MASTECTOMY WITH NEEDLE LOCALIZATION;  Surgeon: Herbert Pun, MD;  Location: [facility];  Service: General;  Laterality: Left;  . SENTINEL NODE BIOPSY Left 06/22/2018   Procedure: SENTINEL NODE BIOPSY;  Surgeon: Herbert Pun, MD;  Location: [facility];  Service: General;  Laterality: Left;  . SEPTOPLASTY N/A 12/22/2016   Procedure: SEPTOPLASTY;  Surgeon: Margaretha Sheffield, MD;  Location: [place];  Service: ENT;  Laterality: N/A;  GAVE DISK TO CECE 4-5 KP  . TURBINATE REDUCTION N/A 12/22/2016   Procedure: TURBINATE REDUCTION partial inferior;  Surgeon: Margaretha Sheffield, MD;  Location: [place];  Service: ENT;  Laterality: N/A;    Social History   Tobacco Use  . Smoking status: Former Smoker    Packs/day: 1.00    Years: 15.00    Pack years: 15.00    Types: Cigarettes    Quit date: 2013    Years since quitting: 7.9  . Smokeless tobacco: Never Used  Substance Use Topics  . Alcohol use: Yes    Alcohol/week: 2.0 standard drinks    Types: 1 Glasses of wine, 1 Cans of beer per week    Comment: social (4-6 drinks/month)  . Drug use: No     Medication list has been reviewed and updated.  Current Meds  Medication Sig  . gabapentin (NEURONTIN) 300 MG capsule TAKE 1 CAPSULE (300 MG) IN THE MORNING AND 2 CAPSULES (600 MG) AT NIGHT (Patient taking differently: Take 1 capsule (300 mg) at night)  . hydrochlorothiazide (MICROZIDE) 12.5 MG capsule Take 12.5 mg by  mouth daily.   Marland Kitchen levocetirizine (XYZAL) 5 MG tablet Take 5 mg by mouth daily.   . naproxen sodium (ALEVE) 220 MG tablet Take 220-440 mg by mouth 2 (two) times daily as needed (pain.).  Marland Kitchen NON FORMULARY Place 3 drops under the tongue at bedtime. Allergy Oral Drops [facility] by [facility]   . omeprazole (PRILOSEC OTC) 20 MG tablet Take 40 mg by mouth daily before breakfast.   . sertraline (ZOLOFT) 50 MG tablet TAKE 1 TABLET (50 MG TOTAL) BY MOUTH DAILY. INITIATE ONE HALF TABLET A DAY FOR 8 DAYS THEN 1 A DAY  . tamoxifen (NOLVADEX) 20 MG tablet TAKE 1 TABLET BY MOUTH EVERY DAY  . traMADol (ULTRAM) 50 MG tablet Take 50 mg by mouth every 6 (six) hours as needed (pain).  . triamcinolone (NASACORT ALLERGY 24HR) 55 MCG/ACT AERO nasal inhaler Place 1 spray into the nose daily.  . vitamin B-12 (CYANOCOBALAMIN) 1000 MCG tablet Take 1,000 mcg by mouth daily.    PHQ 2/9 Scores 08/15/2019 07/01/2019 05/29/2017 09/11/2015  PHQ - 2 Score 0 3 0 0  PHQ- 9 Score 0 13 0 -    BP Readings from Last 3 Encounters:  08/15/19 118/80  07/01/19 120/80  05/10/19 102/70    Physical Exam Vitals and nursing note reviewed.  Constitutional:  General: She is not irritable.    Appearance: She is well-developed.  HENT:     Head: Normocephalic.     Right Ear: Tympanic membrane, ear canal and external ear normal.     Left Ear: Tympanic membrane, ear canal and external ear normal.     Nose: No congestion or rhinorrhea.     Mouth/Throat:     Mouth: Mucous membranes are moist.     Pharynx: Oropharynx is clear.  Eyes:     General: Lids are everted, no foreign bodies appreciated. No scleral icterus.       Left eye: No foreign body or hordeolum.     Conjunctiva/sclera: Conjunctivae normal.     Right eye: Right conjunctiva is not injected.     Left eye: Left conjunctiva is not injected.     Pupils: Pupils are equal, round, and reactive to light.  Neck:     Thyroid: No thyromegaly.     Vascular: No JVD.     Trachea:  No tracheal deviation.  Cardiovascular:     Rate and Rhythm: Normal rate and regular rhythm.     Heart sounds: Normal heart sounds. No murmur. No friction rub. No gallop.   Pulmonary:     Effort: Pulmonary effort is normal. No respiratory distress.     Breath sounds: Normal breath sounds. No stridor. No wheezing, rhonchi or rales.  Chest:     Chest wall: No tenderness.  Abdominal:     General: Bowel sounds are normal.     Palpations: Abdomen is soft. There is no mass.     Tenderness: There is no abdominal tenderness. There is no guarding or rebound.  Musculoskeletal:        General: No tenderness. Normal range of motion.     Cervical back: Normal range of motion and neck supple.  Lymphadenopathy:     Cervical: No cervical adenopathy.  Skin:    General: Skin is warm.     Capillary Refill: Capillary refill takes less than 2 seconds.     Findings: No rash.  Neurological:     Mental Status: She is alert and oriented to person, place, and time.     Cranial Nerves: No cranial nerve deficit.     Deep Tendon Reflexes: Reflexes normal.  Psychiatric:        Mood and Affect: Mood is not anxious or depressed.     Wt Readings from Last 3 Encounters:  08/15/19 186 lb (84.4 kg)  07/01/19 185 lb (83.9 kg)  05/10/19 180 lb 12.4 oz (82 kg)    BP 118/80   Pulse 72   Ht 5\' 5"  (1.651 m)   Wt 186 lb (84.4 kg)   BMI 30.95 kg/m   Assessment and Plan:   1. Reactive depression New onset.  Controlled.  Stable.  Patient went from a PHQ of 16 to 0 on recheck of her PHQ scale.  We will continue at 50 mg once a day.  And will recheck her in 6 months. - sertraline (ZOLOFT) 50 MG tablet; Take 1 tablet (50 mg total) by mouth daily. Initiate one half tablet a day for 8 days then 1 a day  Dispense: 90 tablet; Refill: 1  2. Overweight (BMI 25.0-29.9) Health risks of being over weight were discussed and patient was counseled on weight loss options and exercise.  Patient was given a Mediterranean diet  guidelines.

## 2019-08-15 NOTE — Patient Instructions (Signed)

## 2019-09-20 ENCOUNTER — Encounter: Payer: Self-pay | Admitting: Oncology

## 2019-10-08 ENCOUNTER — Other Ambulatory Visit: Payer: Self-pay

## 2019-10-09 ENCOUNTER — Ambulatory Visit: Payer: Managed Care, Other (non HMO) | Attending: Radiation Oncology | Admitting: Radiation Oncology

## 2019-12-05 ENCOUNTER — Other Ambulatory Visit: Payer: Self-pay | Admitting: Oncology

## 2020-01-31 ENCOUNTER — Other Ambulatory Visit: Payer: Self-pay

## 2020-01-31 ENCOUNTER — Ambulatory Visit: Payer: Managed Care, Other (non HMO) | Admitting: Family Medicine

## 2020-01-31 ENCOUNTER — Encounter: Payer: Self-pay | Admitting: Family Medicine

## 2020-01-31 VITALS — BP 118/82 | HR 92 | Ht 65.0 in | Wt 192.0 lb

## 2020-01-31 DIAGNOSIS — F5101 Primary insomnia: Secondary | ICD-10-CM | POA: Diagnosis not present

## 2020-01-31 DIAGNOSIS — F419 Anxiety disorder, unspecified: Secondary | ICD-10-CM | POA: Diagnosis not present

## 2020-01-31 DIAGNOSIS — F329 Major depressive disorder, single episode, unspecified: Secondary | ICD-10-CM | POA: Diagnosis not present

## 2020-01-31 MED ORDER — TRAZODONE HCL 50 MG PO TABS: 25.0000 mg | ORAL_TABLET | Freq: Every evening | ORAL | 5 refills | Status: DC | PRN

## 2020-01-31 MED ORDER — ALPRAZOLAM 0.25 MG PO TABS: 0.2500 mg | ORAL_TABLET | Freq: Every day | ORAL | 2 refills | Status: DC | PRN

## 2020-01-31 MED ORDER — SERTRALINE HCL 50 MG PO TABS: 50.0000 mg | ORAL_TABLET | Freq: Every day | ORAL | 1 refills | Status: DC

## 2020-01-31 NOTE — Progress Notes (Signed)
Date:  01/31/2020   Name:  Tiffany Humphrey   DOB:  12/25/1970   MRN:  [id number]   Chief Complaint: Depression (Follow up- 6 months. RFs for Zoloft and Xanax.)  Depression        The patient presents with no depression.  This is a chronic problem.  The current episode started more than 1 year ago.   The onset quality is gradual.   The problem has been gradually improving since onset.  Associated symptoms include fatigue and insomnia.  Associated symptoms include no decreased concentration, no helplessness, no hopelessness, not irritable, no restlessness, no decreased interest, no appetite change, no body aches, no myalgias, no headaches, no indigestion, not sad and no suicidal ideas.     The symptoms are aggravated by work stress.  Past treatments include SSRIs - Selective serotonin reuptake inhibitors.  Compliance with treatment is good.  Previous treatment provided moderate relief.   Pertinent negatives include no chronic fatigue syndrome, no chronic pain, no fibromyalgia, no hypothyroidism, no thyroid problem, no chronic illness, no recent illness, no life-threatening condition, no physical disability, no terminal illness, no recent psychiatric admission, no Alzheimer's disease, no brain trauma, no dementia, no anxiety, no bipolar disorder, no eating disorder, no depression, no mental health disorder, no obsessive-compulsive disorder, no post-traumatic stress disorder, no schizophrenia, no suicide attempts and no head trauma. Anxiety Presents for follow-up visit. Symptoms include insomnia and nervous/anxious behavior. Patient reports no chest pain, compulsions, confusion, decreased concentration, depressed mood, dizziness, dry mouth, excessive worry, feeling of choking, hyperventilation, impotence, irritability, malaise, muscle tension, nausea, obsessions, palpitations, panic, restlessness, shortness of breath or suicidal ideas. Symptoms occur occasionally.   There is no history of depression or  suicide attempts.  Insomnia Primary symptoms: fragmented sleep, difficulty falling asleep.  The current episode started more than one year. The onset quality is gradual. The problem is unchanged. PMH includes: depression.    Lab Results  Component Value Date   CREATININE 0.78 05/07/2019   BUN 11 05/07/2019   NA 141 05/07/2019   K 3.4 (L) 05/07/2019   CL 105 05/07/2019   CO2 25 05/07/2019   No results found for: CHOL, HDL, LDLCALC, LDLDIRECT, TRIG, CHOLHDL Lab Results  Component Value Date   TSH 1.730 07/01/2019   No results found for: HGBA1C Lab Results  Component Value Date   WBC 6.3 05/07/2019   HGB 14.8 05/07/2019   HCT 44.5 05/07/2019   MCV 87.8 05/07/2019   PLT 301 05/07/2019   Lab Results  Component Value Date   ALT 17 10/29/2018   AST 20 10/29/2018   ALKPHOS 65 10/29/2018   BILITOT 0.7 10/29/2018     Review of Systems  Constitutional: Positive for fatigue. Negative for appetite change, chills, fever, irritability and unexpected weight change.  HENT: Negative for congestion, ear discharge, ear pain, rhinorrhea, sinus pressure, sneezing and sore throat.   Eyes: Negative for photophobia, pain, discharge, redness and itching.  Respiratory: Negative for cough, shortness of breath, wheezing and stridor.   Cardiovascular: Negative for chest pain and palpitations.  Gastrointestinal: Negative for abdominal pain, blood in stool, constipation, diarrhea, nausea and vomiting.  Endocrine: Negative for cold intolerance, heat intolerance, polydipsia, polyphagia and polyuria.  Genitourinary: Negative for dysuria, flank pain, frequency, hematuria, impotence, menstrual problem, pelvic pain, urgency, vaginal bleeding and vaginal discharge.  Musculoskeletal: Negative for arthralgias, back pain and myalgias.  Skin: Negative for rash.  Allergic/Immunologic: Negative for environmental allergies and food allergies.  Neurological: Negative for  dizziness, weakness, light-headedness,  numbness and headaches.  Hematological: Negative for adenopathy. Does not bruise/bleed easily.  Psychiatric/Behavioral: Positive for depression. Negative for confusion, decreased concentration, dysphoric mood and suicidal ideas. The patient is nervous/anxious and has insomnia.     Patient Active Problem List   Diagnosis Date Noted  . 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 Genetic testing 01/14/2019  . Malignant neoplasm of upper-outer quadrant of left breast in female, estrogen receptor positive ([place]) 07/06/2018  . Breast cancer ([place]) 06/18/2018  . Migraines 08/03/2015    Allergies  Allergen Reactions  . Quinolones Hives  . Clindamycin Hives  . Codeine Other (See Comments) and Palpitations    tachycardia    Past Surgical History:  Procedure Laterality Date  . BREAST BIOPSY Right 06/08/2016   CYSTIC PAPILLARY APOCRINE METAPLASIA.   Marland Kitchen BREAST BIOPSY Left 06/06/2018   left breast stereo X clip positive  . BREAST EXCISIONAL BIOPSY Left 06/22/2018   lumpectomy with sn and nl  . BREAST LUMPECTOMY Left 06/22/2018  . CYSTOSTOMY Right 05/10/2019   Procedure: RIGHT OVARIAN CYSTOSTOMY;  Surgeon: Schermerhorn, Gwen Her, MD;  Location: [facility];  Service: Gynecology;  Laterality: Right;  . FRONTAL SINUS EXPLORATION Bilateral 12/22/2016   Procedure: FRONTAL SINUS EXPLORATION;  Surgeon: Margaretha Sheffield, MD;  Location: [place];  Service: ENT;  Laterality: Bilateral;  . IMAGE GUIDED SINUS SURGERY N/A 12/22/2016   Procedure: IMAGE GUIDED SINUS SURGERY;  Surgeon: Margaretha Sheffield, MD;  Location: [place];  Service: ENT;  Laterality: N/A;  . LAPAROSCOPIC TUBAL LIGATION Bilateral 05/10/2019   Procedure: LAPAROSCOPIC TUBAL LIGATION, falope rings Right fallope ring  Left tubal cautery;  Surgeon: Schermerhorn, Gwen Her, MD;  Location: [facility];  Service: Gynecology;  Laterality: Bilateral;  . MAXILLARY ANTROSTOMY Bilateral 12/22/2016   Procedure: MAXILLARY ANTROSTOMY;  Surgeon: Margaretha Sheffield,  MD;  Location: [place];  Service: ENT;  Laterality: Bilateral;  . PARTIAL MASTECTOMY WITH NEEDLE LOCALIZATION Left 06/22/2018   Procedure: PARTIAL MASTECTOMY WITH NEEDLE LOCALIZATION;  Surgeon: Herbert Pun, MD;  Location: [facility];  Service: General;  Laterality: Left;  . SENTINEL NODE BIOPSY Left 06/22/2018   Procedure: SENTINEL NODE BIOPSY;  Surgeon: Herbert Pun, MD;  Location: [facility];  Service: General;  Laterality: Left;  . SEPTOPLASTY N/A 12/22/2016   Procedure: SEPTOPLASTY;  Surgeon: Margaretha Sheffield, MD;  Location: [place];  Service: ENT;  Laterality: N/A;  GAVE DISK TO CECE 4-5 KP  . TURBINATE REDUCTION N/A 12/22/2016   Procedure: TURBINATE REDUCTION partial inferior;  Surgeon: Margaretha Sheffield, MD;  Location: [place];  Service: ENT;  Laterality: N/A;    Social History   Tobacco Use  . Smoking status: Former Smoker    Packs/day: 1.00    Years: 15.00    Pack years: 15.00    Types: Cigarettes    Quit date: 2013    Years since quitting: 8.4  . Smokeless tobacco: Never Used  Substance Use Topics  . Alcohol use: Yes    Alcohol/week: 2.0 standard drinks    Types: 1 Glasses of wine, 1 Cans of beer per week    Comment: social (4-6 drinks/month)  . Drug use: No     Medication list has been reviewed and updated.  Current Meds  Medication Sig  . ALPRAZolam (XANAX) 0.25 MG tablet Take 1 tablet (0.25 mg total) by mouth daily as needed for anxiety.  . hydrochlorothiazide (MICROZIDE) 12.5 MG capsule Take 12.5 mg by mouth daily.   Marland Kitchen levocetirizine (XYZAL) 5  MG tablet Take 5 mg by mouth daily.   . naproxen sodium (ALEVE) 220 MG tablet Take 220-440 mg by mouth 2 (two) times daily as needed (pain.).  Marland Kitchen NON FORMULARY Place 3 drops under the tongue at bedtime. Allergy Oral Drops [facility] by [facility]   . omeprazole (PRILOSEC OTC) 20 MG tablet Take 40 mg by mouth daily before breakfast.   . sertraline (ZOLOFT) 50 MG tablet Take 1 tablet (50  mg total) by mouth daily. Initiate one half tablet a day for 8 days then 1 a day  . tamoxifen (NOLVADEX) 20 MG tablet TAKE 1 TABLET BY MOUTH EVERY DAY  . traMADol (ULTRAM) 50 MG tablet Take 50 mg by mouth every 6 (six) hours as needed (pain).  . triamcinolone (NASACORT ALLERGY 24HR) 55 MCG/ACT AERO nasal inhaler Place 1 spray into the nose daily.  . vitamin B-12 (CYANOCOBALAMIN) 1000 MCG tablet Take 1,000 mcg by mouth daily.    PHQ 2/9 Scores 01/31/2020 08/15/2019 07/01/2019 05/29/2017  PHQ - 2 Score 0 0 3 0  PHQ- 9 Score 6 0 13 0    BP Readings from Last 3 Encounters:  01/31/20 118/82  08/15/19 118/80  07/01/19 120/80    Physical Exam Vitals and nursing note reviewed.  Constitutional:      General: She is not irritable.    Appearance: She is well-developed.  HENT:     Head: Normocephalic.     Right Ear: Tympanic membrane, ear canal and external ear normal.     Left Ear: Tympanic membrane, ear canal and external ear normal.     Nose: Nose normal.     Mouth/Throat:     Mouth: Mucous membranes are moist.  Eyes:     General: Lids are everted, no foreign bodies appreciated. No scleral icterus.       Left eye: No foreign body or hordeolum.     Conjunctiva/sclera: Conjunctivae normal.     Right eye: Right conjunctiva is not injected.     Left eye: Left conjunctiva is not injected.     Pupils: Pupils are equal, round, and reactive to light.  Neck:     Thyroid: No thyromegaly.     Vascular: No JVD.     Trachea: No tracheal deviation.  Cardiovascular:     Rate and Rhythm: Normal rate and regular rhythm.     Heart sounds: Normal heart sounds, S1 normal and S2 normal. No murmur. No systolic murmur. No diastolic murmur. No friction rub. No gallop. No S3 or S4 sounds.   Pulmonary:     Effort: Pulmonary effort is normal. No respiratory distress.     Breath sounds: Normal breath sounds. No wheezing, rhonchi or rales.  Abdominal:     General: Bowel sounds are normal.     Palpations:  Abdomen is soft. There is no mass.     Tenderness: There is no abdominal tenderness. There is no guarding or rebound.  Musculoskeletal:        General: No tenderness. Normal range of motion.     Cervical back: Normal range of motion and neck supple.     Right lower leg: No edema.     Left lower leg: No edema.  Lymphadenopathy:     Cervical: No cervical adenopathy.  Skin:    General: Skin is warm.     Findings: No rash.  Neurological:     Mental Status: She is alert and oriented to person, place, and time.     Cranial Nerves:  No cranial nerve deficit.     Deep Tendon Reflexes: Reflexes normal.  Psychiatric:        Mood and Affect: Mood is not anxious or depressed.     Wt Readings from Last 3 Encounters:  01/31/20 192 lb (87.1 kg)  08/15/19 186 lb (84.4 kg)  07/01/19 185 lb (83.9 kg)    BP 118/82   Pulse 92   Ht 5\' 5"  (1.651 m)   Wt 192 lb (87.1 kg)   SpO2 99%   BMI 31.95 kg/m   Assessment and Plan:  1. Reactive depression Chronic.  Partial controlled.  Stable on medication.  PHQ score 6 we will continue sertraline 50 mg 1 a day.- sertraline (ZOLOFT) 50 MG tablet; Take 1 tablet (50 mg total) by mouth daily. Initiate one half tablet a day for 8 days then 1 a day  Dispense: 90 tablet; Refill: 1 - ALPRAZolam (XANAX) 0.25 MG tablet; Take 1 tablet (0.25 mg total) by mouth daily as needed for anxiety.  Dispense: 30 tablet; Refill: 2  2. Anxiety Chronic.  Controlled.  Stable.  Gad score 0.  Continue sertraline 50 mg once a day.  We will also continue alprazolam on an as-needed basis for panic attacks. - sertraline (ZOLOFT) 50 MG tablet; Take 1 tablet (50 mg total) by mouth daily. Initiate one half tablet a day for 8 days then 1 a day  Dispense: 90 tablet; Refill: 1 - ALPRAZolam (XANAX) 0.25 MG tablet; Take 1 tablet (0.25 mg total) by mouth daily as needed for anxiety.  Dispense: 30 tablet; Refill: 2  3. Primary insomnia Chronic.  New problem.  Patient has had increasingly more  difficult with insomnia and having stabilized on SSRI we will initiate some trazodone 25 to 50 mg as needed for sleep as needed. - traZODone (DESYREL) 50 MG tablet; Take 0.5-1 tablets (25-50 mg total) by mouth at bedtime as needed for sleep.  Dispense: 30 tablet; Refill: 5

## 2020-02-03 ENCOUNTER — Inpatient Hospital Stay: Payer: Managed Care, Other (non HMO) | Attending: Oncology

## 2020-02-03 ENCOUNTER — Other Ambulatory Visit: Payer: Self-pay

## 2020-02-03 DIAGNOSIS — Z170 Estrogen receptor positive status [ER+]: Secondary | ICD-10-CM | POA: Diagnosis not present

## 2020-02-03 DIAGNOSIS — Z87891 Personal history of nicotine dependence: Secondary | ICD-10-CM | POA: Diagnosis not present

## 2020-02-03 DIAGNOSIS — N951 Menopausal and female climacteric states: Secondary | ICD-10-CM | POA: Diagnosis not present

## 2020-02-03 DIAGNOSIS — I10 Essential (primary) hypertension: Secondary | ICD-10-CM | POA: Diagnosis not present

## 2020-02-03 DIAGNOSIS — Z79810 Long term (current) use of selective estrogen receptor modulators (SERMs): Secondary | ICD-10-CM | POA: Diagnosis not present

## 2020-02-03 DIAGNOSIS — Z803 Family history of malignant neoplasm of breast: Secondary | ICD-10-CM | POA: Diagnosis not present

## 2020-02-03 DIAGNOSIS — R232 Flushing: Secondary | ICD-10-CM | POA: Insufficient documentation

## 2020-02-03 DIAGNOSIS — F419 Anxiety disorder, unspecified: Secondary | ICD-10-CM | POA: Insufficient documentation

## 2020-02-03 DIAGNOSIS — C50912 Malignant neoplasm of unspecified site of left female breast: Secondary | ICD-10-CM | POA: Insufficient documentation

## 2020-02-03 DIAGNOSIS — Z08 Encounter for follow-up examination after completed treatment for malignant neoplasm: Secondary | ICD-10-CM

## 2020-02-03 LAB — COMPREHENSIVE METABOLIC PANEL
ALT: 21 U/L (ref 0–44)
AST: 25 U/L (ref 15–41)
Albumin: 4 g/dL (ref 3.5–5.0)
Alkaline Phosphatase: 76 U/L (ref 38–126)
Anion gap: 10 (ref 5–15)
BUN: 11 mg/dL (ref 6–20)
CO2: 25 mmol/L (ref 22–32)
Calcium: 8.5 mg/dL — ABNORMAL LOW (ref 8.9–10.3)
Chloride: 106 mmol/L (ref 98–111)
Creatinine, Ser: 0.79 mg/dL (ref 0.44–1.00)
GFR calc Af Amer: >60 mL/min (ref >60)
GFR calc non Af Amer: >60 mL/min (ref >60)
Glucose, Bld: 102 mg/dL — ABNORMAL HIGH (ref 70–99)
Potassium: 4.1 mmol/L (ref 3.5–5.1)
Sodium: 141 mmol/L (ref 135–145)
Total Bilirubin: 0.5 mg/dL (ref 0.3–1.2)
Total Protein: 7.2 g/dL (ref 6.5–8.1)

## 2020-02-04 ENCOUNTER — Inpatient Hospital Stay (HOSPITAL_BASED_OUTPATIENT_CLINIC_OR_DEPARTMENT_OTHER): Payer: Managed Care, Other (non HMO) | Admitting: Oncology

## 2020-02-04 DIAGNOSIS — Z5181 Encounter for therapeutic drug level monitoring: Secondary | ICD-10-CM | POA: Diagnosis not present

## 2020-02-04 DIAGNOSIS — Z79810 Long term (current) use of selective estrogen receptor modulators (SERMs): Secondary | ICD-10-CM | POA: Diagnosis not present

## 2020-02-04 DIAGNOSIS — Z853 Personal history of malignant neoplasm of breast: Secondary | ICD-10-CM | POA: Diagnosis not present

## 2020-02-04 DIAGNOSIS — Z08 Encounter for follow-up examination after completed treatment for malignant neoplasm: Secondary | ICD-10-CM

## 2020-02-04 NOTE — Progress Notes (Signed)
No concerns at this time. `

## 2020-02-07 NOTE — Progress Notes (Signed)
I connected with Tiffany Humphrey on 02/07/20 at  1:15 PM EDT by video enabled telemedicine visit and verified that I am speaking with the correct person using two identifiers.   I discussed the limitations, risks, security and privacy concerns of performing an evaluation and management service by telemedicine and the availability of in-person appointments. I also discussed with the patient that there may be a patient responsible charge related to this service. The patient expressed understanding and agreed to proceed.  Other persons participating in the visit and their role in the encounter:  none  Patient's location:  home Provider's location:  work  Risk analyst Complaint: Routine follow-up of breast cancer  History of present illness: patient is a 49 year old female whose mother was recently diagnosed with DCIS. She recently underwent a screening mammogram which showed abnormal calcifications in her left breast. Diagnostic mammogram and ultrasound confirmed 9 x 5 x 5 mm calcifications. 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 There was a single focus worrisome but not diagnostic for microinvasion. Patient has met with surgery and is tentatively scheduled to undergo lumpectomy and sentinel lymph node biopsy next week. Patient states that she has used birth control for the last 25 years. She used to have regular. But about 5 months ago she did not have her menstrual cycles for about 5 months. At that point her birth control was temporarily heldand she did have her menstrual cycle after 5 months. She did restart her birth control at that point.She feels well today and denies any complaints. Her appetite is good and she denies any unintentional weight loss. She is G1P1L1.No other family history of breast cancer other than her mother had DCIS.  Patient had a biopsy on 06/22/2018. Final pathology showed invasive mammary carcinoma 5 mm grade 2 ER greater than 90% positive PR 51 to  90% positive and HER-2/neu negative with negative margins. Sentinel lymph node was negative for malignancy. This was associated with high-grade DCIS.  oncotype score came back at 16. Plan is to proceed with OS plus AI. However patient had significant headache with the first dose of Lupron to the point that she could not function and headache persisted for almost a month.  Ovarian suppression was discontinued and patient was switched to tamoxifen.  Adjuvant radiation treatment completed in January 2020.   Interval history patient is currently taking tamoxifen along with calcium and vitamin D.  Hot flashes have resolved.  She has occasional pain at the site of lumpectomy.  Appetite and weight have remained stable.  She denies other complaints at this time   Review of Systems  Constitutional: Negative for chills, fever, malaise/fatigue and weight loss.  HENT: Negative for congestion, ear discharge and nosebleeds.   Eyes: Negative for blurred vision.  Respiratory: Negative for cough, hemoptysis, sputum production, shortness of breath and wheezing.   Cardiovascular: Negative for chest pain, palpitations, orthopnea and claudication.  Gastrointestinal: Negative for abdominal pain, blood in stool, constipation, diarrhea, heartburn, melena, nausea and vomiting.  Genitourinary: Negative for dysuria, flank pain, frequency, hematuria and urgency.  Musculoskeletal: Negative for back pain, joint pain and myalgias.  Skin: Negative for rash.  Neurological: Negative for dizziness, tingling, focal weakness, seizures, weakness and headaches.  Endo/Heme/Allergies: Does not bruise/bleed easily.  Psychiatric/Behavioral: Negative for depression and suicidal ideas. The patient does not have insomnia.     Allergies  Allergen Reactions  . Quinolones Hives  . Clindamycin Hives  . Codeine Other (See Comments) and Palpitations    tachycardia  Past Medical History:  Diagnosis Date  . Anxiety   . Breast  cancer ([place]) 06/2018   left breast  . Cancer ([facility])    LEFT BREAST   . Complication of anesthesia    panic attacks  . GERD (gastroesophageal reflux disease)   . Headache    migraines/stress, optical migraines  . History of kidney stones   . Hypertension   . Multilevel degenerative disc disease   . Personal history of radiation therapy   . PONV (postoperative nausea and vomiting)   . Vertigo    last episode 10/2016-NO PROBLEMS SINCE SINUS SURGERY IN 2018  . Wears contact lenses     Past Surgical History:  Procedure Laterality Date  . BREAST BIOPSY Right 06/08/2016   CYSTIC PAPILLARY APOCRINE METAPLASIA.   Marland Kitchen BREAST BIOPSY Left 06/06/2018   left breast stereo X clip positive  . BREAST EXCISIONAL BIOPSY Left 06/22/2018   lumpectomy with sn and nl  . BREAST LUMPECTOMY Left 06/22/2018  . CYSTOSTOMY Right 05/10/2019   Procedure: RIGHT OVARIAN CYSTOSTOMY;  Surgeon: Schermerhorn, Gwen Her, MD;  Location: [facility];  Service: Gynecology;  Laterality: Right;  . FRONTAL SINUS EXPLORATION Bilateral 12/22/2016   Procedure: FRONTAL SINUS EXPLORATION;  Surgeon: Margaretha Sheffield, MD;  Location: [place];  Service: ENT;  Laterality: Bilateral;  . IMAGE GUIDED SINUS SURGERY N/A 12/22/2016   Procedure: IMAGE GUIDED SINUS SURGERY;  Surgeon: Margaretha Sheffield, MD;  Location: [place];  Service: ENT;  Laterality: N/A;  . LAPAROSCOPIC TUBAL LIGATION Bilateral 05/10/2019   Procedure: LAPAROSCOPIC TUBAL LIGATION, falope rings Right fallope ring  Left tubal cautery;  Surgeon: Schermerhorn, Gwen Her, MD;  Location: [facility];  Service: Gynecology;  Laterality: Bilateral;  . MAXILLARY ANTROSTOMY Bilateral 12/22/2016   Procedure: MAXILLARY ANTROSTOMY;  Surgeon: Margaretha Sheffield, MD;  Location: [place];  Service: ENT;  Laterality: Bilateral;  . PARTIAL MASTECTOMY WITH NEEDLE LOCALIZATION Left 06/22/2018   Procedure: PARTIAL MASTECTOMY WITH NEEDLE LOCALIZATION;  Surgeon: Herbert Pun,  MD;  Location: [facility];  Service: General;  Laterality: Left;  . SENTINEL NODE BIOPSY Left 06/22/2018   Procedure: SENTINEL NODE BIOPSY;  Surgeon: Herbert Pun, MD;  Location: [facility];  Service: General;  Laterality: Left;  . SEPTOPLASTY N/A 12/22/2016   Procedure: SEPTOPLASTY;  Surgeon: Margaretha Sheffield, MD;  Location: [place];  Service: ENT;  Laterality: N/A;  GAVE DISK TO CECE 4-5 KP  . TURBINATE REDUCTION N/A 12/22/2016   Procedure: TURBINATE REDUCTION partial inferior;  Surgeon: Margaretha Sheffield, MD;  Location: [place];  Service: ENT;  Laterality: N/A;    Social History   Socioeconomic History  . Marital status: Married    Spouse name: Not on file  . Number of children: Not on file  . Years of education: Not on file  . Highest education level: Not on file  Occupational History  . Not on file  Tobacco Use  . Smoking status: Former Smoker    Packs/day: 1.00    Years: 15.00    Pack years: 15.00    Types: Cigarettes    Quit date: 2013    Years since quitting: 8.4  . Smokeless tobacco: Never Used  Vaping Use  . Vaping Use: Former  Substance and Sexual Activity  . Alcohol use: Yes    Alcohol/week: 2.0 standard drinks    Types: 1 Glasses of wine, 1 Cans of beer per week    Comment: social (4-6 drinks/month)  . Drug use: No  . Sexual  activity: Yes  Other Topics Concern  . Not on file  Social History Narrative  . Not on file   Social Determinants of Health   Financial Resource Strain:   . Difficulty of Paying Living Expenses:   Food Insecurity:   . Worried About Charity fundraiser in the Last Year:   . Arboriculturist in the Last Year:   Transportation Needs:   . Film/video editor (Medical):   Marland Kitchen Lack of Transportation (Non-Medical):   Physical Activity:   . Days of Exercise per Week:   . Minutes of Exercise per Session:   Stress:   . Feeling of Stress :   Social Connections:   . Frequency of Communication with Friends and Family:    . Frequency of Social Gatherings with Friends and Family:   . Attends Religious Services:   . Active Member of Clubs or Organizations:   . Attends Archivist Meetings:   Marland Kitchen Marital Status:   Intimate Partner Violence:   . Fear of Current or Ex-Partner:   . Emotionally Abused:   Marland Kitchen Physically Abused:   . Sexually Abused:     Family History  Problem Relation Age of Onset  . Breast cancer Mother        dx 102s; 2nd primary at 72  . Breast cancer Paternal Grandmother        dx 73s; deceased 40s  . Breast cancer Paternal Aunt        dx 62s; currently 49s  . Breast cancer Cousin        dx 10s; currently late 24s; daughter of a paternal uncle  . Lung cancer Paternal Uncle        smoker; deceased     Current Outpatient Medications:  .  ALPRAZolam (XANAX) 0.25 MG tablet, Take 1 tablet (0.25 mg total) by mouth daily as needed for anxiety., Disp: 30 tablet, Rfl: 2 .  hydrochlorothiazide (MICROZIDE) 12.5 MG capsule, Take 12.5 mg by mouth daily. , Disp: , Rfl:  .  levocetirizine (XYZAL) 5 MG tablet, Take 5 mg by mouth daily. , Disp: , Rfl:  .  naproxen sodium (ALEVE) 220 MG tablet, Take 220-440 mg by mouth 2 (two) times daily as needed (pain.)., Disp: , Rfl:  .  NON FORMULARY, Place 3 drops under the tongue at bedtime. [facility]ergy Oral Drops made by [facility] , Disp: , Rfl:  .  omeprazole (PRILOSEC OTC) 20 MG tablet, Take 40 mg by mouth daily before breakfast. , Disp: , Rfl:  .  sertraline (ZOLOFT) 50 MG tablet, Take 1 tablet (50 mg total) by mouth daily. Initiate one half tablet a day for 8 days then 1 a day, Disp: 90 tablet, Rfl: 1 .  tamoxifen (NOLVADEX) 20 MG tablet, TAKE 1 TABLET BY MOUTH EVERY DAY, Disp: 90 tablet, Rfl: 1 .  traMADol (ULTRAM) 50 MG tablet, Take 50 mg by mouth every 6 (six) hours as needed (pain)., Disp: , Rfl:  .  traZODone (DESYREL) 50 MG tablet, Take 0.5-1 tablets (25-50 mg total) by mouth at bedtime as needed for sleep., Disp: 30 tablet, Rfl: 5 .   triamcinolone (NASACORT ALLERGY 24HR) 55 MCG/ACT AERO nasal inhaler, Place 1 spray into the nose daily., Disp: , Rfl:  .  vitamin B-12 (CYANOCOBALAMIN) 1000 MCG tablet, Take 1,000 mcg by mouth daily., Disp: , Rfl:   No results found.  No images are attached to the encounter.   CMP Latest Ref Rng & Units 02/03/2020  Glucose 70 - 99 mg/dL 102(H)  BUN 6 - 20 mg/dL 11  Creatinine 0.44 - 1.00 mg/dL 0.79  Sodium 135 - 145 mmol/L 141  Potassium 3.5 - 5.1 mmol/L 4.1  Chloride 98 - 111 mmol/L 106  CO2 22 - 32 mmol/L 25  Calcium 8.9 - 10.3 mg/dL 8.5(L)  Total Protein 6.5 - 8.1 g/dL 7.2  Total Bilirubin 0.3 - 1.2 mg/dL 0.5  Alkaline Phos 38 - 126 U/L 76  AST 15 - 41 U/L 25  ALT 0 - 44 U/L 21   CBC Latest Ref Rng & Units 05/07/2019  WBC 4.0 - 10.5 K/uL 6.3  Hemoglobin 12.0 - 15.0 g/dL 14.8  Hematocrit 36 - 46 % 44.5  Platelets 150 - 400 K/uL 301     Observation/objective: Appears in no acute distress over video visit today.  Breathing is nonlabored  Assessment and plan: Patient is a 49 year old female with ER positive DCIS on biopsy.  Final pathology showed stage I apT1 apN0 cM0 invasive mammary carcinoma grade 2 ER/PR positive HER-2/neu negative s/p lumpectomy and adjuvant radiation treatment.  She did not require adjuvant chemotherapy.  She is currently on tamoxifen.  This is a routine follow-up visit for breast cancer  Patient is tolerating tamoxifen well and will continue to take that along with calcium and vitamin D.  I will see her back in 6 months for breast exam.  She is due for mammogram in oct 2021 which will be coordinated by Dr. Peyton Najjar  Hot flashes: stable on zoloft  Follow-up instructions:as above  I discussed the assessment and treatment plan with the patient. The patient was provided an opportunity to ask questions and all were answered. The patient agreed with the plan and demonstrated an understanding of the instructions.   The patient was advised to call back or seek an  in-person evaluation if the symptoms worsen or if the condition fails to improve as anticipated.    Visit Diagnosis: 1. Encounter for monitoring tamoxifen therapy   2. Encounter for follow-up surveillance of breast cancer     Dr. Randa Evens, MD, MPH [facility] Tel- [phone] 02/07/2020 12:02 PM

## 2020-02-14 ENCOUNTER — Ambulatory Visit: Payer: Managed Care, Other (non HMO) | Admitting: Family Medicine

## 2020-02-22 ENCOUNTER — Other Ambulatory Visit: Payer: Self-pay | Admitting: Family Medicine

## 2020-02-22 DIAGNOSIS — F5101 Primary insomnia: Secondary | ICD-10-CM

## 2020-02-22 NOTE — Telephone Encounter (Signed)
Requested Prescriptions  Pending Prescriptions Disp Refills   traZODone (DESYREL) 50 MG tablet [Pharmacy Med Name: TRAZODONE 50 MG TABLET] 90 tablet 1    Sig: TAKE 0.5-1 TABLETS (25-50 MG TOTAL) BY MOUTH AT BEDTIME AS NEEDED FOR SLEEP.     Psychiatry: Antidepressants - Serotonin Modulator Passed - 02/22/2020 10:30 AM      Passed - Valid encounter within last 6 months    Recent Outpatient Visits          3 weeks ago Reactive depression   [facility] Juline Patch, MD   6 months ago Reactive depression   [facility] Juline Patch, MD   7 months ago [facility] Juline Patch, MD   1 year ago [facility] Juline Patch, MD   1 year ago Harriet Pho disease (radial styloid tenosynovitis)   [facility] Juline Patch, MD      Future Appointments            In 4 months Juline Patch, MD [facility], [facility]

## 2020-05-08 ENCOUNTER — Other Ambulatory Visit: Payer: Self-pay | Admitting: General Surgery

## 2020-05-08 DIAGNOSIS — Z853 Personal history of malignant neoplasm of breast: Secondary | ICD-10-CM

## 2020-05-15 ENCOUNTER — Encounter: Payer: Self-pay | Admitting: Oncology

## 2020-05-15 ENCOUNTER — Inpatient Hospital Stay: Payer: Managed Care, Other (non HMO) | Attending: Oncology | Admitting: Oncology

## 2020-05-15 ENCOUNTER — Other Ambulatory Visit: Payer: Self-pay

## 2020-05-15 VITALS — BP 139/73 | HR 88 | Temp 97.8°F | Resp 16 | Wt 198.7 lb

## 2020-05-15 DIAGNOSIS — I10 Essential (primary) hypertension: Secondary | ICD-10-CM | POA: Diagnosis not present

## 2020-05-15 DIAGNOSIS — Z79899 Other long term (current) drug therapy: Secondary | ICD-10-CM | POA: Insufficient documentation

## 2020-05-15 DIAGNOSIS — Z79810 Long term (current) use of selective estrogen receptor modulators (SERMs): Secondary | ICD-10-CM | POA: Diagnosis not present

## 2020-05-15 DIAGNOSIS — Z87891 Personal history of nicotine dependence: Secondary | ICD-10-CM | POA: Diagnosis not present

## 2020-05-15 DIAGNOSIS — F419 Anxiety disorder, unspecified: Secondary | ICD-10-CM | POA: Diagnosis not present

## 2020-05-15 DIAGNOSIS — D0512 Intraductal carcinoma in situ of left breast: Secondary | ICD-10-CM | POA: Diagnosis present

## 2020-05-15 DIAGNOSIS — R221 Localized swelling, mass and lump, neck: Secondary | ICD-10-CM | POA: Diagnosis present

## 2020-05-15 NOTE — Telephone Encounter (Signed)
I can see her today at 215 or next week in person.  She is complaining of swelling in her neck area

## 2020-05-19 NOTE — Progress Notes (Signed)
Hematology/Oncology Consult note [facility]  [phone] [phone]  Patient Care Team: Juline Patch, MD as PCP - General (Family Medicine)   Name of the patient: Tiffany Humphrey  [id number]  Jan 04, 1971   Date of visit: 05/19/20  Diagnosis-history of stage I left breast cancer  Chief complaint/ Reason for visit-acute visit for neck swelling  Heme/Onc history: patient is a 49- year-old female whose mother was recently diagnosed with DCIS. She recently underwent a screening mammogram which showed abnormal calcifications in her left breast. Diagnostic mammogram and ultrasound confirmed 9 x 5 x 5 mm calcifications. 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 There was a single focus worrisome but not diagnostic for microinvasion. Patient has met with surgery and is tentatively scheduled to undergo lumpectomy and sentinel lymph node biopsy next week. Patient states that she has used birth control for the last 25 years. She used to have regular. But about 5 months ago she did not have her menstrual cycles for about 5 months. At that point her birth control was temporarily heldand she did have her menstrual cycle after 5 months. She did restart her birth control at that point.She feels well today and denies any complaints. Her appetite is good and she denies any unintentional weight loss. She is G1P1L1.No other family history of breast cancer other than her mother had DCIS.  Patient had a biopsy on 06/22/2018. Final pathology showed invasive mammary carcinoma 5 mm grade 2 ER greater than 90% positive PR 51 to 90% positive and HER-2/neu negative with negative margins. Sentinel lymph node was negative for malignancy. This was associated with high-grade DCIS.  oncotype score came back at 16. Plan is to proceed with OS plus AI. However patient had significant headache with the first dose of Lupron to the point that she  could not function and headache persisted for almost a month.Ovarian suppression was discontinued and patient was switched to tamoxifen. Adjuvant radiation treatment completed in January 2020.    Interval history-reports palpating a left neck swelling today which got her worried.  Otherwise she is tolerating tamoxifen well without any significant side effects  ECOG PS- 0 Pain scale- 0   Review of systems- Review of Systems  Constitutional: Negative for chills, fever, malaise/fatigue and weight loss.  HENT: Negative for congestion, ear discharge and nosebleeds.        Neck swelling  Eyes: Negative for blurred vision.  Respiratory: Negative for cough, hemoptysis, sputum production, shortness of breath and wheezing.   Cardiovascular: Negative for chest pain, palpitations, orthopnea and claudication.  Gastrointestinal: Negative for abdominal pain, blood in stool, constipation, diarrhea, heartburn, melena, nausea and vomiting.  Genitourinary: Negative for dysuria, flank pain, frequency, hematuria and urgency.  Musculoskeletal: Negative for back pain, joint pain and myalgias.  Skin: Negative for rash.  Neurological: Negative for dizziness, tingling, focal weakness, seizures, weakness and headaches.  Endo/Heme/Allergies: Does not bruise/bleed easily.  Psychiatric/Behavioral: Negative for depression and suicidal ideas. The patient does not have insomnia.        Allergies  Allergen Reactions  . Quinolones Hives  . Clindamycin Hives  . Codeine Other (See Comments) and Palpitations    tachycardia     Past Medical History:  Diagnosis Date  . Anxiety   . Breast cancer ([place]) 06/2018   left breast  . Cancer ([facility])    LEFT BREAST   . Complication of anesthesia    panic attacks  . GERD (gastroesophageal reflux disease)   .  Headache    migraines/stress, optical migraines  . History of kidney stones   . Hypertension   . Multilevel degenerative disc disease   . Personal history of  radiation therapy   . PONV (postoperative nausea and vomiting)   . Vertigo    last episode 10/2016-NO PROBLEMS SINCE SINUS SURGERY IN 2018  . Wears contact lenses      Past Surgical History:  Procedure Laterality Date  . BREAST BIOPSY Right 06/08/2016   CYSTIC PAPILLARY APOCRINE METAPLASIA.   Mar[facility] Kitchen BREAST BIOPSY Left 06/06/2018   left breast stereo X clip positive  . BREAST EXCISIONAL BIOPSY Left 06/22/2018   lumpectomy with sn and nl  . BREAST LUMPECTOMY Left 06/22/2018  . CYSTOSTOMY Right 05/10/2019   Procedure: RIGHT OVARIAN CYSTOSTOMY;  Surgeon: Schermerhorn, Gwen Her, MD;  Location: [facility];  Service: Gynecology;  Laterality: Right;  . FRONTAL SINUS EXPLORATION Bilateral 12/22/2016   Procedure: FRONTAL SINUS EXPLORATION;  Surgeon: Margaretha Sheffield, MD;  Location: [place];  Service: ENT;  Laterality: Bilateral;  . IMAGE GUIDED SINUS SURGERY N/A 12/22/2016   Procedure: IMAGE GUIDED SINUS SURGERY;  Surgeon: Margaretha Sheffield, MD;  Location: [place];  Service: ENT;  Laterality: N/A;  . LAPAROSCOPIC TUBAL LIGATION Bilateral 05/10/2019   Procedure: LAPAROSCOPIC TUBAL LIGATION, falope rings Right fallope ring  Left tubal cautery;  Surgeon: Schermerhorn, Gwen Her, MD;  Location: [facility];  Service: Gynecology;  Laterality: Bilateral;  . MAXILLARY ANTROSTOMY Bilateral 12/22/2016   Procedure: MAXILLARY ANTROSTOMY;  Surgeon: Margaretha Sheffield, MD;  Location: [place];  Service: ENT;  Laterality: Bilateral;  . PARTIAL MASTECTOMY WITH NEEDLE LOCALIZATION Left 06/22/2018   Procedure: PARTIAL MASTECTOMY WITH NEEDLE LOCALIZATION;  Surgeon: Herbert Pun, MD;  Location: [facility];  Service: General;  Laterality: Left;  . SENTINEL NODE BIOPSY Left 06/22/2018   Procedure: SENTINEL NODE BIOPSY;  Surgeon: Herbert Pun, MD;  Location: [facility];  Service: General;  Laterality: Left;  . SEPTOPLASTY N/A 12/22/2016   Procedure: SEPTOPLASTY;  Surgeon: Margaretha Sheffield, MD;   Location: [place];  Service: ENT;  Laterality: N/A;  GAVE DISK TO CECE 4-5 KP  . TURBINATE REDUCTION N/A 12/22/2016   Procedure: TURBINATE REDUCTION partial inferior;  Surgeon: Margaretha Sheffield, MD;  Location: [place];  Service: ENT;  Laterality: N/A;    Social History   Socioeconomic History  . Marital status: Married    Spouse name: Not on file  . Number of children: Not on file  . Years of education: Not on file  . Highest education level: Not on file  Occupational History  . Not on file  Tobacco Use  . Smoking status: Former Smoker    Packs/day: 1.00    Years: 15.00    Pack years: 15.00    Types: Cigarettes    Quit date: 2013    Years since quitting: 8.7  . Smokeless tobacco: Never Used  Vaping Use  . Vaping Use: Former  Substance and Sexual Activity  . Alcohol use: Yes    Alcohol/week: 2.0 standard drinks    Types: 1 Glasses of wine, 1 Cans of beer per week    Comment: social (4-6 drinks/month)  . Drug use: No  . Sexual activity: Yes  Other Topics Concern  . Not on file  Social History Narrative  . Not on file   Social Determinants of Health   Financial Resource Strain:   . Difficulty of Paying Living Expenses: Not on file  Food Insecurity:   .  Worried About Charity fundraiser in the Last Year: Not on file  . Ran Out of Food in the Last Year: Not on file  Transportation Needs:   . Lack of Transportation (Medical): Not on file  . Lack of Transportation (Non-Medical): Not on file  Physical Activity:   . Days of Exercise per Week: Not on file  . Minutes of Exercise per Session: Not on file  Stress:   . Feeling of Stress : Not on file  Social Connections:   . Frequency of Communication with Friends and Family: Not on file  . Frequency of Social Gatherings with Friends and Family: Not on file  . Attends Religious Services: Not on file  . Active Member of Clubs or Organizations: Not on file  . Attends Archivist Meetings: Not on  file  . Marital Status: Not on file  Intimate Partner Violence:   . Fear of Current or Ex-Partner: Not on file  . Emotionally Abused: Not on file  . Physically Abused: Not on file  . Sexually Abused: Not on file    Family History  Problem Relation Age of Onset  . Breast cancer Mother        dx 39s; 2nd primary at 46  . Breast cancer Paternal Grandmother        dx 47s; deceased 75s  . Breast cancer Paternal Aunt        dx 42s; currently 33s  . Breast cancer Cousin        dx 34s; currently late 48s; daughter of a paternal uncle  . Lung cancer Paternal Uncle        smoker; deceased     Current Outpatient Medications:  .  ALPRAZolam (XANAX) 0.25 MG tablet, Take 1 tablet (0.25 mg total) by mouth daily as needed for anxiety., Disp: 30 tablet, Rfl: 2 .  calcium-vitamin D (OSCAL WITH D) 500-200 MG-UNIT tablet, Take 1 tablet by mouth., Disp: , Rfl:  .  hydrochlorothiazide (MICROZIDE) 12.5 MG capsule, Take 12.5 mg by mouth daily. , Disp: , Rfl:  .  levocetirizine (XYZAL) 5 MG tablet, Take 5 mg by mouth daily. , Disp: , Rfl:  .  naproxen sodium (ALEVE) 220 MG tablet, Take 220-440 mg by mouth 2 (two) times daily as needed (pain.)., Disp: , Rfl:  .  NON FORMULARY, Place 3 drops under the tongue at bedtime. Allergy Oral Drops made by [facility] , Disp: , Rfl:  .  omeprazole (PRILOSEC OTC) 20 MG tablet, Take 40 mg by mouth daily before breakfast. , Disp: , Rfl:  .  sertraline (ZOLOFT) 50 MG tablet, Take 1 tablet (50 mg total) by mouth daily. Initiate one half tablet a day for 8 days then 1 a day, Disp: 90 tablet, Rfl: 1 .  tamoxifen (NOLVADEX) 20 MG tablet, TAKE 1 TABLET BY MOUTH EVERY DAY, Disp: 90 tablet, Rfl: 1 .  traMADol (ULTRAM) 50 MG tablet, Take 50 mg by mouth every 6 (six) hours as needed (pain)., Disp: , Rfl:  .  triamcinolone (NASACORT ALLERGY 24HR) 55 MCG/ACT AERO nasal inhaler, Place 1 spray into the nose daily., Disp: , Rfl:  .  vitamin B-12 (CYANOCOBALAMIN) 1000 MCG tablet,  Take 1,000 mcg by mouth daily., Disp: , Rfl:  .  traZODone (DESYREL) 50 MG tablet, TAKE 0.5-1 TABLETS (25-50 MG TOTAL) BY MOUTH AT BEDTIME AS NEEDED FOR SLEEP. (Patient not taking: Reported on 05/15/2020), Disp: 90 tablet, Rfl: 1  Physical exam:  Vitals:   05/15/20  1429  BP: 139/73  Pulse: 88  Resp: 16  Temp: 97.8 F (36.6 C)  TempSrc: Tympanic  SpO2: 100%  Weight: 198 lb 11.2 oz (90.1 kg)   Physical Exam Constitutional:      General: She is not in acute distress. Neck:     Comments: No palpable cervical adenopathy.  There is prominent left supraclavicular pad of fat which appears more than the right side without any frank palpable mass Cardiovascular:     Rate and Rhythm: Normal rate and regular rhythm.     Heart sounds: Normal heart sounds.  Pulmonary:     Effort: Pulmonary effort is normal.     Breath sounds: Normal breath sounds.  Abdominal:     General: Bowel sounds are normal.     Palpations: Abdomen is soft.  Musculoskeletal:     Cervical back: Normal range of motion.  Skin:    General: Skin is warm and dry.  Neurological:     Mental Status: She is alert and oriented to person, place, and time.      CMP Latest Ref Rng & Units 02/03/2020  Glucose 70 - 99 mg/dL 102(H)  BUN 6 - 20 mg/dL 11  Creatinine 0.44 - 1.00 mg/dL 0.79  Sodium 135 - 145 mmol/L 141  Potassium 3.5 - 5.1 mmol/L 4.1  Chloride 98 - 111 mmol/L 106  CO2 22 - 32 mmol/L 25  Calcium 8.9 - 10.3 mg/dL 8.5(L)  Total Protein 6.5 - 8.1 g/dL 7.2  Total Bilirubin 0.3 - 1.2 mg/dL 0.5  Alkaline Phos 38 - 126 U/L 76  AST 15 - 41 U/L 25  ALT 0 - 44 U/L 21   CBC Latest Ref Rng & Units 05/07/2019  WBC 4.0 - 10.5 K/uL 6.3  Hemoglobin 12.0 - 15.0 g/dL 14.8  Hematocrit 36 - 46 % 44.5  Platelets 150 - 400 K/uL 301     Assessment and plan- Patient is a 49 y.o. 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 Final pathology showed stage I apT1 apN0 cM0 invasive mammary carcinoma grade 2 ER/PR positive HER-2/neu negative  s/p lumpectomy and adjuvant radiation treatment. She did not require adjuvant chemotherapy. She is currently on tamoxifen.   This is an acute visit for neck swelling  Clinically I do not palpate any neck mass.  Left supraclavicular pad of fat appears more prominent than the right.  I will obtain an ultrasound of the neck at this time.  We will call the patient with the results.  She will keep her appointment with me in November as scheduled   Visit Diagnosis 1. Localized swelling, mass or lump of neck      Dr. Randa Evens, MD, MPH [facility] at [facility] [phone] 05/19/2020 1:09 PM

## 2020-05-20 ENCOUNTER — Ambulatory Visit
Admission: RE | Admit: 2020-05-20 | Discharge: 2020-05-20 | Disposition: A | Discharge status: Home / Self Care | Payer: Managed Care, Other (non HMO) | Source: Ambulatory Visit | Attending: Oncology | Admitting: Oncology

## 2020-05-20 ENCOUNTER — Other Ambulatory Visit: Payer: Self-pay

## 2020-05-20 DIAGNOSIS — R221 Localized swelling, mass and lump, neck: Secondary | ICD-10-CM | POA: Diagnosis present

## 2020-05-21 ENCOUNTER — Encounter: Payer: Self-pay | Admitting: *Deleted

## 2020-05-27 ENCOUNTER — Other Ambulatory Visit: Payer: Self-pay | Admitting: Oncology

## 2020-06-05 ENCOUNTER — Other Ambulatory Visit: Payer: Self-pay

## 2020-06-05 ENCOUNTER — Ambulatory Visit
Admission: RE | Admit: 2020-06-05 | Discharge: 2020-06-05 | Disposition: A | Discharge status: Home / Self Care | Payer: Managed Care, Other (non HMO) | Source: Ambulatory Visit | Attending: General Surgery | Admitting: General Surgery

## 2020-06-05 DIAGNOSIS — Z853 Personal history of malignant neoplasm of breast: Secondary | ICD-10-CM | POA: Insufficient documentation

## 2020-07-15 ENCOUNTER — Encounter: Payer: Self-pay | Admitting: Family Medicine

## 2020-07-15 ENCOUNTER — Other Ambulatory Visit: Payer: Self-pay

## 2020-07-15 ENCOUNTER — Ambulatory Visit: Payer: Managed Care, Other (non HMO) | Admitting: Family Medicine

## 2020-07-15 VITALS — BP 120/80 | HR 80 | Ht 65.0 in | Wt 192.0 lb

## 2020-07-15 DIAGNOSIS — R601 Generalized edema: Secondary | ICD-10-CM | POA: Diagnosis not present

## 2020-07-15 DIAGNOSIS — F329 Major depressive disorder, single episode, unspecified: Secondary | ICD-10-CM

## 2020-07-15 DIAGNOSIS — F5101 Primary insomnia: Secondary | ICD-10-CM | POA: Diagnosis not present

## 2020-07-15 DIAGNOSIS — E7801 Familial hypercholesterolemia: Secondary | ICD-10-CM

## 2020-07-15 DIAGNOSIS — F419 Anxiety disorder, unspecified: Secondary | ICD-10-CM

## 2020-07-15 DIAGNOSIS — K219 Gastro-esophageal reflux disease without esophagitis: Secondary | ICD-10-CM

## 2020-07-15 MED ORDER — SERTRALINE HCL 50 MG PO TABS: 50.0000 mg | ORAL_TABLET | Freq: Every day | ORAL | 1 refills | Status: DC

## 2020-07-15 MED ORDER — HYDROCHLOROTHIAZIDE 12.5 MG PO CAPS: 12.5000 mg | ORAL_CAPSULE | Freq: Every day | ORAL | 1 refills | Status: DC

## 2020-07-15 MED ORDER — ALPRAZOLAM 0.25 MG PO TABS: 0.2500 mg | ORAL_TABLET | Freq: Every day | ORAL | 5 refills | Status: DC | PRN

## 2020-07-15 MED ORDER — TRAZODONE HCL 50 MG PO TABS: 25.0000 mg | ORAL_TABLET | Freq: Every evening | ORAL | 1 refills | Status: DC | PRN

## 2020-07-15 MED ORDER — OMEPRAZOLE MAGNESIUM 20 MG PO TBEC: 40.0000 mg | DELAYED_RELEASE_TABLET | Freq: Every day | ORAL | 1 refills | Status: DC

## 2020-07-15 NOTE — Progress Notes (Signed)
Date:  07/15/2020   Name:  Tiffany Humphrey   DOB:  [birth year]   MRN:  [id number]   Chief Complaint: Depression, anxiety, Insomnia, and Edema  Depression        This is a chronic problem.  The current episode started more than 1 year ago.   The onset quality is gradual.   The problem occurs intermittently.  The problem has been gradually improving since onset.  Associated symptoms include insomnia.  Associated symptoms include no decreased concentration, no fatigue, no helplessness, no hopelessness, not irritable, no restlessness, no decreased interest, no appetite change, no body aches, no myalgias, no headaches, no indigestion, not sad and no suicidal ideas.     The symptoms are aggravated by family issues.  Past treatments include SSRIs - Selective serotonin reuptake inhibitors.  Compliance with treatment is good.  Previous treatment provided moderate relief.   Pertinent negatives include no hypothyroidism and no anxiety. Insomnia Primary symptoms: no fragmented sleep, no sleep disturbance, no difficulty falling asleep, no somnolence, no frequent awakening, no malaise/fatigue, no napping.  The problem has been gradually improving since onset. The symptoms are aggravated by anxiety and family issues. PMH includes: depression.  Anxiety Presents for follow-up visit. Symptoms include insomnia and nervous/anxious behavior. Patient reports no chest pain, decreased concentration, dizziness, nausea, palpitations, restlessness, shortness of breath or suicidal ideas. The severity of symptoms is moderate.    Hyperlipidemia The problem is controlled. Recent lipid tests were reviewed and are normal. She has no history of diabetes or hypothyroidism. Pertinent negatives include no chest pain, focal sensory loss, focal weakness, leg pain, myalgias or shortness of breath. Current antihyperlipidemic treatment includes diet change. Risk factors for coronary artery disease include dyslipidemia.  Gastroesophageal  Reflux She reports no abdominal pain, no chest pain, no choking, no coughing, no heartburn, no nausea, no sore throat or no wheezing. This is a chronic problem. The current episode started more than 1 year ago. The symptoms are aggravated by certain foods. Pertinent negatives include no fatigue. She has tried a PPI for the symptoms.    Lab Results  Component Value Date   CREATININE 0.79 02/03/2020   BUN 11 02/03/2020   NA 141 02/03/2020   K 4.1 02/03/2020   CL 106 02/03/2020   CO2 25 02/03/2020   No results found for: CHOL, HDL, LDLCALC, LDLDIRECT, TRIG, CHOLHDL Lab Results  Component Value Date   TSH 1.730 07/01/2019   No results found for: HGBA1C Lab Results  Component Value Date   WBC 6.3 05/07/2019   HGB 14.8 05/07/2019   HCT 44.5 05/07/2019   MCV 87.8 05/07/2019   PLT 301 05/07/2019   Lab Results  Component Value Date   ALT 21 02/03/2020   AST 25 02/03/2020   ALKPHOS 76 02/03/2020   BILITOT 0.5 02/03/2020     Review of Systems  Constitutional: Negative.  Negative for appetite change, chills, fatigue, fever, malaise/fatigue and unexpected weight change.  HENT: Negative for congestion, ear discharge, ear pain, rhinorrhea, sinus pressure, sinus pain, sneezing and sore throat.   Eyes: Negative for photophobia, pain, discharge, redness and itching.  Respiratory: Negative for cough, choking, shortness of breath, wheezing and stridor.   Cardiovascular: Negative for chest pain, palpitations and leg swelling.  Gastrointestinal: Negative for abdominal pain, blood in stool, constipation, diarrhea, heartburn, nausea and vomiting.  Endocrine: Negative for cold intolerance, heat intolerance, polydipsia, polyphagia and polyuria.  Genitourinary: Negative for dysuria, flank pain, frequency, hematuria, menstrual problem, pelvic pain,  urgency, vaginal bleeding and vaginal discharge.  Musculoskeletal: Negative for arthralgias, back pain and myalgias.  Skin: Negative for rash.    Allergic/Immunologic: Negative for environmental allergies and food allergies.  Neurological: Negative for dizziness, focal weakness, weakness, light-headedness, numbness and headaches.  Hematological: Negative for adenopathy. Does not bruise/bleed easily.  Psychiatric/Behavioral: Positive for depression. Negative for decreased concentration, dysphoric mood, sleep disturbance and suicidal ideas. The patient is nervous/anxious and has insomnia.     Patient Active Problem List   Diagnosis Date Noted  . 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 Genetic testing 01/14/2019  . Malignant neoplasm of upper-outer quadrant of left breast in female, estrogen receptor positive ([place]) 07/06/2018  . Breast cancer ([place] [address]) 06/18/2018  . Migraines 08/03/2015    Allergies  Allergen Reactions  . Quinolones Hives  . Clindamycin Hives  . Codeine Other (See Comments) and Palpitations    tachycardia    Past Surgical History:  Procedure Laterality Date  . BREAST BIOPSY Right 06/08/2016   CYSTIC PAPILLARY APOCRINE METAPLASIA.   Marland Kitchen BREAST BIOPSY Left 06/06/2018   left breast stereo X clip positive  . BREAST EXCISIONAL BIOPSY Left 06/22/2018   lumpectomy with sn and nl  . BREAST LUMPECTOMY Left 06/22/2018  . CYSTOSTOMY Right 05/10/2019   Procedure: RIGHT OVARIAN CYSTOSTOMY;  Surgeon: Schermerhorn, Gwen Her, MD;  Location: [facility];  Service: Gynecology;  Laterality: Right;  . FRONTAL SINUS EXPLORATION Bilateral 12/22/2016   Procedure: FRONTAL SINUS EXPLORATION;  Surgeon: Margaretha Sheffield, MD;  Location: [place];  Service: ENT;  Laterality: Bilateral;  . IMAGE GUIDED SINUS SURGERY N/A 12/22/2016   Procedure: IMAGE GUIDED SINUS SURGERY;  Surgeon: Margaretha Sheffield, MD;  Location: [place];  Service: ENT;  Laterality: N/A;  . LAPAROSCOPIC TUBAL LIGATION Bilateral 05/10/2019   Procedure: LAPAROSCOPIC TUBAL LIGATION, falope rings Right fallope ring  Left tubal cautery;  Surgeon: Schermerhorn, Gwen Her, MD;   Location: [facility];  Service: Gynecology;  Laterality: Bilateral;  . MAXILLARY ANTROSTOMY Bilateral 12/22/2016   Procedure: MAXILLARY ANTROSTOMY;  Surgeon: Margaretha Sheffield, MD;  Location: [place];  Service: ENT;  Laterality: Bilateral;  . PARTIAL MASTECTOMY WITH NEEDLE LOCALIZATION Left 06/22/2018   Procedure: PARTIAL MASTECTOMY WITH NEEDLE LOCALIZATION;  Surgeon: Herbert Pun, MD;  Location: [facility];  Service: General;  Laterality: Left;  . SENTINEL NODE BIOPSY Left 06/22/2018   Procedure: SENTINEL NODE BIOPSY;  Surgeon: Herbert Pun, MD;  Location: [facility];  Service: General;  Laterality: Left;  . SEPTOPLASTY N/A 12/22/2016   Procedure: SEPTOPLASTY;  Surgeon: Margaretha Sheffield, MD;  Location: [place];  Service: ENT;  Laterality: N/A;  GAVE DISK TO [facility] 4-5 KP  . TURBINATE REDUCTION N/A 12/22/2016   Procedure: TURBINATE REDUCTION partial inferior;  Surgeon: Margaretha Sheffield, MD;  Location: [place];  Service: ENT;  Laterality: N/A;    Social History   Tobacco Use  . Smoking status: Former Smoker    Packs/day: 1.00    Years: 15.00    Pack years: 15.00    Types: Cigarettes    Quit date: 2013    Years since quitting: 8.8  . Smokeless tobacco: Never Used  Vaping Use  . Vaping Use: Former  Substance Use Topics  . Alcohol use: Yes    Alcohol/week: 2.0 standard drinks    Types: 1 Glasses of wine, 1 Cans of beer per week    Comment: social (4-6 drinks/month)  . Drug use: No     Medication list has been reviewed and updated.  Current Meds  Medication Sig  . ALPRAZolam (XANAX) 0.25 MG tablet Take 1 tablet (0.25 mg total) by mouth daily as needed for anxiety.  . calcium-vitamin D (OSCAL WITH D) 500-200 MG-UNIT tablet Take 1 tablet by mouth.  . hydrochlorothiazide (MICROZIDE) 12.5 MG capsule Take 12.5 mg by mouth daily.   Marland Kitchen levocetirizine (XYZAL) 5 MG tablet Take 5 mg by mouth daily.   . naproxen sodium (ALEVE) 220 MG tablet Take 220-440 mg by  mouth 2 (two) times daily as needed (pain.).  Marland Kitchen NON FORMULARY Place 3 drops under the tongue at bedtime. Allergy Oral Drops [facility] by [facility]   . omeprazole (PRILOSEC OTC) 20 MG tablet Take 40 mg by mouth daily before breakfast.   . sertraline (ZOLOFT) 50 MG tablet Take 1 tablet (50 mg total) by mouth daily. Initiate one half tablet a day for 8 days then 1 a day  . tamoxifen (NOLVADEX) 20 MG tablet TAKE 1 TABLET BY MOUTH EVERY DAY  . traMADol (ULTRAM) 50 MG tablet Take 50 mg by mouth every 6 (six) hours as needed (pain).  . traZODone (DESYREL) 50 MG tablet TAKE 0.5-1 TABLETS (25-50 MG TOTAL) BY MOUTH AT BEDTIME AS NEEDED FOR SLEEP.  Marland Kitchen triamcinolone (NASACORT ALLERGY 24HR) 55 MCG/ACT AERO nasal inhaler Place 1 spray into the nose daily.  . vitamin B-12 (CYANOCOBALAMIN) 1000 MCG tablet Take 1,000 mcg by mouth daily.    PHQ 2/9 Scores 07/15/2020 01/31/2020 08/15/2019 07/01/2019  PHQ - 2 Score 0 0 0 3  PHQ- 9 Score 2 6 0 13    GAD 7 : Generalized Anxiety Score 07/15/2020 01/31/2020 08/15/2019 07/01/2019  Nervous, Anxious, on Edge 0 0 0 3  Control/stop worrying 0 0 0 1  Worry too much - different things 0 0 0 3  Trouble relaxing 1 0 0 3  Restless 0 0 0 0  Easily annoyed or irritable 2 0 0 3  Afraid - awful might happen 0 0 0 0  Total GAD 7 Score 3 0 0 13  Anxiety Difficulty Not difficult at all Not difficult at all - Very difficult    BP Readings from Last 3 Encounters:  07/15/20 120/80  05/15/20 139/73  01/31/20 118/82    Physical Exam Vitals and nursing note reviewed.  Constitutional:      General: She is not irritable.    Appearance: She is well-developed.  HENT:     Head: Normocephalic.     Right Ear: Tympanic membrane, ear canal and external ear normal. There is no impacted cerumen.     Left Ear: Tympanic membrane, ear canal and external ear normal. There is no impacted cerumen.     Nose: Nose normal.     Mouth/Throat:     Mouth: Mucous membranes are moist.  Eyes:      General: Lids are everted, no foreign bodies appreciated. No scleral icterus.       Left eye: No foreign body or hordeolum.     Conjunctiva/sclera: Conjunctivae normal.     Right eye: Right conjunctiva is not injected.     Left eye: Left conjunctiva is not injected.     Pupils: Pupils are equal, round, and reactive to light.  Neck:     Thyroid: No thyromegaly.     Vascular: No JVD.     Trachea: No tracheal deviation.  Cardiovascular:     Rate and Rhythm: Normal rate and regular rhythm.     Heart sounds: Normal heart sounds. No murmur heard.  No friction rub. No gallop.   Pulmonary:     Effort: Pulmonary effort is normal. No respiratory distress.     Breath sounds: Normal breath sounds. No wheezing, rhonchi or rales.  Abdominal:     General: Bowel sounds are normal.     Palpations: Abdomen is soft. There is no mass.     Tenderness: There is no abdominal tenderness. There is no guarding or rebound.  Musculoskeletal:        General: No tenderness. Normal range of motion.     Cervical back: Normal range of motion and neck supple.  Lymphadenopathy:     Cervical: No cervical adenopathy.  Skin:    General: Skin is warm.     Findings: No rash.  Neurological:     Mental Status: She is alert and oriented to person, place, and time.     Cranial Nerves: No cranial nerve deficit.     Deep Tendon Reflexes: Reflexes normal.  Psychiatric:        Mood and Affect: Mood is not anxious or depressed.     Wt Readings from Last 3 Encounters:  07/15/20 192 lb (87.1 kg)  05/15/20 198 lb 11.2 oz (90.1 kg)  01/31/20 192 lb (87.1 kg)    BP 120/80   Pulse 80   Ht 5\' 5"  (1.651 m)   Wt 192 lb (87.1 kg)   BMI 31.95 kg/m   Assessment and Plan:                                                    Patient's chart was reviewed for previous encounters and most recent labs. 1. Reactive depression Chronic.  Controlled.  Stable.  PHQ 2.  We will continue patient's sertraline 50 mg 1 a day.  Will recheck  in 6 months - ALPRAZolam (XANAX) 0.25 MG tablet; Take 1 tablet (0.25 mg total) by mouth daily as needed for anxiety.  Dispense: 30 tablet; Refill: 5 - sertraline (ZOLOFT) 50 MG tablet; Take 1 tablet (50 mg total) by mouth daily. Initiate one half tablet a day for 8 days then 1 a day  Dispense: 90 tablet; Refill: 1  2. Anxiety Chronic.  Controlled.  Stable.  Gad score is 2 will continue alprazolam 0.25 mg as needed for panic attacks and sertraline 50 mg 1 tablet 50 mg once a day. - ALPRAZolam (XANAX) 0.25 MG tablet; Take 1 tablet (0.25 mg total) by mouth daily as needed for anxiety.  Dispense: 30 tablet; Refill: 5 - sertraline (ZOLOFT) 50 MG tablet; Take 1 tablet (50 mg total) by mouth daily. Initiate one half tablet a day for 8 days then 1 a day  Dispense: 90 tablet; Refill: 1  3. Primary insomnia Chronic.  Controlled.  Stable.  Continue trazodone 50 mg 1/2-1 at bedtime. - traZODone (DESYREL) 50 MG tablet; Take 0.5-1 tablets (25-50 mg total) by mouth at bedtime as needed for sleep.  Dispense: 90 tablet; Refill: 1  4. Generalized edema Chronic.  Controlled.  Stable.  Patient's exam noticed decrease in edema with no pretibial edema noted.  We will continue hydrochlorothiazide 12.5 mg daily. - hydrochlorothiazide (MICROZIDE) 12.5 MG capsule; Take 1 capsule (12.5 mg total) by mouth daily.  Dispense: 90 capsule; Refill: 1  5. Familial hypercholesterolemia Chronic.  Controlled.  Stable.  We will continue with diet control of hypercholesterol  we will check lipid panel and fasting state today. - Lipid Panel With LDL/HDL Ratio  6. Gastroesophageal reflux disease without esophagitis Chronic.  Controlled.  Stable.  Continue omeprazole 20 mg 2 tablets daily before breakfast. - omeprazole (PRILOSEC OTC) 20 MG tablet; Take 2 tablets (40 mg total) by mouth daily before breakfast.  Dispense: 90 tablet; Refill: 1

## 2020-07-16 ENCOUNTER — Other Ambulatory Visit: Payer: Self-pay

## 2020-07-16 DIAGNOSIS — E7801 Familial hypercholesterolemia: Secondary | ICD-10-CM

## 2020-07-16 LAB — LIPID PANEL WITH LDL/HDL RATIO
Cholesterol, Total: 216 mg/dL — ABNORMAL HIGH (ref 100–199)
HDL: 46 mg/dL (ref >39)
LDL Chol Calc (NIH): 144 mg/dL — ABNORMAL HIGH (ref 0–99)
LDL/HDL Ratio: 3.1 ratio (ref 0.0–3.2)
Triglycerides: 146 mg/dL (ref 0–149)
VLDL Cholesterol Cal: 26 mg/dL (ref 5–40)

## 2020-07-16 MED ORDER — EZETIMIBE 10 MG PO TABS: 10.0000 mg | ORAL_TABLET | Freq: Every day | ORAL | 1 refills | Status: DC

## 2020-07-16 NOTE — Progress Notes (Unsigned)
Sent in [place]

## 2020-08-03 LAB — HM PAP SMEAR: HM Pap smear: NORMAL

## 2020-08-09 ENCOUNTER — Encounter: Payer: Self-pay | Admitting: Oncology

## 2020-08-12 ENCOUNTER — Other Ambulatory Visit: Payer: Self-pay | Admitting: Family Medicine

## 2020-08-12 DIAGNOSIS — E7801 Familial hypercholesterolemia: Secondary | ICD-10-CM

## 2020-08-12 NOTE — Telephone Encounter (Signed)
Labs due- no order is in chart- RF with note to contact office

## 2020-08-13 ENCOUNTER — Ambulatory Visit: Payer: Managed Care, Other (non HMO) | Admitting: Oncology

## 2020-08-13 ENCOUNTER — Other Ambulatory Visit: Payer: Managed Care, Other (non HMO)

## 2020-08-24 ENCOUNTER — Inpatient Hospital Stay: Payer: Managed Care, Other (non HMO) | Admitting: Oncology

## 2020-08-24 ENCOUNTER — Other Ambulatory Visit: Payer: Self-pay | Admitting: *Deleted

## 2020-08-24 ENCOUNTER — Inpatient Hospital Stay: Payer: Managed Care, Other (non HMO) | Attending: Oncology

## 2020-08-24 ENCOUNTER — Encounter: Payer: Self-pay | Admitting: *Deleted

## 2020-08-24 ENCOUNTER — Encounter: Payer: Self-pay | Admitting: Oncology

## 2020-08-24 VITALS — BP 119/82 | HR 84 | Temp 98.3°F | Resp 20 | Wt 191.3 lb

## 2020-08-24 DIAGNOSIS — E876 Hypokalemia: Secondary | ICD-10-CM

## 2020-08-24 DIAGNOSIS — D0512 Intraductal carcinoma in situ of left breast: Secondary | ICD-10-CM | POA: Diagnosis present

## 2020-08-24 DIAGNOSIS — Z923 Personal history of irradiation: Secondary | ICD-10-CM | POA: Diagnosis not present

## 2020-08-24 DIAGNOSIS — Z170 Estrogen receptor positive status [ER+]: Secondary | ICD-10-CM | POA: Diagnosis not present

## 2020-08-24 DIAGNOSIS — Z79810 Long term (current) use of selective estrogen receptor modulators (SERMs): Secondary | ICD-10-CM | POA: Diagnosis not present

## 2020-08-24 DIAGNOSIS — Z08 Encounter for follow-up examination after completed treatment for malignant neoplasm: Secondary | ICD-10-CM

## 2020-08-24 DIAGNOSIS — Z853 Personal history of malignant neoplasm of breast: Secondary | ICD-10-CM | POA: Diagnosis not present

## 2020-08-24 LAB — COMPREHENSIVE METABOLIC PANEL
ALT: 28 U/L (ref 0–44)
AST: 29 U/L (ref 15–41)
Albumin: 3.8 g/dL (ref 3.5–5.0)
Alkaline Phosphatase: 52 U/L (ref 38–126)
Anion gap: 10 (ref 5–15)
BUN: 9 mg/dL (ref 6–20)
CO2: 26 mmol/L (ref 22–32)
Calcium: 8.5 mg/dL — ABNORMAL LOW (ref 8.9–10.3)
Chloride: 102 mmol/L (ref 98–111)
Creatinine, Ser: 0.79 mg/dL (ref 0.44–1.00)
GFR, Estimated: >60 mL/min (ref >60)
Glucose, Bld: 113 mg/dL — ABNORMAL HIGH (ref 70–99)
Potassium: 3.1 mmol/L — ABNORMAL LOW (ref 3.5–5.1)
Sodium: 138 mmol/L (ref 135–145)
Total Bilirubin: 0.6 mg/dL (ref 0.3–1.2)
Total Protein: 7.3 g/dL (ref 6.5–8.1)

## 2020-08-24 LAB — CBC WITH DIFFERENTIAL/PLATELET
Abs Immature Granulocytes: 0.04 10*3/uL (ref 0.00–0.07)
Basophils Absolute: 0 10*3/uL (ref 0.0–0.1)
Basophils Relative: 0 %
Eosinophils Absolute: 0.1 10*3/uL (ref 0.0–0.5)
Eosinophils Relative: 1 %
HCT: 41.6 % (ref 36.0–46.0)
Hemoglobin: 14.3 g/dL (ref 12.0–15.0)
Immature Granulocytes: 0 %
Lymphocytes Relative: 27 %
Lymphs Abs: 2.7 10*3/uL (ref 0.7–4.0)
MCH: 29.9 pg (ref 26.0–34.0)
MCHC: 34.4 g/dL (ref 30.0–36.0)
MCV: 86.8 fL (ref 80.0–100.0)
Monocytes Absolute: 0.6 10*3/uL (ref 0.1–1.0)
Monocytes Relative: 6 %
Neutro Abs: 6.6 10*3/uL (ref 1.7–7.7)
Neutrophils Relative %: 66 %
Platelets: 318 10*3/uL (ref 150–400)
RBC: 4.79 MIL/uL (ref 3.87–5.11)
RDW: 13.2 % (ref 11.5–15.5)
WBC: 10 10*3/uL (ref 4.0–10.5)
nRBC: 0 % (ref 0.0–0.2)

## 2020-08-24 MED ORDER — POTASSIUM CHLORIDE CRYS ER 20 MEQ PO TBCR: 20.0000 meq | EXTENDED_RELEASE_TABLET | Freq: Every day | ORAL | 0 refills | Status: DC

## 2020-08-24 NOTE — Progress Notes (Signed)
Met b 

## 2020-08-24 NOTE — Progress Notes (Signed)
Called pt and let her know that her potassium was low at 3.1 and dr Smith Robert wanted her to have potassium once a day for 7 days. Pt is agreeable to the plan. She asked about if she would need to come back and have it rechecked. I told her that I would ask rao and send hear my chart message.

## 2020-08-24 NOTE — Progress Notes (Signed)
Hematology/Oncology Consult note [facility]  [phone] [phone]  Patient Care Team: Juline Patch, MD as PCP - General (Family Medicine)   Name of the patient: Tiffany Humphrey  [id number]  1971-07-27   Date of visit: 08/24/20  Diagnosis- history of stage I left breast cancer  Chief complaint/ Reason for visit-routine follow-up of breast cancer  Heme/Onc history: patient is a 49- year-old female whose mother was recently diagnosed with DCIS. She recently underwent a screening mammogram which showed abnormal calcifications in her left breast. Diagnostic mammogram and ultrasound confirmed 9 x 5 x 5 mm calcifications. 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 There was a single focus worrisome but not diagnostic for microinvasion. Patient has met with surgery and is tentatively scheduled to undergo lumpectomy and sentinel lymph node biopsy next week. Patient states that she has used birth control for the last 25 years. She used to have regular. But about 5 months ago she did not have her menstrual cycles for about 5 months. At that point her birth control was temporarily heldand she did have her menstrual cycle after 5 months. She did restart her birth control at that point.She feels well today and denies any complaints. Her appetite is good and she denies any unintentional weight loss. She is G1P1L1.No other family history of breast cancer other than her mother had DCIS.  Patient had a biopsy on 06/22/2018. Final pathology showed invasive mammary carcinoma 5 mm grade 2 ER greater than 90% positive PR 51 to 90% positive and HER-2/neu negative with negative margins. Sentinel lymph node was negative for malignancy. This was associated with high-grade DCIS.  oncotype score came back at 16. Plan is to proceed with OS plus AI. However patient had significant headache with the first dose of Lupron to the point that  she could not function and headache persisted for almost a month.Ovarian suppression was discontinued and patient was switched to tamoxifen. Adjuvant radiation treatment completed in January 2020.   Interval history-patient is tolerating tamoxifen well without any significant side effects.  She did have a menstrual cycle in October after 8 months of not having 1.  Denies any new breast concerns at this time  ECOG PS- 0 Pain scale- 0   Review of systems- Review of Systems  Constitutional: Negative for chills, fever, malaise/fatigue and weight loss.  HENT: Negative for congestion, ear discharge and nosebleeds.   Eyes: Negative for blurred vision.  Respiratory: Negative for cough, hemoptysis, sputum production, shortness of breath and wheezing.   Cardiovascular: Negative for chest pain, palpitations, orthopnea and claudication.  Gastrointestinal: Negative for abdominal pain, blood in stool, constipation, diarrhea, heartburn, melena, nausea and vomiting.  Genitourinary: Negative for dysuria, flank pain, frequency, hematuria and urgency.  Musculoskeletal: Negative for back pain, joint pain and myalgias.  Skin: Negative for rash.  Neurological: Negative for dizziness, tingling, focal weakness, seizures, weakness and headaches.  Endo/Heme/Allergies: Does not bruise/bleed easily.  Psychiatric/Behavioral: Negative for depression and suicidal ideas. The patient does not have insomnia.      Allergies  Allergen Reactions  . Quinolones Hives  . Clindamycin Hives  . Codeine Other (See Comments) and Palpitations    tachycardia     Past Medical History:  Diagnosis Date  . Anxiety   . Breast cancer ([place]) 06/2018   left breast  . Cancer ([facility])    LEFT BREAST   . Complication of anesthesia    panic attacks  . GERD (gastroesophageal  reflux disease)   . Headache    migraines/stress, optical migraines  . History of kidney stones   . Hypertension   . Multilevel degenerative disc disease    . Personal history of radiation therapy   . PONV (postoperative nausea and vomiting)   . Vertigo    last episode 10/2016-NO PROBLEMS SINCE SINUS SURGERY IN 2018  . Wears contact lenses      Past Surgical History:  Procedure Laterality Date  . BREAST BIOPSY Right 06/08/2016   CYSTIC PAPILLARY APOCRINE METAPLASIA.   Marland Kitchen BREAST BIOPSY Left 06/06/2018   left breast stereo X clip positive  . BREAST EXCISIONAL BIOPSY Left 06/22/2018   lumpectomy with sn and nl  . BREAST LUMPECTOMY Left 06/22/2018  . CYSTOSTOMY Right 05/10/2019   Procedure: RIGHT OVARIAN CYSTOSTOMY;  Surgeon: Schermerhorn, Gwen Her, MD;  Location: [facility];  Service: Gynecology;  Laterality: Right;  . FRONTAL SINUS EXPLORATION Bilateral 12/22/2016   Procedure: FRONTAL SINUS EXPLORATION;  Surgeon: Margaretha Sheffield, MD;  Location: [place];  Service: ENT;  Laterality: Bilateral;  . IMAGE GUIDED SINUS SURGERY N/A 12/22/2016   Procedure: IMAGE GUIDED SINUS SURGERY;  Surgeon: Margaretha Sheffield, MD;  Location: [place];  Service: ENT;  Laterality: N/A;  . LAPAROSCOPIC TUBAL LIGATION Bilateral 05/10/2019   Procedure: LAPAROSCOPIC TUBAL LIGATION, falope rings Right fallope ring  Left tubal cautery;  Surgeon: Schermerhorn, Gwen Her, MD;  Location: [facility];  Service: Gynecology;  Laterality: Bilateral;  . MAXILLARY ANTROSTOMY Bilateral 12/22/2016   Procedure: MAXILLARY ANTROSTOMY;  Surgeon: Margaretha Sheffield, MD;  Location: [place];  Service: ENT;  Laterality: Bilateral;  . PARTIAL MASTECTOMY WITH NEEDLE LOCALIZATION Left 06/22/2018   Procedure: PARTIAL MASTECTOMY WITH NEEDLE LOCALIZATION;  Surgeon: Herbert Pun, MD;  Location: [facility];  Service: General;  Laterality: Left;  . SENTINEL NODE BIOPSY Left 06/22/2018   Procedure: SENTINEL NODE BIOPSY;  Surgeon: Herbert Pun, MD;  Location: [facility];  Service: General;  Laterality: Left;  . SEPTOPLASTY N/A 12/22/2016   Procedure: SEPTOPLASTY;  Surgeon:  Margaretha Sheffield, MD;  Location: [place];  Service: ENT;  Laterality: N/A;  GAVE DISK TO CECE 4-5 KP  . TURBINATE REDUCTION N/A 12/22/2016   Procedure: TURBINATE REDUCTION partial inferior;  Surgeon: Margaretha Sheffield, MD;  Location: [place];  Service: ENT;  Laterality: N/A;    Social History   Socioeconomic History  . Marital status: Married    Spouse name: Not on file  . Number of children: Not on file  . Years of education: Not on file  . Highest education level: Not on file  Occupational History  . Not on file  Tobacco Use  . Smoking status: Former Smoker    Packs/day: 1.00    Years: 15.00    Pack years: 15.00    Types: Cigarettes    Quit date: 2013    Years since quitting: 8.9  . Smokeless tobacco: Never Used  Vaping Use  . Vaping Use: Former  Substance and Sexual Activity  . Alcohol use: Yes    Alcohol/week: 2.0 standard drinks    Types: 1 Glasses of wine, 1 Cans of beer per week    Comment: social (4-6 drinks/month)  . Drug use: No  . Sexual activity: Yes  Other Topics Concern  . Not on file  Social History Narrative  . Not on file   Social Determinants of Health   Financial Resource Strain: Not on file  Food Insecurity: Not on file  Transportation Needs:  Not on file  Physical Activity: Not on file  Stress: Not on file  Social Connections: Not on file  Intimate Partner Violence: Not on file    Family History  Problem Relation Age of Onset  . Breast cancer Mother        dx 45s; 2nd primary at 68  . Breast cancer Paternal Grandmother        dx 29s; deceased 9s  . Breast cancer Paternal Aunt        dx 76s; currently 62s  . Breast cancer Cousin        dx 25s; currently late 82s; daughter of a paternal uncle  . Lung cancer Paternal Uncle        smoker; deceased     Current Outpatient Medications:  .  ALPRAZolam (XANAX) 0.25 MG tablet, Take 1 tablet (0.25 mg total) by mouth daily as needed for anxiety., Disp: 30 tablet, Rfl: 5 .   calcium-vitamin D (OSCAL WITH D) 500-200 MG-UNIT tablet, Take 1 tablet by mouth., Disp: , Rfl:  .  ezetimibe (ZETIA) 10 MG tablet, TAKE 1 TABLET BY MOUTH EVERY DAY, Disp: 30 tablet, Rfl: 0 .  hydrochlorothiazide (MICROZIDE) 12.5 MG capsule, Take 1 capsule (12.5 mg total) by mouth daily., Disp: 90 capsule, Rfl: 1 .  levocetirizine (XYZAL) 5 MG tablet, Take 5 mg by mouth daily. , Disp: , Rfl:  .  naproxen sodium (ALEVE) 220 MG tablet, Take 220-440 mg by mouth 2 (two) times daily as needed (pain.)., Disp: , Rfl:  .  NON FORMULARY, Place 3 drops under the tongue at bedtime. Allergy Oral Drops made by [facility], Disp: , Rfl:  .  omeprazole (PRILOSEC OTC) 20 MG tablet, Take 2 tablets (40 mg total) by mouth daily before breakfast., Disp: 90 tablet, Rfl: 1 .  sertraline (ZOLOFT) 50 MG tablet, Take 1 tablet (50 mg total) by mouth daily. Initiate one half tablet a day for 8 days then 1 a day, Disp: 90 tablet, Rfl: 1 .  tamoxifen (NOLVADEX) 20 MG tablet, TAKE 1 TABLET BY MOUTH EVERY DAY, Disp: 90 tablet, Rfl: 1 .  traMADol (ULTRAM) 50 MG tablet, Take 50 mg by mouth every 6 (six) hours as needed (pain)., Disp: , Rfl:  .  traZODone (DESYREL) 50 MG tablet, Take 0.5-1 tablets (25-50 mg total) by mouth at bedtime as needed for sleep., Disp: 90 tablet, Rfl: 1 .  triamcinolone (NASACORT) 55 MCG/ACT AERO nasal inhaler, Place 1 spray into the nose daily., Disp: , Rfl:  .  vitamin B-12 (CYANOCOBALAMIN) 1000 MCG tablet, Take 1,000 mcg by mouth daily., Disp: , Rfl:   Physical exam:  Vitals:   08/24/20 1133  BP: 119/82  Pulse: 84  Resp: 20  Temp: 98.3 F (36.8 C)  TempSrc: Tympanic  SpO2: 100%  Weight: 191 lb 4.8 oz (86.8 kg)   Physical Exam Constitutional:      General: She is not in acute distress. Eyes:     Extraocular Movements: EOM normal.  Cardiovascular:     Rate and Rhythm: Normal rate and regular rhythm.     Heart sounds: Normal heart sounds.  Pulmonary:     Effort: Pulmonary effort is  normal.     Breath sounds: Normal breath sounds.  Skin:    General: Skin is warm and dry.  Neurological:     Mental Status: She is alert and oriented to person, place, and time.      Breast exam was performed in seated and lying down  position. Patient is status post left lumpectomy with a well-healed surgical scar. No evidence of any palpable masses. No evidence of axillary adenopathy. No evidence of any palpable masses or lumps in the right breast. No evidence of right axillary adenopathy  CMP Latest Ref Rng & Units 08/24/2020  Glucose 70 - 99 mg/dL 113(H)  BUN 6 - 20 mg/dL 9  Creatinine 0.44 - 1.00 mg/dL 0.79  Sodium 135 - 145 mmol/L 138  Potassium 3.5 - 5.1 mmol/L 3.1(L)  Chloride 98 - 111 mmol/L 102  CO2 22 - 32 mmol/L 26  Calcium 8.9 - 10.3 mg/dL 8.5(L)  Total Protein 6.5 - 8.1 g/dL 7.3  Total Bilirubin 0.3 - 1.2 mg/dL 0.6  Alkaline Phos 38 - 126 U/L 52  AST 15 - 41 U/L 29  ALT 0 - 44 U/L 28   CBC Latest Ref Rng & Units 08/24/2020  WBC 4.0 - 10.5 K/uL 10.0  Hemoglobin 12.0 - 15.0 g/dL 14.3  Hematocrit 36.0 - 46.0 % 41.6  Platelets 150 - 400 K/uL 318      Assessment and plan- Patient is a 49 y.o. female  with ER positive DCIS on biopsy.Final pathology showed stage I apT1 apN0 cM0 invasive mammary carcinoma grade 2 ER/PR positive HER-2/neu negative s/p lumpectomy and adjuvant radiation treatment. She did not require adjuvant chemotherapy. She is currently on tamoxifen. This is a routine follow-up visit for breast cancer  Clinically patient is doing well with no concerning signs and symptoms of recurrence based on today's exam.  Mammogram from October 2021 was normal.She is tolerating tamoxifen well and will continue to take it for a total of 10 years given that she was premenopausal at diagnosis.  I will see her back in 6 months with no labs.  Potassium is 3.1 today and we will give her a prescription for 20 mEq of oral potassium for 1 week  Visit Diagnosis 1.  Encounter for follow-up surveillance of breast cancer   2. Hypokalemia      Dr. Randa Evens, MD, MPH [facility] [phone] 08/24/2020 3:35 PM

## 2020-09-03 ENCOUNTER — Inpatient Hospital Stay: Payer: Managed Care, Other (non HMO) | Attending: Oncology

## 2020-09-03 ENCOUNTER — Other Ambulatory Visit: Payer: Self-pay | Admitting: Family Medicine

## 2020-09-03 DIAGNOSIS — Z170 Estrogen receptor positive status [ER+]: Secondary | ICD-10-CM | POA: Insufficient documentation

## 2020-09-03 DIAGNOSIS — E7801 Familial hypercholesterolemia: Secondary | ICD-10-CM

## 2020-09-03 DIAGNOSIS — C50912 Malignant neoplasm of unspecified site of left female breast: Secondary | ICD-10-CM | POA: Insufficient documentation

## 2020-09-03 DIAGNOSIS — E876 Hypokalemia: Secondary | ICD-10-CM

## 2020-09-03 LAB — BASIC METABOLIC PANEL
Anion gap: 10 (ref 5–15)
BUN: 10 mg/dL (ref 6–20)
CO2: 23 mmol/L (ref 22–32)
Calcium: 8.8 mg/dL — ABNORMAL LOW (ref 8.9–10.3)
Chloride: 104 mmol/L (ref 98–111)
Creatinine, Ser: 0.8 mg/dL (ref 0.44–1.00)
GFR, Estimated: >60 mL/min (ref >60)
Glucose, Bld: 99 mg/dL (ref 70–99)
Potassium: 3.8 mmol/L (ref 3.5–5.1)
Sodium: 137 mmol/L (ref 135–145)

## 2020-11-18 ENCOUNTER — Other Ambulatory Visit: Payer: Self-pay | Admitting: Oncology

## 2020-12-31 ENCOUNTER — Other Ambulatory Visit: Payer: Self-pay

## 2020-12-31 ENCOUNTER — Ambulatory Visit (INDEPENDENT_AMBULATORY_CARE_PROVIDER_SITE_OTHER): Payer: Managed Care, Other (non HMO)

## 2020-12-31 ENCOUNTER — Ambulatory Visit
Admission: EM | Admit: 2020-12-31 | Discharge: 2020-12-31 | Disposition: A | Discharge status: Home / Self Care | Payer: Managed Care, Other (non HMO) | Attending: Sports Medicine | Admitting: Sports Medicine

## 2020-12-31 DIAGNOSIS — Z853 Personal history of malignant neoplasm of breast: Secondary | ICD-10-CM

## 2020-12-31 DIAGNOSIS — M25561 Pain in right knee: Secondary | ICD-10-CM

## 2020-12-31 DIAGNOSIS — M7121 Synovial cyst of popliteal space [Baker], right knee: Secondary | ICD-10-CM

## 2020-12-31 MED ORDER — MELOXICAM 7.5 MG PO TABS: 7.5000 mg | ORAL_TABLET | Freq: Every day | ORAL | 0 refills | Status: DC

## 2020-12-31 NOTE — ED Triage Notes (Signed)
Pt reports having R knee pain x3 months. sts she have shooting pain down leg when standing. sts last night while in bed she tried to straighten leg and it was painful. No known injury to knee.

## 2020-12-31 NOTE — Discharge Instructions (Signed)
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 Your x-rays do not show any acute findings.  If anything was to show up per radiology I will contact you. Please see educational handouts. I have also referred you to orthopedics.  Please call them to make an appointment. If symptoms persist before you get into orthopedics please see your primary care provider. I also sent in a prescription for meloxicam.  Please check with your cancer doctors to see if you are able to take this. If you are not able to take it then please just use Tylenol as needed. There is no indication for a brace as there is no instability. Please work on range of motion, ice it several times a day. If you lock up where you cannot move it and you are in extreme pain then please go to the ER.

## 2021-01-01 NOTE — ED Provider Notes (Signed)
[facility]    CSN: [id number] Arrival date & time: 12/31/20  0857      History   Chief Complaint Chief Complaint  Patient presents with  . Knee Pain    HPI Tiffany Humphrey is a 50 y.o. female.   Patient is a pleasant 50 year old female who presents for evaluation of the above issue.  Normally sees Dr. Otilio Miu for ongoing medical care but could not get an appointment.  She is self-employed and reports that she does not need a work note.  She reports right knee pain for about 3 months.  It is acutely worse yesterday and today.  Most of her pain is in the posterior aspect of her knee and she points to the popliteal fossa.  She reports that while she was in bed last night it was painful to move it.  No accidents trauma or falls.  She denies back pain or numbness or tingling.  She has never had a problem with his knee in the past.  No swelling bruising or redness.  She does report what sounds like an occasional catch but no locking or giving way.  It does not feel unstable.  No red flag signs or symptoms elicited on history.  I reviewed her chart in detail and she is being followed by oncology for a diagnosis of breast cancer.  It is in remission.  She is on tamoxifen.     Past Medical History:  Diagnosis Date  . Anxiety   . Breast cancer ([place]) 06/2018   left breast  . Cancer ([facility])    LEFT BREAST   . Complication of anesthesia    panic attacks  . GERD (gastroesophageal reflux disease)   . Headache    migraines/stress, optical migraines  . History of kidney stones   . Hypertension   . Multilevel degenerative disc disease   . Personal history of radiation therapy   . PONV (postoperative nausea and vomiting)   . Vertigo    last episode 10/2016-NO PROBLEMS SINCE SINUS SURGERY IN 2018  . Wears contact lenses     Patient Active Problem List   Diagnosis Date Noted  . 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 Genetic testing 01/14/2019  . Malignant neoplasm of  upper-outer quadrant of left breast in female, estrogen receptor positive ([place]) 07/06/2018  . Breast cancer ([place]) 06/18/2018  . Migraines 08/03/2015    Past Surgical History:  Procedure Laterality Date  . BREAST BIOPSY Right 06/08/2016   CYSTIC PAPILLARY APOCRINE METAPLASIA.   Marland Kitchen BREAST BIOPSY Left 06/06/2018   left breast stereo X clip positive  . BREAST EXCISIONAL BIOPSY Left 06/22/2018   lumpectomy with sn and nl  . BREAST LUMPECTOMY Left 06/22/2018  . CYSTOSTOMY Right 05/10/2019   Procedure: RIGHT OVARIAN CYSTOSTOMY;  Surgeon: Schermerhorn, Gwen Her, MD;  Location: [facility];  Service: Gynecology;  Laterality: Right;  . FRONTAL SINUS EXPLORATION Bilateral 12/22/2016   Procedure: FRONTAL SINUS EXPLORATION;  Surgeon: Margaretha Sheffield, MD;  Location: [place];  Service: ENT;  Laterality: Bilateral;  . IMAGE GUIDED SINUS SURGERY N/A 12/22/2016   Procedure: IMAGE GUIDED SINUS SURGERY;  Surgeon: Margaretha Sheffield, MD;  Location: [place];  Service: ENT;  Laterality: N/A;  . LAPAROSCOPIC TUBAL LIGATION Bilateral 05/10/2019   Procedure: LAPAROSCOPIC TUBAL LIGATION, falope rings Right fallope ring  Left tubal cautery;  Surgeon: Schermerhorn, Gwen Her, MD;  Location: [facility];  Service: Gynecology;  Laterality: Bilateral;  . MAXILLARY ANTROSTOMY Bilateral 12/22/2016  Procedure: MAXILLARY ANTROSTOMY;  Surgeon: Margaretha Sheffield, MD;  Location: [place];  Service: ENT;  Laterality: Bilateral;  . PARTIAL MASTECTOMY WITH NEEDLE LOCALIZATION Left 06/22/2018   Procedure: PARTIAL MASTECTOMY WITH NEEDLE LOCALIZATION;  Surgeon: Herbert Pun, MD;  Location: [facility];  Service: General;  Laterality: Left;  . SENTINEL NODE BIOPSY Left 06/22/2018   Procedure: SENTINEL NODE BIOPSY;  Surgeon: Herbert Pun, MD;  Location: [facility];  Service: General;  Laterality: Left;  . SEPTOPLASTY N/A 12/22/2016   Procedure: SEPTOPLASTY;  Surgeon: Margaretha Sheffield, MD;  Location: [place];  Service: ENT;  Laterality: N/A;  GAVE DISK TO CE[facility] 4-5 KP  . TURBINATE REDUCTION N/A 12/22/2016   Procedure: TURBINATE REDUCTION partial inferior;  Surgeon: Margaretha Sheffield, MD;  Location: [place];  Service: ENT;  Laterality: N/A;    OB History   No obstetric history on file.      Home Medications    Prior to Admission medications   Medication Sig Start Date End Date Taking? Authorizing Provider  meloxicam (MOBIC) 7.5 MG tablet Take 1 tablet (7.5 mg total) by mouth daily. 12/31/20  Yes Verda Cumins, MD  ALPRAZolam Duanne Moron) 0.25 MG tablet Take 1 tablet (0.25 mg total) by mouth daily as needed for anxiety. 07/15/20   Juline Patch, MD  calcium-vitamin D (OSCAL WITH D) 500-200 MG-UNIT tablet Take 1 tablet by mouth.    [provider]  ezetimibe (ZETIA) 10 MG tablet TAKE 1 TABLET BY MOUTH EVERY DAY 09/04/20   Juline Patch, MD  hydrochlorothiazide (MICROZIDE) 12.5 MG capsule Take 1 capsule (12.5 mg total) by mouth daily. 07/15/20   Juline Patch, MD  levocetirizine (XYZAL) 5 MG tablet Take 5 mg by mouth daily.     [provider]  naproxen sodium (ALEVE) 220 MG tablet Take 220-440 mg by mouth 2 (two) times daily as needed (pain.).    [provider]  NON FORMULARY Place 3 drops under the tongue at bedtime. Allergy Oral Drops made by [facility]    [provider]  omeprazole (PRILOSEC OTC) 20 MG tablet Take 2 tablets (40 mg total) by mouth daily before breakfast. 07/15/20   Juline Patch, MD  potassium chloride SA (KLOR-CON) 20 MEQ tablet Take 1 tablet (20 mEq total) by mouth daily. 08/24/20   Sindy Guadeloupe, MD  sertraline (ZOLOFT) 50 MG tablet Take 1 tablet (50 mg total) by mouth daily. Initiate one half tablet a day for 8 days then 1 a day 07/15/20   Juline Patch, MD  tamoxifen (NOLVADEX) 20 MG tablet TAKE 1 TABLET BY MOUTH EVERY DAY 11/18/20   Sindy Guadeloupe, MD  traMADol (ULTRAM) 50 MG tablet Take 50 mg by mouth every 6 (six)  hours as needed (pain).    [provider]  traZODone (DESYREL) 50 MG tablet Take 0.5-1 tablets (25-50 mg total) by mouth at bedtime as needed for sleep. 07/15/20   Juline Patch, MD  triamcinolone (NASACORT) 55 MCG/ACT AERO nasal inhaler Place 1 spray into the nose daily.    [provider]  vitamin B-12 (CYANOCOBALAMIN) 1000 MCG tablet Take 1,000 mcg by mouth daily.    [provider]    Family History Family History  Problem Relation Age of Onset  . Breast cancer Mother        dx 49s; 2nd primary at 9  . Breast cancer Paternal Grandmother        dx 35s; deceased 26s  .  Breast cancer Paternal Aunt        dx 36s; currently 78s  . Breast cancer Cousin        dx 25s; currently late 55s; daughter of a paternal uncle  . Lung cancer Paternal Uncle        smoker; deceased    Social History Social History   Tobacco Use  . Smoking status: Former Smoker    Packs/day: 1.00    Years: 15.00    Pack years: 15.00    Types: Cigarettes    Quit date: 2013    Years since quitting: 9.3  . Smokeless tobacco: Never Used  Vaping Use  . Vaping Use: Former  Substance Use Topics  . Alcohol use: Yes    Alcohol/week: 2.0 standard drinks    Types: 1 Glasses of wine, 1 Cans of beer per week    Comment: social (4-6 drinks/month)  . Drug use: No     Allergies   Quinolones, Clindamycin, and Codeine   Review of Systems Review of Systems  Constitutional: Positive for activity change. Negative for appetite change, chills, diaphoresis and fever.  HENT: Negative.  Negative for congestion.   Eyes: Negative.  Negative for pain.  Respiratory: Negative.  Negative for cough.   Cardiovascular: Negative.  Negative for chest pain.  Gastrointestinal: Negative.  Negative for abdominal pain.  Genitourinary: Negative.  Negative for dysuria.  Musculoskeletal: Positive for arthralgias, gait problem and joint swelling. Negative for back pain, myalgias, neck pain and neck  stiffness.  Skin: Negative for color change, pallor, rash and wound.  Neurological: Negative for dizziness, numbness and headaches.  All other systems reviewed and are negative.    Physical Exam Triage Vital Signs ED Triage Vitals [12/31/20 0937]  Enc Vitals Group     BP (!) 127/95     Pulse Rate 81     Resp 18     Temp 98.4 F (36.9 C)     Temp Source Oral     SpO2 100 %     Weight 175 lb (79.4 kg)     Height 5\' 5"  (1.651 m)     Head Circumference      Peak Flow      Pain Score 3     Pain Loc      Pain Edu?      Excl. in [place]?    No data found.  Updated Vital Signs BP (!) 127/95   Pulse 81   Temp 98.4 F (36.9 C) (Oral)   Resp 18   Ht 5\' 5"  (1.651 m)   Wt 79.4 kg   SpO2 100%   BMI 29.12 kg/m   Visual Acuity Right Eye Distance:   Left Eye Distance:   Bilateral Distance:    Right Eye Near:   Left Eye Near:    Bilateral Near:     Physical Exam Vitals and nursing note reviewed.  Constitutional:      General: She is not in acute distress.    Appearance: Normal appearance. She is not ill-appearing, toxic-appearing or diaphoretic.  HENT:     Head: Normocephalic and atraumatic.  Eyes:     General: No scleral icterus.       Right eye: No discharge.        Left eye: No discharge.     Extraocular Movements: Extraocular movements intact.     Conjunctiva/sclera: Conjunctivae normal.     Pupils: Pupils are equal, round, and reactive to light.  Cardiovascular:  Rate and Rhythm: Normal rate and regular rhythm.     Pulses: Normal pulses.     Heart sounds: Normal heart sounds.  Pulmonary:     Effort: Pulmonary effort is normal.     Breath sounds: Normal breath sounds.  Musculoskeletal:     Cervical back: Normal range of motion and neck supple.     Right knee: Swelling (in pop fossa) and crepitus (in patellofemoral compartment) present. No effusion, erythema, ecchymosis or lacerations. Decreased range of motion. Tenderness present over the medial joint line. No  LCL laxity, MCL laxity, ACL laxity or PCL laxity. Normal alignment. Normal pulse.     Instability Tests: Anterior drawer test negative. Posterior drawer test negative. Anterior Lachman test negative. Medial McMurray test negative and lateral McMurray test negative.     Left knee: Normal.  Skin:    General: Skin is warm and dry.     Capillary Refill: Capillary refill takes less than 2 seconds.     Findings: No erythema, lesion or rash.  Neurological:     General: No focal deficit present.     Mental Status: She is alert and oriented to person, place, and time.      UC Treatments / Results  Labs (all labs ordered are listed, but only abnormal results are displayed) Labs Reviewed - No data to display  EKG   Radiology DG Knee Complete 4 Views Right  Result Date: 12/31/2020 CLINICAL DATA:  Patellofemoral compartment knee pain. History of breast cancer. EXAM: RIGHT KNEE - COMPLETE 4+ VIEW COMPARISON:  None. FINDINGS: No evidence of fracture or bone lesion. No degenerative joint narrowing or significant spurring. IMPRESSION: No acute finding or visible bone lesion. Electronically Signed   By: Monte Fantasia M.D.   On: 12/31/2020 11:32    Procedures Procedures (including critical care time)  Medications Ordered in UC Medications - No data to display  Initial Impression / Assessment and Plan / UC Course  I have reviewed the triage vital signs and the nursing notes.  Pertinent labs & imaging results that were available during my care of the patient were reviewed by me and considered in my medical decision making (see chart for details).  Clinical impression: Right knee pain for about 3 months this acutely worsened.  Clinically she has a Baker's cyst.  A little tender in the medial joint line.  She may well have a degenerative meniscus.  No history of trauma.  Cannot fully rule out degenerative changes either.  Treatment plan: 1.  The findings and treatment plan were discussed in  detail with the patient.  Patient was in agreement. 2.  Recommended getting a x-ray of the knee.  My independent review reveals some minimal spurring mostly in the patellofemoral compartment.  There is no acute fracture or dislocation.  Medial and lateral joint spaces are well-preserved.  Await radiology overread. 3.  Educational handouts provided. 4.  Prescribed meloxicam to see if this will assist her.  Warned her not to take any other anti-inflammatories.  She needs to take it with food.  She can supplement with Tylenol only.  As noted in the history she does have a history of breast cancer and is currently in remission.  She is being followed by oncology.  She is on tamoxifen.  I have asked her to check with her oncologist prior to initiating the meloxicam to make sure there are no contraindications.  She voiced verbal understanding will do so. 5.  Gave her a contact information to  see orthopedics to see if a steroid injection was indicated.  If she continues to have any significant mechanical issues she may need an MRI. 6.  If she cannot get into orthopedics and her symptoms persist she can come back here or see her primary care provider. 7.  Work note was not needed as she is self-employed. 8.  Supportive care, over-the-counter meds as needed, icing several times a day. 9.  Discharged in stable condition and she will follow-up here as needed.  Of note, 45 minutes was spent with the patient in the urgent care today.  This was spent on obtaining a history, doing a thorough physical exam, obtaining review of systems, ordering tests and interpreting them, coming up with a treatment plan, reviewing her chart extensively, counseling her on medication use as well as the fact that she has chronic medical conditions including breast cancer and is on tamoxifen, and answering and encouraging all questions.    Final Clinical Impressions(s) / UC Diagnoses   Final diagnoses:  Acute pain of right knee  Baker  cyst, right  Patellofemoral arthralgia of right knee     Discharge Instructions     As we discussed clinically you have a Baker's cyst with some minimal arthritis in your knee. Your x-rays do not show any acute findings.  If anything was to show up per radiology I will contact you. Please see educational handouts. I have also referred you to orthopedics.  Please call them to make an appointment. If symptoms persist before you get into orthopedics please see your primary care provider. I also sent in a prescription for meloxicam.  Please check with your cancer doctors to see if you are able to take this. If you are not able to take it then please just use Tylenol as needed. There is no indication for a brace as there is no instability. Please work on range of motion, ice it several times a day. If you lock up where you cannot move it and you are in extreme pain then please go to the ER.    ED Prescriptions    Medication Sig Dispense Auth. Provider   meloxicam (MOBIC) 7.5 MG tablet Take 1 tablet (7.5 mg total) by mouth daily. 30 tablet Verda Cumins, MD     PDMP not reviewed this encounter.   Verda Cumins, MD 01/02/21 1359

## 2021-01-28 ENCOUNTER — Encounter: Payer: Self-pay | Admitting: Family Medicine

## 2021-01-28 ENCOUNTER — Other Ambulatory Visit: Payer: Self-pay

## 2021-01-28 DIAGNOSIS — E7801 Familial hypercholesterolemia: Secondary | ICD-10-CM

## 2021-01-28 MED ORDER — ATORVASTATIN CALCIUM 10 MG PO TABS: 10.0000 mg | ORAL_TABLET | Freq: Every day | ORAL | 1 refills | Status: DC

## 2021-01-28 NOTE — Progress Notes (Signed)
Sent in Atorvastatin

## 2021-02-05 ENCOUNTER — Other Ambulatory Visit: Payer: Self-pay | Admitting: Family Medicine

## 2021-02-05 DIAGNOSIS — F5101 Primary insomnia: Secondary | ICD-10-CM

## 2021-02-07 IMAGING — [facility] DIGITAL DIAGNOSTIC UNILATERAL LEFT MAMMOGRAM WITH TOMO AND CAD
5 series · 6 of 13 positions shown · non-contrast
Comparison: Previous exam(s).

CLINICAL DATA: 48-year-old who underwent malignant lumpectomy of
the UPPER OUTER QUADRANT of the LEFT breast in May 2018 for
high-grade DCIS and invasive mammary carcinoma of no special type
with negative sentinel lymph nodes. Patient underwent adjuvant
radiation therapy which was completed in August 2018. She presents
now with inflammation and edema involving the LEFT breast associated
with intermittent stabbing pain. The patient states that the pain
and swelling were partially relieved with a short course of
corticosteroid therapy.

EXAM:
DIGITAL DIAGNOSTIC LEFT MAMMOGRAM WITH CAD
ULTRASOUND LEFT BREAST

[L CC]
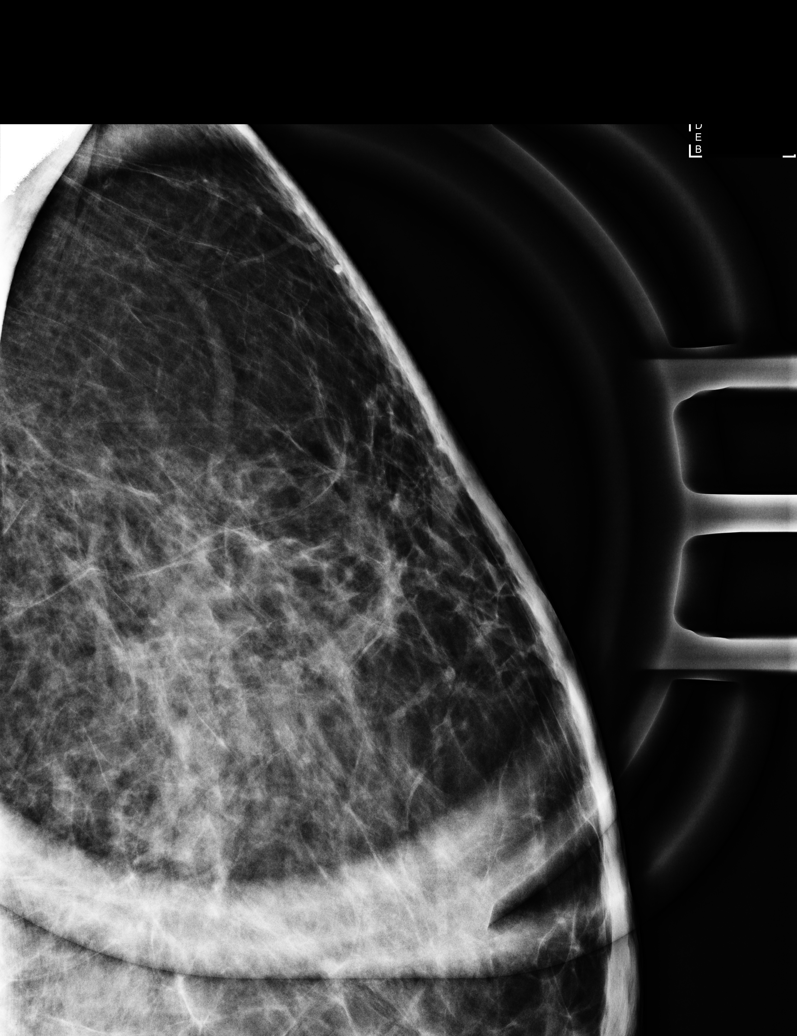

[L CC synth-2D]
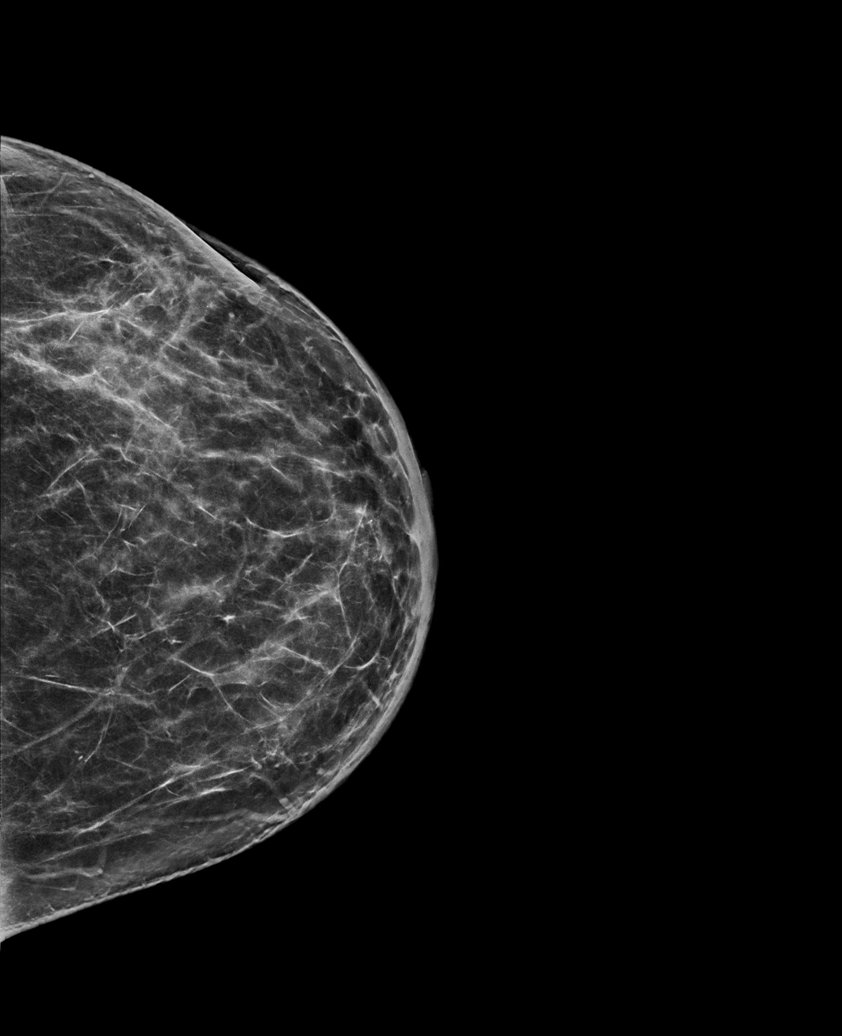

[L MLO synth-2D]
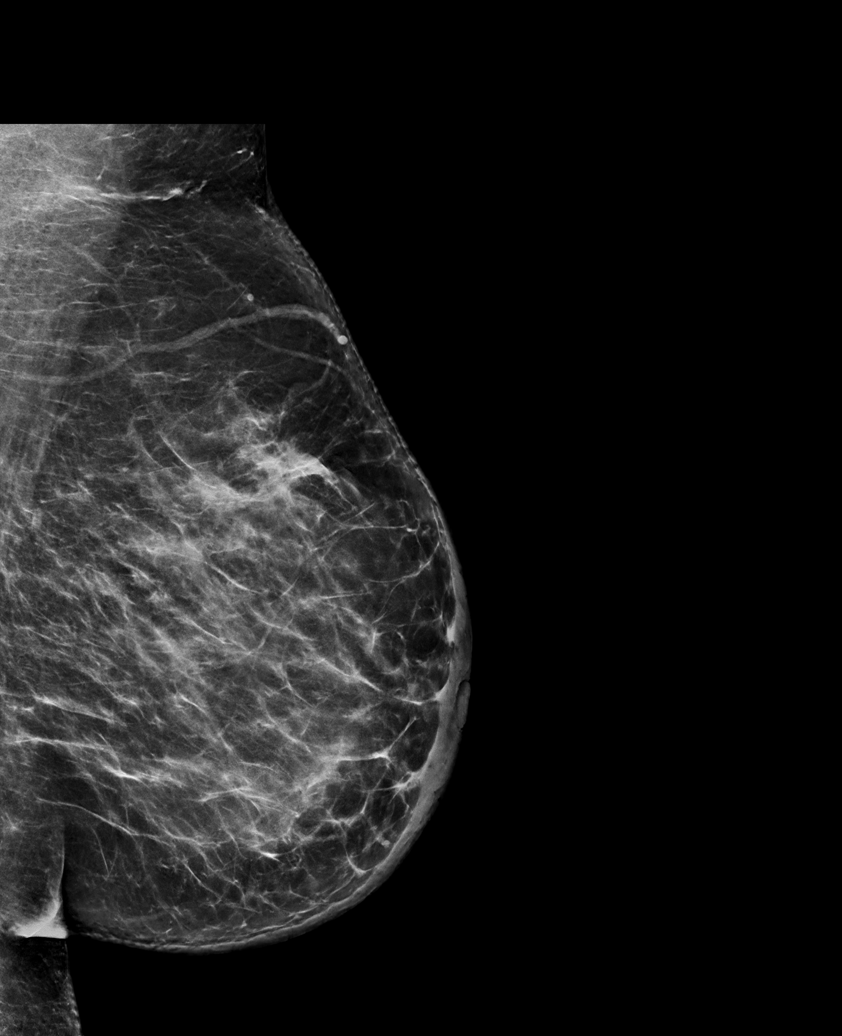

[L CC tomo · 2 of 81 frames shown]
[frame 27/81]
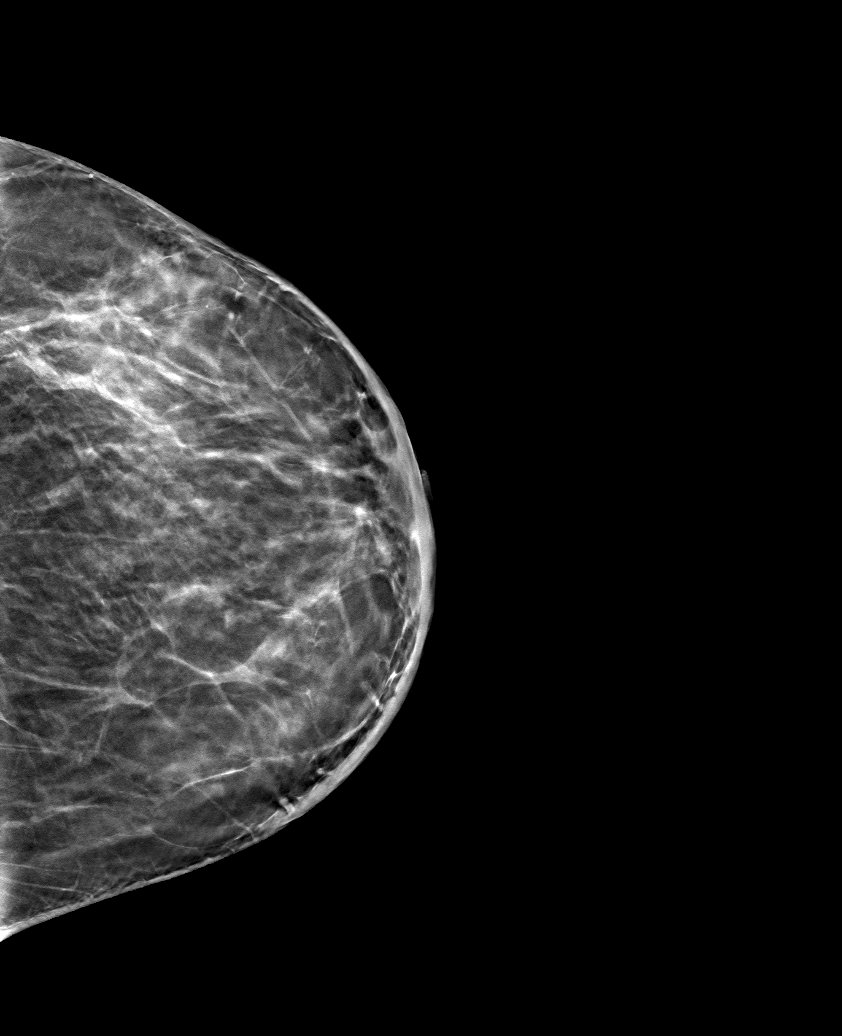
[frame 41/81]
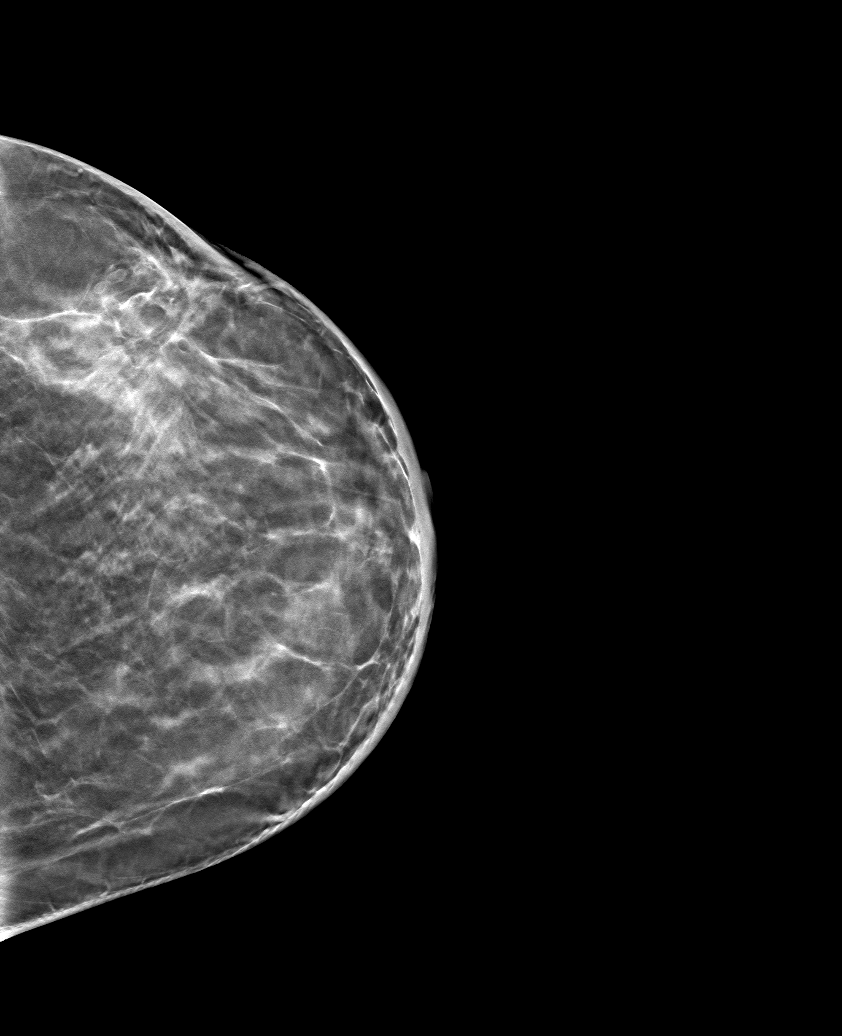

[L MLO tomo · tomo slice 44/87.0]
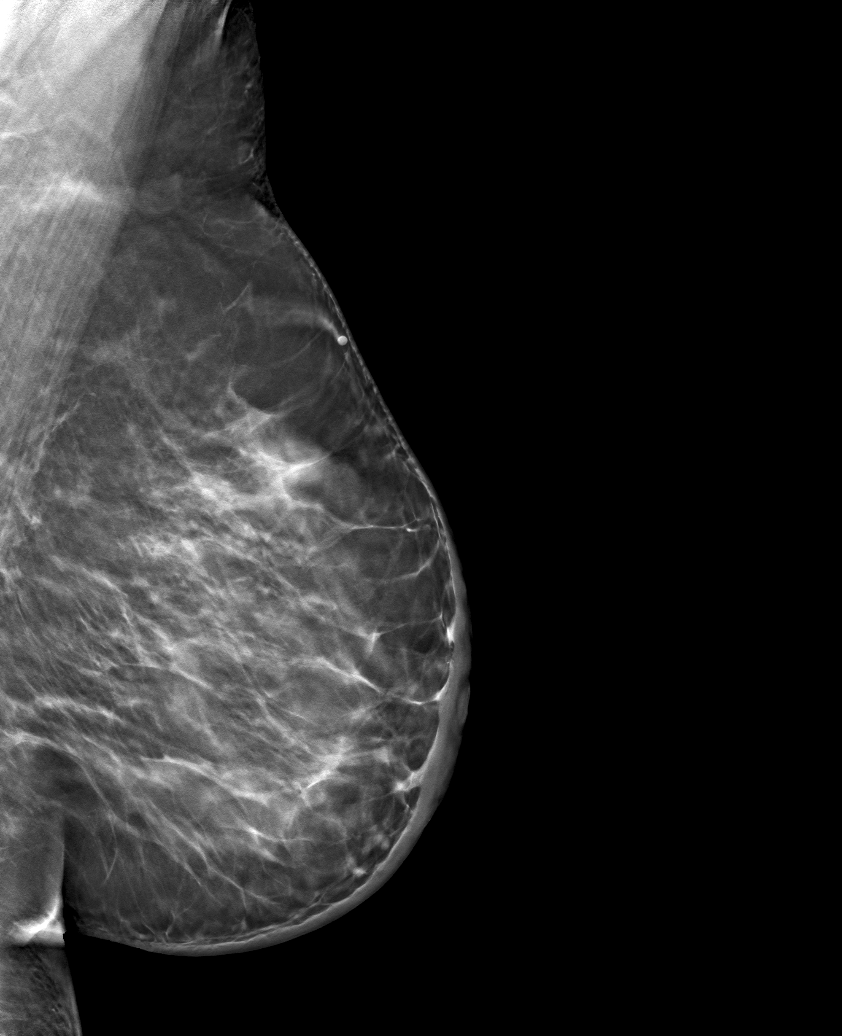

[6 of 13 positions shown; findings below may reference images not displayed]

ACR Breast Density Category c: The breast tissue is heterogeneously
dense, which may obscure small masses.
FINDINGS: On physical exam, the skin of the LEFT breast is darker in color
than that of the RIGHT breast. Edema is present in the skin of the
LEFT breast. I do not palpate a discrete LEFT breast mass.

Targeted LEFT breast ultrasound is performed initially, showing skin
thickening diffusely throughout the LEFT breast with edema in the
subcutaneous tissues of the breast in the UPPER OUTER QUADRANT and
axillary tail. There is no suspicious solid mass, abnormal acoustic
shadowing or abnormal fluid collection in the LEFT breast.

Because the edema/inflammation is a diffuse process involving the
entire LEFT breast, tomosynthesis and synthesized full field CC and
MLO mammographic images of the LEFT breast were obtained to complete
the diagnostic workup.

Moderate diffuse edema/trabecular thickening and skin thickening
throughout the LEFT breast is present, typical for post radiation
changes. Post lumpectomy scar/architectural distortion is present in
the UPPER OUTER QUADRANT at MIDDLE to POSTERIOR depth. No findings
suspicious for malignancy in the LEFT breast.

Mammographic images were processed with CAD.
IMPRESSION: 1. Moderate diffuse edema/trabecular thickening and skin thickening
throughout the LEFT breast which is typical for post radiation
changes. Lymphedema can have a similar appearance.
2. Expected post lumpectomy changes.
3. No mammographic or sonographic evidence of malignancy involving
the LEFT breast.

RECOMMENDATION:
Annual BILATERAL diagnostic mammography which is due in late
[REDACTED] or May 2019.

I have discussed the findings and recommendations with the patient.
If applicable, a reminder letter will be sent to the patient
regarding the next appointment.

BI-RADS CATEGORY  2: Benign.

## 2021-02-18 ENCOUNTER — Encounter: Payer: Self-pay | Admitting: Oncology

## 2021-02-18 NOTE — Telephone Encounter (Signed)
Can one of you please see her?

## 2021-02-19 NOTE — Telephone Encounter (Signed)
Added her to your schedule. Spoke with patient to confirm.

## 2021-02-22 ENCOUNTER — Inpatient Hospital Stay: Payer: Managed Care, Other (non HMO) | Admitting: Oncology

## 2021-02-22 ENCOUNTER — Encounter: Payer: Self-pay | Admitting: Nurse Practitioner

## 2021-02-22 ENCOUNTER — Inpatient Hospital Stay: Payer: Managed Care, Other (non HMO) | Attending: Nurse Practitioner | Admitting: Nurse Practitioner

## 2021-02-22 VITALS — BP 111/76 | HR 94 | Temp 99.3°F | Resp 18 | Wt 187.7 lb

## 2021-02-22 DIAGNOSIS — Z79899 Other long term (current) drug therapy: Secondary | ICD-10-CM | POA: Insufficient documentation

## 2021-02-22 DIAGNOSIS — N6320 Unspecified lump in the left breast, unspecified quadrant: Secondary | ICD-10-CM

## 2021-02-22 DIAGNOSIS — Z801 Family history of malignant neoplasm of trachea, bronchus and lung: Secondary | ICD-10-CM | POA: Insufficient documentation

## 2021-02-22 DIAGNOSIS — Z87442 Personal history of urinary calculi: Secondary | ICD-10-CM | POA: Diagnosis not present

## 2021-02-22 DIAGNOSIS — Z803 Family history of malignant neoplasm of breast: Secondary | ICD-10-CM | POA: Insufficient documentation

## 2021-02-22 DIAGNOSIS — Z881 Allergy status to other antibiotic agents status: Secondary | ICD-10-CM | POA: Insufficient documentation

## 2021-02-22 DIAGNOSIS — R590 Localized enlarged lymph nodes: Secondary | ICD-10-CM | POA: Insufficient documentation

## 2021-02-22 DIAGNOSIS — Z79810 Long term (current) use of selective estrogen receptor modulators (SERMs): Secondary | ICD-10-CM | POA: Diagnosis not present

## 2021-02-22 DIAGNOSIS — Z885 Allergy status to narcotic agent status: Secondary | ICD-10-CM | POA: Diagnosis not present

## 2021-02-22 DIAGNOSIS — Z87891 Personal history of nicotine dependence: Secondary | ICD-10-CM | POA: Insufficient documentation

## 2021-02-22 DIAGNOSIS — Z923 Personal history of irradiation: Secondary | ICD-10-CM | POA: Diagnosis not present

## 2021-02-22 DIAGNOSIS — Z170 Estrogen receptor positive status [ER+]: Secondary | ICD-10-CM | POA: Insufficient documentation

## 2021-02-22 DIAGNOSIS — C50812 Malignant neoplasm of overlapping sites of left female breast: Secondary | ICD-10-CM | POA: Diagnosis not present

## 2021-02-22 NOTE — Progress Notes (Signed)
Patient noticed a new know on left breast on 6/22.

## 2021-02-22 NOTE — Progress Notes (Signed)
[facility] [place]  Telephone:([phone] [phone]  Patient Care Team: Juline Patch, MD as PCP - General (Family Medicine)   Name of the patient: Tiffany Humphrey  [id number]  06/26/1971   Date of visit: 02/22/21  Diagnosis- hx of left breast cancer  Chief complaint/ Reason for visit- new left breast mass  Heme/Onc history:  Oncology History  Breast cancer ([place])  06/18/2018 Initial Diagnosis   Breast cancer ([place])    06/29/2018 Cancer Staging   Staging form: Breast, AJCC 8th Edition - Pathologic stage from 06/29/2018: Stage IA (pT1a, pN0, cM0, G2, ER+, PR+, HER2-) - Signed by Sindy Guadeloupe, MD on 07/02/2018      Interval history- Patient is 50 year old female with prior history of Stage I left breast cancer s/p lumpectomy and adjuvant radiation, currently on tamoxifen who [facility] for complaints of left breast mass. She felt mass during routine breast exam 2 weeks ago at the 5-6:00 position. Husband confirmed. Area is tender to touch. When mass didn't go away, she called clinic for evaluation. Family history of breast cancer and personal history of cancer increase concern for recurrence. She also has noticed increased fullness of the right and left axilla. She has chronic tenderness of the left breast at site of her lumpectomy. Denies any neurologic complaints. Denies recent fevers or illnesses. Denies any easy bleeding or bruising. Reports good appetite and denies weight loss. Denies chest pain. Denies any nausea, vomiting, constipation, or diarrhea. Denies urinary complaints. Patient offers no further specific complaints today.  ECOG FS:1 - Symptomatic but completely ambulatory  Review of systems- Review of Systems  Constitutional:  Negative for chills, fever, malaise/fatigue and weight loss.  HENT:  Negative for hearing loss, nosebleeds, sore throat and tinnitus.   Eyes:  Negative for blurred  vision and double vision.  Respiratory:  Negative for cough, hemoptysis, shortness of breath and wheezing.   Cardiovascular:  Negative for chest pain, palpitations and leg swelling.  Gastrointestinal:  Negative for abdominal pain, blood in stool, constipation, diarrhea, melena, nausea and vomiting.  Genitourinary:  Negative for dysuria and urgency.  Musculoskeletal:  Negative for back pain, falls, joint pain and myalgias.  Skin:  Negative for itching and rash.  Neurological:  Negative for dizziness, tingling, sensory change, loss of consciousness, weakness and headaches.  Endo/Heme/Allergies:  Negative for environmental allergies. Does not bruise/bleed easily.  Psychiatric/Behavioral:  Negative for depression. The patient is nervous/anxious. The patient does not have insomnia.      Allergies  Allergen Reactions   Quinolones Hives   Clindamycin Hives   Codeine Other (See Comments) and Palpitations    tachycardia    Past Medical History:  Diagnosis Date   Anxiety    Breast cancer ([place]) 06/2018   left breast   Cancer ([place])    LEFT BREAST    Complication of anesthesia    panic attacks   GERD (gastroesophageal reflux disease)    Headache    migraines/stress, optical migraines   History of kidney stones    Hypertension    Multilevel degenerative disc disease    Personal history of radiation therapy    PONV (postoperative nausea and vomiting)    Vertigo    last episode 10/2016-NO PROBLEMS SINCE SINUS SURGERY IN 2018   Wears contact lenses     Past Surgical History:  Procedure Laterality Date   BREAST BIOPSY Right 06/08/2016   CYSTIC PAPILLARY APOCRINE METAPLASIA.  BREAST BIOPSY Left 06/06/2018   left breast stereo X clip positive   BREAST EXCISIONAL BIOPSY Left 06/22/2018   lumpectomy with sn and nl   BREAST LUMPECTOMY Left 06/22/2018   CYSTOSTOMY Right 05/10/2019   Procedure: RIGHT OVARIAN CYSTOSTOMY;  Surgeon: Schermerhorn, Gwen Her, MD;  Location: [facility];  Service:  Gynecology;  Laterality: Right;   FRONTAL SINUS EXPLORATION Bilateral 12/22/2016   Procedure: FRONTAL SINUS EXPLORATION;  Surgeon: Margaretha Sheffield, MD;  Location: [place];  Service: ENT;  Laterality: Bilateral;   IMAGE GUIDED SINUS SURGERY N/A 12/22/2016   Procedure: IMAGE GUIDED SINUS SURGERY;  Surgeon: Margaretha Sheffield, MD;  Location: [place];  Service: ENT;  Laterality: N/A;   LAPAROSCOPIC TUBAL LIGATION Bilateral 05/10/2019   Procedure: LAPAROSCOPIC TUBAL LIGATION, falope rings Right fallope ring  Left tubal cautery;  Surgeon: Boykin Nearing, MD;  Location: [facility];  Service: Gynecology;  Laterality: Bilateral;   MAXILLARY ANTROSTOMY Bilateral 12/22/2016   Procedure: MAXILLARY ANTROSTOMY;  Surgeon: Margaretha Sheffield, MD;  Location: [place];  Service: ENT;  Laterality: Bilateral;   PARTIAL MASTECTOMY WITH NEEDLE LOCALIZATION Left 06/22/2018   Procedure: PARTIAL MASTECTOMY WITH NEEDLE LOCALIZATION;  Surgeon: Herbert Pun, MD;  Location: [facility];  Service: General;  Laterality: Left;   SENTINEL NODE BIOPSY Left 06/22/2018   Procedure: SENTINEL NODE BIOPSY;  Surgeon: Herbert Pun, MD;  Location: [facility];  Service: General;  Laterality: Left;   SEPTOPLASTY N/A 12/22/2016   Procedure: SEPTOPLASTY;  Surgeon: Margaretha Sheffield, MD;  Location: [place];  Service: ENT;  Laterality: N/A;  GAVE DISK TO CECE 4-5 KP   TURBINATE REDUCTION N/A 12/22/2016   Procedure: TURBINATE REDUCTION partial inferior;  Surgeon: Margaretha Sheffield, MD;  Location: [place];  Service: ENT;  Laterality: N/A;    Social History   Socioeconomic History   Marital status: Married    Spouse name: Not on file   Number of children: Not on file   Years of education: Not on file   Highest education level: Not on file  Occupational History   Not on file  Tobacco Use   Smoking status: Former    Packs/day: 1.00    Years: 15.00    Pack years: 15.00    Types: Cigarettes     Quit date: 2013    Years since quitting: 9.4   Smokeless tobacco: Never  Vaping Use   Vaping Use: Former  Substance and Sexual Activity   Alcohol use: Yes    Alcohol/week: 2.0 standard drinks    Types: 1 Glasses of wine, 1 Cans of beer per week    Comment: social (4-6 drinks/month)   Drug use: No   Sexual activity: Yes  Other Topics Concern   Not on file  Social History Narrative   Not on file   Social Determinants of Health   Financial Resource Strain: Not on file  Food Insecurity: Not on file  Transportation Needs: Not on file  Physical Activity: Not on file  Stress: Not on file  Social Connections: Not on file  Intimate Partner Violence: Not on file    Family History  Problem Relation Age of Onset   Breast cancer Mother        dx 22s; 29nd primary at 85   Breast cancer Paternal Grandmother        dx 23s; deceased 47s   Breast cancer Paternal Aunt        dx 76s; currently 35s   Breast cancer Cousin  dx 29s; currently late 49s; daughter of a paternal uncle   Lung cancer Paternal Uncle        smoker; deceased     Current Outpatient Medications:    ALPRAZolam (XANAX) 0.25 MG tablet, Take 1 tablet (0.25 mg total) by mouth daily as needed for anxiety., Disp: 30 tablet, Rfl: 5   atorvastatin (LIPITOR) 10 MG tablet, Take 1 tablet (10 mg total) by mouth daily., Disp: 30 tablet, Rfl: 1   calcium-vitamin D (OSCAL WITH D) 500-200 MG-UNIT tablet, Take 1 tablet by mouth., Disp: , Rfl:    hydrochlorothiazide (MICROZIDE) 12.5 MG capsule, Take 1 capsule (12.5 mg total) by mouth daily., Disp: 90 capsule, Rfl: 1   levocetirizine (XYZAL) 5 MG tablet, Take 5 mg by mouth daily. , Disp: , Rfl:    naproxen sodium (ALEVE) 220 MG tablet, Take 220-440 mg by mouth 2 (two) times daily as needed (pain.)., Disp: , Rfl:    NON FORMULARY, Place 3 drops under the tongue at bedtime. Allergy Oral Drops made by [facility], Disp: , Rfl:    omeprazole (PRILOSEC OTC) 20 MG tablet, Take 2  tablets (40 mg total) by mouth daily before breakfast., Disp: 90 tablet, Rfl: 1   sertraline (ZOLOFT) 50 MG tablet, Take 1 tablet (50 mg total) by mouth daily. Initiate one half tablet a day for 8 days then 1 a day, Disp: 90 tablet, Rfl: 1   tamoxifen (NOLVADEX) 20 MG tablet, TAKE 1 TABLET BY MOUTH EVERY DAY, Disp: 90 tablet, Rfl: 1   traMADol (ULTRAM) 50 MG tablet, Take 50 mg by mouth every 6 (six) hours as needed (pain)., Disp: , Rfl:    traZODone (DESYREL) 50 MG tablet, TAKE 0.5-1 TABLETS BY MOUTH AT BEDTIME AS NEEDED FOR SLEEP., Disp: 30 tablet, Rfl: 0   triamcinolone (NASACORT) 55 MCG/ACT AERO nasal inhaler, Place 1 spray into the nose daily., Disp: , Rfl:    vitamin B-12 (CYANOCOBALAMIN) 1000 MCG tablet, Take 1,000 mcg by mouth daily., Disp: , Rfl:    meloxicam (MOBIC) 7.5 MG tablet, Take 1 tablet (7.5 mg total) by mouth daily. (Patient not taking: Reported on 02/22/2021), Disp: 30 tablet, Rfl: 0   potassium chloride SA (KLOR-CON) 20 MEQ tablet, Take 1 tablet (20 mEq total) by mouth daily. (Patient not taking: Reported on 02/22/2021), Disp: 7 tablet, Rfl: 0  Physical exam:  Vitals:   02/22/21 1102  BP: 111/76  Pulse: 94  Resp: 18  Temp: 99.3 F (37.4 C)  Weight: 187 lb 11.2 oz (85.1 kg)   Physical Exam Constitutional:      General: She is not in acute distress.    Appearance: She is well-developed. She is not ill-appearing.  HENT:     Head: Atraumatic.     Nose: Nose normal.     Mouth/Throat:     Pharynx: No oropharyngeal exudate.  Eyes:     General: No scleral icterus.    Conjunctiva/sclera: Conjunctivae normal.  Cardiovascular:     Rate and Rhythm: Normal rate and regular rhythm.  Pulmonary:     Effort: Pulmonary effort is normal.     Breath sounds: Normal breath sounds.  Chest:  Breasts:    Breasts are asymmetrical.     Right: Axillary adenopathy (no distinct nodularity; axillary fullness) present. No swelling, bleeding, inverted nipple, mass, nipple discharge, skin  change, tenderness or supraclavicular adenopathy.     Left: Mass (6:00, 3 cm from nipple, ~ 1 mm nodularity, hard, mobile, tender) and tenderness (tender at  site of surgical scars) present. No swelling, bleeding, inverted nipple, nipple discharge, skin change, axillary adenopathy or supraclavicular adenopathy.  Abdominal:     General: Bowel sounds are normal. There is no distension.     Palpations: Abdomen is soft.     Tenderness: There is no abdominal tenderness.  Musculoskeletal:        General: No tenderness or deformity. Normal range of motion.     Cervical back: Normal range of motion and neck supple.  Lymphadenopathy:     Upper Body:     Right upper body: Axillary adenopathy (no distinct nodularity; axillary fullness) present. No supraclavicular or pectoral adenopathy.     Left upper body: No supraclavicular, axillary or pectoral adenopathy.  Skin:    General: Skin is warm and dry.     Coloration: Skin is not pale.  Neurological:     General: No focal deficit present.     Mental Status: She is alert and oriented to person, place, and time.  Psychiatric:        Mood and Affect: Mood normal.        Behavior: Behavior normal.     CMP Latest Ref Rng & Units 09/03/2020  Glucose 70 - 99 mg/dL 99  BUN 6 - 20 mg/dL 10  Creatinine 0.44 - 1.00 mg/dL 0.80  Sodium 135 - 145 mmol/L 137  Potassium 3.5 - 5.1 mmol/L 3.8  Chloride 98 - 111 mmol/L 104  CO2 22 - 32 mmol/L 23  Calcium 8.9 - 10.3 mg/dL 8.8(L)  Total Protein 6.5 - 8.1 g/dL -  Total Bilirubin 0.3 - 1.2 mg/dL -  Alkaline Phos 38 - 126 U/L -  AST 15 - 41 U/L -  ALT 0 - 44 U/L -   CBC Latest Ref Rng & Units 08/24/2020  WBC 4.0 - 10.5 K/uL 10.0  Hemoglobin 12.0 - 15.0 g/dL 14.3  Hematocrit 36.0 - 46.0 % 41.6  Platelets 150 - 400 K/uL 318    No images are attached to the encounter.  No results found.  Assessment and plan- Patient is a 50 y.o. female with history of left breast cancer currently on tamoxifen [facility] for complaints of a new left breast mass. Palpable mass on exam. Additionally, right axillary fullness. Given her history, recommend diagnostic mammogram with ultrasound bilaterally to evaluate further. She will follow up with Dr. Janese Banks for results.    Visit Diagnosis 1. Breast mass, left   2. Axillary lymphadenopathy     Patient expressed understanding and was in agreement with this plan. She also understands that She can call clinic at any time with any questions, concerns, or complaints.   Thank you for allowing me to participate in the care of this very pleasant patient.   Beckey Rutter, DNP, AGNP-C [facility] [place]

## 2021-02-22 NOTE — Addendum Note (Signed)
Addended by: Verlon Au on: 02/22/2021 02:00 PM   Modules accepted: Orders

## 2021-02-22 NOTE — Addendum Note (Signed)
Addended by: Verlon Au on: 02/22/2021 02:16 PM   Modules accepted: Orders

## 2021-02-27 ENCOUNTER — Other Ambulatory Visit: Payer: Self-pay | Admitting: Family Medicine

## 2021-02-27 DIAGNOSIS — F5101 Primary insomnia: Secondary | ICD-10-CM

## 2021-02-27 DIAGNOSIS — E7801 Familial hypercholesterolemia: Secondary | ICD-10-CM

## 2021-02-27 NOTE — Telephone Encounter (Signed)
Requested Prescriptions  Pending Prescriptions Disp Refills  . atorvastatin (LIPITOR) 10 MG tablet [Pharmacy Med Name: ATORVASTATIN 10 MG TABLET] 90 tablet 0    Sig: TAKE 1 TABLET BY MOUTH EVERY DAY     Cardiovascular:  Antilipid - Statins Failed - 02/27/2021 12:31 PM      Failed - Total Cholesterol in normal range and within 360 days    Cholesterol, Total  Date Value Ref Range Status  07/15/2020 216 (H) 100 - 199 mg/dL Final         Failed - LDL in normal range and within 360 days    LDL Chol Calc (NIH)  Date Value Ref Range Status  07/15/2020 144 (H) 0 - 99 mg/dL Final         Passed - HDL in normal range and within 360 days    HDL  Date Value Ref Range Status  07/15/2020 46 >39 mg/dL Final         Passed - Triglycerides in normal range and within 360 days    Triglycerides  Date Value Ref Range Status  07/15/2020 146 0 - 149 mg/dL Final         Passed - Patient is not pregnant      Passed - Valid encounter within last 12 months    Recent Outpatient Visits          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

## 2021-02-27 NOTE — Telephone Encounter (Signed)
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

## 2021-03-02 ENCOUNTER — Other Ambulatory Visit: Payer: Self-pay

## 2021-03-02 DIAGNOSIS — F5101 Primary insomnia: Secondary | ICD-10-CM

## 2021-03-02 MED ORDER — TRAZODONE HCL 50 MG PO TABS: ORAL_TABLET | ORAL | 0 refills | Status: DC

## 2021-03-03 ENCOUNTER — Other Ambulatory Visit: Payer: Self-pay | Admitting: Family Medicine

## 2021-03-03 DIAGNOSIS — R601 Generalized edema: Secondary | ICD-10-CM

## 2021-03-03 NOTE — Telephone Encounter (Signed)
   Notes to clinic:  Patient  has appointment  03/11/2021 Review for 90 day supply   Requested Prescriptions  Pending Prescriptions Disp Refills   hydrochlorothiazide (MICROZIDE) 12.5 MG capsule [Pharmacy Med Name: HYDROCHLOROTHIAZIDE 12.5 MG CP] 90 capsule 1    Sig: TAKE 1 CAPSULE BY MOUTH EVERY DAY      Cardiovascular: Diuretics - Thiazide Failed - 03/03/2021 11:16 AM      Failed - Ca in normal range and within 360 days    Calcium  Date Value Ref Range Status  09/03/2020 8.8 (L) 8.9 - 10.3 mg/dL Final          Failed - Valid encounter within last 6 months    Recent Outpatient Visits           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   [facility] Juline Patch, MD   1 year ago Reactive depression   [facility] Juline Patch, MD   1 year ago Reactive depression   [facility] Juline Patch, MD   1 year ago [facility] Juline Patch, MD   2 years ago Urethritis   [facility] Juline Patch, MD       Future Appointments             In 1 week Juline Patch, MD [facility] [place] in normal range and within 360 days    Creatinine, Ser  Date Value Ref Range Status  09/03/2020 0.80 0.44 - 1.00 mg/dL Final          Passed - K in normal range and within 360 days    Potassium  Date Value Ref Range Status  09/03/2020 3.8 3.5 - 5.1 mmol/L Final          Passed - Na in normal range and within 360 days    Sodium  Date Value Ref Range Status  09/03/2020 137 135 - 145 mmol/L Final          Passed - Last BP in normal range    BP Readings from Last 1 Encounters:  02/22/21 111/76

## 2021-03-09 ENCOUNTER — Other Ambulatory Visit: Payer: Self-pay

## 2021-03-09 ENCOUNTER — Other Ambulatory Visit: Payer: Self-pay | Admitting: Nurse Practitioner

## 2021-03-09 ENCOUNTER — Ambulatory Visit
Admission: RE | Admit: 2021-03-09 | Discharge: 2021-03-09 | Disposition: A | Discharge status: Home / Self Care | Payer: Managed Care, Other (non HMO) | Source: Ambulatory Visit | Attending: Nurse Practitioner | Admitting: Nurse Practitioner

## 2021-03-09 DIAGNOSIS — R590 Localized enlarged lymph nodes: Secondary | ICD-10-CM

## 2021-03-09 DIAGNOSIS — N6320 Unspecified lump in the left breast, unspecified quadrant: Secondary | ICD-10-CM | POA: Diagnosis present

## 2021-03-11 ENCOUNTER — Ambulatory Visit: Payer: Managed Care, Other (non HMO) | Admitting: Family Medicine

## 2021-03-11 ENCOUNTER — Other Ambulatory Visit: Payer: Self-pay

## 2021-03-11 ENCOUNTER — Encounter: Payer: Self-pay | Admitting: Family Medicine

## 2021-03-11 DIAGNOSIS — F5101 Primary insomnia: Secondary | ICD-10-CM

## 2021-03-11 DIAGNOSIS — F329 Major depressive disorder, single episode, unspecified: Secondary | ICD-10-CM | POA: Diagnosis not present

## 2021-03-11 DIAGNOSIS — E7801 Familial hypercholesterolemia: Secondary | ICD-10-CM | POA: Diagnosis not present

## 2021-03-11 DIAGNOSIS — F419 Anxiety disorder, unspecified: Secondary | ICD-10-CM

## 2021-03-11 DIAGNOSIS — R601 Generalized edema: Secondary | ICD-10-CM

## 2021-03-11 MED ORDER — SERTRALINE HCL 50 MG PO TABS: 50.0000 mg | ORAL_TABLET | Freq: Every day | ORAL | 1 refills | Status: DC

## 2021-03-11 MED ORDER — ALPRAZOLAM 0.25 MG PO TABS: 0.2500 mg | ORAL_TABLET | Freq: Every day | ORAL | 0 refills | Status: AC | PRN

## 2021-03-11 MED ORDER — ATORVASTATIN CALCIUM 10 MG PO TABS: 10.0000 mg | ORAL_TABLET | Freq: Every day | ORAL | 1 refills | Status: DC

## 2021-03-11 MED ORDER — TRAZODONE HCL 50 MG PO TABS
ORAL_TABLET | ORAL | 1 refills | Status: DC
Start: 2021-03-11 — End: 2021-09-13

## 2021-03-11 MED ORDER — HYDROCHLOROTHIAZIDE 12.5 MG PO CAPS: ORAL_CAPSULE | ORAL | 1 refills | Status: DC

## 2021-03-11 NOTE — Progress Notes (Signed)
Date:  03/11/2021   Name:  Tiffany Humphrey   DOB:  [birth year]   MRN:  [id number]   Chief Complaint: Hypertension, Hyperlipidemia, Depression, Insomnia, and Anxiety  Hypertension This is a chronic problem. The current episode started more than 1 year ago. The problem has been gradually improving since onset. The problem is controlled. Associated symptoms include anxiety. Pertinent negatives include no blurred vision, chest pain, headaches, malaise/fatigue, neck pain, orthopnea, palpitations, peripheral edema, PND, shortness of breath or sweats. There are no associated agents to hypertension. There are no known risk factors for coronary artery disease. Past treatments include diuretics. The current treatment provides moderate improvement. There are no compliance problems.  There is no history of angina, kidney disease, CAD/MI, CVA, heart failure, left ventricular hypertrophy, PVD or retinopathy. There is no history of chronic renal disease, a hypertension causing med or renovascular disease.  Hyperlipidemia This is a chronic problem. The current episode started more than 1 year ago. The problem is controlled. Recent lipid tests were reviewed and are normal. She has no history of chronic renal disease, diabetes, hypothyroidism, liver disease, obesity or nephrotic syndrome. Factors aggravating her hyperlipidemia include thiazides. Pertinent negatives include no chest pain, focal sensory loss, focal weakness, leg pain, myalgias or shortness of breath. Current antihyperlipidemic treatment includes statins. The current treatment provides moderate improvement of lipids. There are no compliance problems.  Risk factors for coronary artery disease include dyslipidemia.  Depression        This is a chronic problem.  The current episode started more than 1 year ago.   The onset quality is gradual.   The problem occurs constantly.  The problem has been gradually improving since onset.  Associated symptoms include  insomnia.  Associated symptoms include no decreased concentration, no fatigue, no helplessness, no hopelessness, not irritable, no restlessness, no decreased interest, no appetite change, no body aches, no myalgias, no headaches, no indigestion, not sad and no suicidal ideas.     The symptoms are aggravated by nothing.  Past treatments include SSRIs - Selective serotonin reuptake inhibitors.  Compliance with treatment is good.  Previous treatment provided moderate relief.  Past medical history includes anxiety.     Pertinent negatives include no hypothyroidism. Insomnia Primary symptoms: no fragmented sleep, no sleep disturbance, no difficulty falling asleep, no somnolence, no frequent awakening, no premature morning awakening, no malaise/fatigue, no napping.   The current episode started more than one year. The onset quality is gradual. The problem has been gradually improving since onset. PMH includes: no hypertension, depression.   Anxiety Symptoms include insomnia. Patient reports no chest pain, decreased concentration, dizziness, nausea, nervous/anxious behavior, palpitations, restlessness, shortness of breath or suicidal ideas.     Lab Results  Component Value Date   CREATININE 0.80 09/03/2020   BUN 10 09/03/2020   NA 137 09/03/2020   K 3.8 09/03/2020   CL 104 09/03/2020   CO2 23 09/03/2020   Lab Results  Component Value Date   CHOL 216 (H) 07/15/2020   HDL 46 07/15/2020   LDLCALC 144 (H) 07/15/2020   TRIG 146 07/15/2020   Lab Results  Component Value Date   TSH 1.730 07/01/2019   No results found for: HGBA1C Lab Results  Component Value Date   WBC 10.0 08/24/2020   HGB 14.3 08/24/2020   HCT 41.6 08/24/2020   MCV 86.8 08/24/2020   PLT 318 08/24/2020   Lab Results  Component Value Date   ALT 28 08/24/2020   AST  29 08/24/2020   ALKPHOS 52 08/24/2020   BILITOT 0.6 08/24/2020     Review of Systems  Constitutional:  Negative for appetite change, chills, fatigue, fever  and malaise/fatigue.  HENT:  Negative for drooling, ear discharge, ear pain and sore throat.   Eyes:  Negative for blurred vision.  Respiratory:  Negative for cough, shortness of breath and wheezing.   Cardiovascular:  Negative for chest pain, palpitations, orthopnea, leg swelling and PND.  Gastrointestinal:  Negative for abdominal pain, blood in stool, constipation, diarrhea and nausea.  Endocrine: Negative for polydipsia.  Genitourinary:  Negative for dysuria, frequency, hematuria and urgency.  Musculoskeletal:  Negative for back pain, myalgias and neck pain.  Skin:  Negative for rash.  Allergic/Immunologic: Negative for environmental allergies.  Neurological:  Negative for dizziness, focal weakness and headaches.  Hematological:  Does not bruise/bleed easily.  Psychiatric/Behavioral:  Positive for depression. Negative for decreased concentration, sleep disturbance and suicidal ideas. The patient has insomnia. The patient is not nervous/anxious.    Patient Active Problem List   Diagnosis Date Noted   Lymphedema of breast 01/29/2019   Genetic testing 01/14/2019   Malignant neoplasm of upper-outer quadrant of left breast in female, estrogen receptor positive ([place]) 07/06/2018   Breast cancer ([place]) 06/18/2018   Migraines 08/03/2015    Allergies  Allergen Reactions   Quinolones Hives   Clindamycin Hives   Codeine Other (See Comments) and Palpitations    tachycardia    Past Surgical History:  Procedure Laterality Date   BREAST BIOPSY Right 06/08/2016   CYSTIC PAPILLARY APOCRINE METAPLASIA.    BREAST BIOPSY Left 06/06/2018   left breast stereo X clip positive   BREAST EXCISIONAL BIOPSY Left 06/22/2018   lumpectomy with sn and nl   BREAST LUMPECTOMY Left 06/22/2018   CYSTOSTOMY Right 05/10/2019   Procedure: RIGHT OVARIAN CYSTOSTOMY;  Surgeon: Schermerhorn, Gwen Her, MD;  Location: [facility];  Service: Gynecology;  Laterality: Right;   FRONTAL SINUS EXPLORATION Bilateral 12/22/2016    Procedure: FRONTAL SINUS EXPLORATION;  Surgeon: Margaretha Sheffield, MD;  Location: [place];  Service: ENT;  Laterality: Bilateral;   IMAGE GUIDED SINUS SURGERY N/A 12/22/2016   Procedure: IMAGE GUIDED SINUS SURGERY;  Surgeon: Margaretha Sheffield, MD;  Location: [place];  Service: ENT;  Laterality: N/A;   LAPAROSCOPIC TUBAL LIGATION Bilateral 05/10/2019   Procedure: LAPAROSCOPIC TUBAL LIGATION, falope rings Right fallope ring  Left tubal cautery;  Surgeon: Boykin Nearing, MD;  Location: [facility];  Service: Gynecology;  Laterality: Bilateral;   MAXILLARY ANTROSTOMY Bilateral 12/22/2016   Procedure: MAXILLARY ANTROSTOMY;  Surgeon: Margaretha Sheffield, MD;  Location: [place];  Service: ENT;  Laterality: Bilateral;   PARTIAL MASTECTOMY WITH NEEDLE LOCALIZATION Left 06/22/2018   Procedure: PARTIAL MASTECTOMY WITH NEEDLE LOCALIZATION;  Surgeon: Herbert Pun, MD;  Location: [facility];  Service: General;  Laterality: Left;   SENTINEL NODE BIOPSY Left 06/22/2018   Procedure: SENTINEL NODE BIOPSY;  Surgeon: Herbert Pun, MD;  Location: [facility];  Service: General;  Laterality: Left;   SEPTOPLASTY N/A 12/22/2016   Procedure: SEPTOPLASTY;  Surgeon: Margaretha Sheffield, MD;  Location: [place];  Service: ENT;  Laterality: N/A;  GAVE DISK TO [facility] 4-5 KP   TURBINATE REDUCTION N/A 12/22/2016   Procedure: TURBINATE REDUCTION partial inferior;  Surgeon: Margaretha Sheffield, MD;  Location: [place];  Service: ENT;  Laterality: N/A;    Social History   Tobacco Use   Smoking status: Former    Packs/day: 1.00  Years: 15.00    Pack years: 15.00    Types: Cigarettes    Quit date: 2013    Years since quitting: 9.5   Smokeless tobacco: Never  Vaping Use   Vaping Use: Former  Substance Use Topics   Alcohol use: Yes    Alcohol/week: 2.0 standard drinks    Types: 1 Glasses of wine, 1 Cans of beer per week    Comment: social (4-6 drinks/month)   Drug use: No      Medication list has been reviewed and updated.  Current Meds  Medication Sig   ALPRAZolam (XANAX) 0.25 MG tablet Take 1 tablet (0.25 mg total) by mouth daily as needed for anxiety.   atorvastatin (LIPITOR) 10 MG tablet TAKE 1 TABLET BY MOUTH EVERY DAY   calcium-vitamin D (OSCAL WITH D) 500-200 MG-UNIT tablet Take 1 tablet by mouth.   hydrochlorothiazide (MICROZIDE) 12.5 MG capsule TAKE 1 CAPSULE BY MOUTH EVERY DAY   levocetirizine (XYZAL) 5 MG tablet Take 5 mg by mouth daily.    naproxen sodium (ALEVE) 220 MG tablet Take 220-440 mg by mouth 2 (two) times daily as needed (pain.).   NON FORMULARY Place 3 drops under the tongue at bedtime. Allergy Oral Drops [facility] by [facility]   omeprazole (PRILOSEC) 40 MG capsule Take 40 mg by mouth daily.   sertraline (ZOLOFT) 50 MG tablet Take 1 tablet (50 mg total) by mouth daily. Initiate one half tablet a day for 8 days then 1 a day   tamoxifen (NOLVADEX) 20 MG tablet TAKE 1 TABLET BY MOUTH EVERY DAY   traMADol (ULTRAM) 50 MG tablet Take 50 mg by mouth every 6 (six) hours as needed (pain).   traZODone (DESYREL) 50 MG tablet TAKE 0.5-1 TABLETS BY MOUTH AT BEDTIME AS NEEDED FOR SLEEP.   triamcinolone (NASACORT) 55 MCG/ACT AERO nasal inhaler Place 1 spray into the nose daily.   vitamin B-12 (CYANOCOBALAMIN) 1000 MCG tablet Take 1,000 mcg by mouth daily.   [DISCONTINUED] meloxicam (MOBIC) 7.5 MG tablet Take 1 tablet (7.5 mg total) by mouth daily.   [DISCONTINUED] omeprazole (PRILOSEC OTC) 20 MG tablet Take 2 tablets (40 mg total) by mouth daily before breakfast. (Patient taking differently: Take 20 mg by mouth daily.)    PHQ 2/9 Scores 03/11/2021 07/15/2020 01/31/2020 08/15/2019  PHQ - 2 Score 0 0 0 0  PHQ- 9 Score 3 2 6  0    GAD 7 : Generalized Anxiety Score 03/11/2021 07/15/2020 01/31/2020 08/15/2019  Nervous, Anxious, on Edge 0 0 0 0  Control/stop worrying 0 0 0 0  Worry too much - different things 0 0 0 0  Trouble relaxing 0 1 0 0  Restless 0  0 0 0  Easily annoyed or irritable 0 2 0 0  Afraid - awful might happen 0 0 0 0  Total GAD 7 Score 0 3 0 0  Anxiety Difficulty - Not difficult at all Not difficult at all -    BP Readings from Last 3 Encounters:  03/11/21 120/68  02/22/21 111/76  12/31/20 (!) 127/95    Physical Exam Vitals and nursing note reviewed.  Constitutional:      General: She is not irritable.She is not in acute distress.    Appearance: She is not diaphoretic.  HENT:     Head: Normocephalic and atraumatic.     Right Ear: Tympanic membrane, ear canal and external ear normal.     Left Ear: Tympanic membrane, ear canal and external ear normal.  Nose: Nose normal. No congestion or rhinorrhea.     Mouth/Throat:     Mouth: Mucous membranes are moist.  Eyes:     General:        Right eye: No discharge.        Left eye: No discharge.     Conjunctiva/sclera: Conjunctivae normal.     Pupils: Pupils are equal, round, and reactive to light.  Neck:     Thyroid: No thyromegaly.     Vascular: No JVD.  Cardiovascular:     Rate and Rhythm: Normal rate and regular rhythm.     Heart sounds: Normal heart sounds. No murmur Humphrey.   No friction rub. No gallop.  Pulmonary:     Effort: Pulmonary effort is normal.     Breath sounds: Normal breath sounds. No wheezing, rhonchi or rales.  Abdominal:     General: Bowel sounds are normal.     Palpations: Abdomen is soft. There is no mass.     Tenderness: There is no abdominal tenderness. There is no guarding or rebound.  Musculoskeletal:        General: Normal range of motion.     Cervical back: Normal range of motion and neck supple.  Lymphadenopathy:     Cervical: No cervical adenopathy.  Skin:    General: Skin is warm and dry.     Capillary Refill: Capillary refill takes less than 2 seconds.     Coloration: Skin is not jaundiced or pale.     Findings: No bruising, erythema, lesion or rash.  Neurological:     Mental Status: She is alert.     Deep Tendon  Reflexes: Reflexes are normal and symmetric.    Wt Readings from Last 3 Encounters:  03/11/21 187 lb (84.8 kg)  02/22/21 187 lb 11.2 oz (85.1 kg)  12/31/20 175 lb (79.4 kg)    BP 120/68   Pulse 68   Ht 5\' 5"  (1.651 m)   Wt 187 lb (84.8 kg)   BMI 31.12 kg/m   Assessment and Plan:  1. Reactive depression Chronic.  Controlled.  Stable.  PHQ was 3.  Patient will continue sertraline 50 mg once a day.  And alprazolam on a as needed basis. - ALPRAZolam (XANAX) 0.25 MG tablet; Take 1 tablet (0.25 mg total) by mouth daily as needed for anxiety.  Dispense: 30 tablet; Refill: 0 - sertraline (ZOLOFT) 50 MG tablet; Take 1 tablet (50 mg total) by mouth daily. Initiate one half tablet a day for 8 days then 1 a day  Dispense: 90 tablet; Refill: 1  2. Anxiety Chronic.  Controlled.  Stable.  Continue alprazolam 0.25 as needed for panic attack concerns. - ALPRAZolam (XANAX) 0.25 MG tablet; Take 1 tablet (0.25 mg total) by mouth daily as needed for anxiety.  Dispense: 30 tablet; Refill: 0 - sertraline (ZOLOFT) 50 MG tablet; Take 1 tablet (50 mg total) by mouth daily. Initiate one half tablet a day for 8 days then 1 a day  Dispense: 90 tablet; Refill: 1  3. Familial hypercholesterolemia Chronic.  Controlled.  Stable.  Continue atorvastatin 10 mg once a day.  Will check lipid panel for current LDL status. - atorvastatin (LIPITOR) 10 MG tablet; Take 1 tablet (10 mg total) by mouth daily.  Dispense: 90 tablet; Refill: 1 - Lipid Panel With LDL/HDL Ratio  4. Generalized edema Chronic.  Controlled.  Stable.  Continue hydrochlorothiazide 12.5 mg once a day. - hydrochlorothiazide (MICROZIDE) 12.5 MG capsule; TAKE 1 CAPSULE BY MOUTH  EVERY DAY  Dispense: 90 capsule; Refill: 1  5. Primary insomnia Chronic.  Controlled.  Stable.  Continue trazodone 50 mg 1/2 to 1 tablet nightly as needed. - traZODone (DESYREL) 50 MG tablet; TAKE 0.5-1 TABLETS BY MOUTH AT BEDTIME AS NEEDED FOR SLEEP.  Dispense: 90 tablet;  Refill: 1  6. Hypocalcemia Patient has had off-and-on mild hypocalcemia on renal function panel and will continue to follow with renal function panel. - Renal Function Panel

## 2021-03-12 ENCOUNTER — Encounter: Payer: Self-pay | Admitting: Oncology

## 2021-03-12 ENCOUNTER — Inpatient Hospital Stay: Payer: Managed Care, Other (non HMO) | Attending: Oncology | Admitting: Oncology

## 2021-03-12 ENCOUNTER — Ambulatory Visit: Payer: Managed Care, Other (non HMO) | Admitting: Family Medicine

## 2021-03-12 VITALS — BP 111/82 | HR 76 | Temp 96.8°F | Resp 18 | Wt 188.0 lb

## 2021-03-12 DIAGNOSIS — C50812 Malignant neoplasm of overlapping sites of left female breast: Secondary | ICD-10-CM | POA: Diagnosis present

## 2021-03-12 DIAGNOSIS — Z853 Personal history of malignant neoplasm of breast: Secondary | ICD-10-CM

## 2021-03-12 DIAGNOSIS — Z79810 Long term (current) use of selective estrogen receptor modulators (SERMs): Secondary | ICD-10-CM | POA: Diagnosis not present

## 2021-03-12 DIAGNOSIS — Z79899 Other long term (current) drug therapy: Secondary | ICD-10-CM | POA: Insufficient documentation

## 2021-03-12 DIAGNOSIS — Z170 Estrogen receptor positive status [ER+]: Secondary | ICD-10-CM | POA: Insufficient documentation

## 2021-03-12 DIAGNOSIS — Z08 Encounter for follow-up examination after completed treatment for malignant neoplasm: Secondary | ICD-10-CM | POA: Diagnosis not present

## 2021-03-12 NOTE — Progress Notes (Signed)
Hematology/Oncology Consult note [facility]  Telephone:([phone] [phone]  Patient Care Team: Juline Patch, MD as PCP - General (Family Medicine)   Name of the patient: Tiffany Humphrey  [id number]  28-May-1971   Date of visit: 03/12/21  Diagnosis-stage I ER positive left breast cancer in 2019 currently on tamoxifen  Chief complaint/ Reason for visit-routine follow-up of breast cancer  Heme/Onc history: patient is a 50- year-old female whose mother was recently diagnosed with DCIS.  She recently underwent a screening mammogram which showed abnormal calcifications in her left breast.  Diagnostic mammogram and ultrasound confirmed 9 x 5 x 5 mm calcifications.  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  There was a single focus worrisome but not diagnostic for microinvasion.  Patient has met with surgery and is tentatively scheduled to undergo lumpectomy and sentinel lymph node biopsy next week.  Patient states that she has used birth control for the last 25 years.  She used to have regular.  But about 5 months ago she did not have her menstrual cycles for about 5 months.  At that point her birth control was temporarily held and she did have her menstrual cycle after 5 months.  She did restart her birth control at that point.  She feels well today and denies any complaints.  Her appetite is good and she denies any unintentional weight loss. She is G1P1L1.  No other family history of breast cancer other than her mother had DCIS.   Patient had a biopsy on 06/22/2018.  Final pathology showed invasive mammary carcinoma 5 mm grade 2 ER greater than 90% positive PR 51 to 90% positive and HER-2/neu negative with negative margins.  Sentinel lymph node was negative for malignancy.  This was associated with high-grade DCIS.   oncotype score came back at 16. Plan is to proceed with OS plus AI.  However patient had significant headache with the first dose  of Lupron to the point that she could not function and headache persisted for almost a month.  Ovarian suppression was discontinued and patient was switched to tamoxifen since February 2020.  Adjuvant radiation treatment completed in January 2020.      Interval history-patient was seen a month ago for symptoms of swelling in the left axilla and concern for a left breast mass which prompted a bilateral diagnostic mammogram which was otherwise unremarkable.  Patient continues to have intermittent sensitivity in her left breast causing sharp shooting pain that comes and goes.  She still gets her menstrual cycles which are irregular and usually just spotting.  She is currently on tamoxifen.  She does have insomnia for which she is on trazodone  ECOG PS- 0 Pain scale- 0   Review of systems- Review of Systems  Constitutional:  Negative for chills, fever, malaise/fatigue and weight loss.  HENT:  Negative for congestion, ear discharge and nosebleeds.   Eyes:  Negative for blurred vision.  Respiratory:  Negative for cough, hemoptysis, sputum production, shortness of breath and wheezing.   Cardiovascular:  Negative for chest pain, palpitations, orthopnea and claudication.  Gastrointestinal:  Negative for abdominal pain, blood in stool, constipation, diarrhea, heartburn, melena, nausea and vomiting.  Genitourinary:  Negative for dysuria, flank pain, frequency, hematuria and urgency.  Musculoskeletal:  Negative for back pain, joint pain and myalgias.  Skin:  Negative for rash.  Neurological:  Negative for dizziness, tingling, focal weakness, seizures, weakness and headaches.  Endo/Heme/Allergies:  Does not bruise/bleed  easily.  Psychiatric/Behavioral:  Negative for depression and suicidal ideas. The patient does not have insomnia.      Allergies  Allergen Reactions   Quinolones Hives   Clindamycin Hives   Codeine Other (See Comments) and Palpitations    tachycardia     Past Medical History:   Diagnosis Date   Anxiety    Breast cancer ([place]) 06/2018   left breast   Cancer ([place])    LEFT BREAST    Complication of anesthesia    panic attacks   GERD (gastroesophageal reflux disease)    Headache    migraines/stress, optical migraines   History of kidney stones    Hypertension    Multilevel degenerative disc disease    Personal history of radiation therapy    PONV (postoperative nausea and vomiting)    Vertigo    last episode 10/2016-NO PROBLEMS SINCE SINUS SURGERY IN 2018   Wears contact lenses      Past Surgical History:  Procedure Laterality Date   BREAST BIOPSY Right 06/08/2016   CYSTIC PAPILLARY APOCRINE METAPLASIA.    BREAST BIOPSY Left 06/06/2018   left breast stereo X clip positive   BREAST EXCISIONAL BIOPSY Left 06/22/2018   lumpectomy with sn and nl   BREAST LUMPECTOMY Left 06/22/2018   CYSTOSTOMY Right 05/10/2019   Procedure: RIGHT OVARIAN CYSTOSTOMY;  Surgeon: Schermerhorn, Gwen Her, MD;  Location: [facility];  Service: Gynecology;  Laterality: Right;   FRONTAL SINUS EXPLORATION Bilateral 12/22/2016   Procedure: FRONTAL SINUS EXPLORATION;  Surgeon: Margaretha Sheffield, MD;  Location: [place];  Service: ENT;  Laterality: Bilateral;   IMAGE GUIDED SINUS SURGERY N/A 12/22/2016   Procedure: IMAGE GUIDED SINUS SURGERY;  Surgeon: Margaretha Sheffield, MD;  Location: [place];  Service: ENT;  Laterality: N/A;   LAPAROSCOPIC TUBAL LIGATION Bilateral 05/10/2019   Procedure: LAPAROSCOPIC TUBAL LIGATION, falope rings Right fallope ring  Left tubal cautery;  Surgeon: Boykin Nearing, MD;  Location: [facility];  Service: Gynecology;  Laterality: Bilateral;   MAXILLARY ANTROSTOMY Bilateral 12/22/2016   Procedure: MAXILLARY ANTROSTOMY;  Surgeon: Margaretha Sheffield, MD;  Location: [place];  Service: ENT;  Laterality: Bilateral;   PARTIAL MASTECTOMY WITH NEEDLE LOCALIZATION Left 06/22/2018   Procedure: PARTIAL MASTECTOMY WITH NEEDLE LOCALIZATION;  Surgeon:  Herbert Pun, MD;  Location: [facility];  Service: General;  Laterality: Left;   SENTINEL NODE BIOPSY Left 06/22/2018   Procedure: SENTINEL NODE BIOPSY;  Surgeon: Herbert Pun, MD;  Location: [facility];  Service: General;  Laterality: Left;   SEPTOPLASTY N/A 12/22/2016   Procedure: SEPTOPLASTY;  Surgeon: Margaretha Sheffield, MD;  Location: [place];  Service: ENT;  Laterality: N/A;  GAVE DISK TO CECE 4-5 KP   TURBINATE REDUCTION N/A 12/22/2016   Procedure: TURBINATE REDUCTION partial inferior;  Surgeon: Margaretha Sheffield, MD;  Location: [place];  Service: ENT;  Laterality: N/A;    Social History   Socioeconomic History   Marital status: Married    Spouse name: Not on file   Number of children: Not on file   Years of education: Not on file   Highest education level: Not on file  Occupational History   Not on file  Tobacco Use   Smoking status: Former    Packs/day: 1.00    Years: 15.00    Pack years: 15.00    Types: Cigarettes    Quit date: 2013    Years since quitting: 9.5   Smokeless tobacco: Never  Vaping Use   Vaping  Use: Former  Substance and Sexual Activity   Alcohol use: Yes    Alcohol/week: 2.0 standard drinks    Types: 1 Glasses of wine, 1 Cans of beer per week    Comment: social (4-6 drinks/month)   Drug use: No   Sexual activity: Yes  Other Topics Concern   Not on file  Social History Narrative   Not on file   Social Determinants of Health   Financial Resource Strain: Not on file  Food Insecurity: Not on file  Transportation Needs: Not on file  Physical Activity: Not on file  Stress: Not on file  Social Connections: Not on file  Intimate Partner Violence: Not on file    Family History  Problem Relation Age of Onset   Breast cancer Mother        dx 47s; 2nd primary at 93   Breast cancer Paternal Grandmother        dx 47s; deceased 28s   Breast cancer Paternal Aunt        dx 34s; currently 57s   Breast cancer Cousin         dx 21s; currently late 65s; daughter of a paternal uncle   Lung cancer Paternal Uncle        smoker; deceased     Current Outpatient Medications:    ALPRAZolam (XANAX) 0.25 MG tablet, Take 1 tablet (0.25 mg total) by mouth daily as needed for anxiety., Disp: 30 tablet, Rfl: 0   atorvastatin (LIPITOR) 10 MG tablet, Take 1 tablet (10 mg total) by mouth daily., Disp: 90 tablet, Rfl: 1   calcium-vitamin D (OSCAL WITH D) 500-200 MG-UNIT tablet, Take 1 tablet by mouth., Disp: , Rfl:    hydrochlorothiazide (MICROZIDE) 12.5 MG capsule, TAKE 1 CAPSULE BY MOUTH EVERY DAY, Disp: 90 capsule, Rfl: 1   levocetirizine (XYZAL) 5 MG tablet, Take 5 mg by mouth daily. , Disp: , Rfl:    omeprazole (PRILOSEC) 40 MG capsule, Take 40 mg by mouth daily., Disp: , Rfl:    sertraline (ZOLOFT) 50 MG tablet, Take 1 tablet (50 mg total) by mouth daily. Initiate one half tablet a day for 8 days then 1 a day, Disp: 90 tablet, Rfl: 1   tamoxifen (NOLVADEX) 20 MG tablet, TAKE 1 TABLET BY MOUTH EVERY DAY, Disp: 90 tablet, Rfl: 1   traMADol (ULTRAM) 50 MG tablet, Take 50 mg by mouth every 6 (six) hours as needed (pain)., Disp: , Rfl:    traZODone (DESYREL) 50 MG tablet, TAKE 0.5-1 TABLETS BY MOUTH AT BEDTIME AS NEEDED FOR SLEEP., Disp: 90 tablet, Rfl: 1   triamcinolone (NASACORT) 55 MCG/ACT AERO nasal inhaler, Place 1 spray into the nose daily., Disp: , Rfl:    vitamin B-12 (CYANOCOBALAMIN) 1000 MCG tablet, Take 1,000 mcg by mouth daily., Disp: , Rfl:    naproxen sodium (ALEVE) 220 MG tablet, Take 220-440 mg by mouth 2 (two) times daily as needed (pain.)., Disp: , Rfl:    NON FORMULARY, Place 3 drops under the tongue at bedtime. Allergy Oral Drops made by [facility], Disp: , Rfl:   Physical exam:  Vitals:   03/12/21 1058  BP: 111/82  Pulse: 76  Resp: 18  Temp: (!) 96.8 F (36 C)  TempSrc: Tympanic  SpO2: 100%  Weight: 188 lb (85.3 kg)   Physical Exam Constitutional:      General: She is not in acute  distress. Cardiovascular:     Rate and Rhythm: Normal rate and regular rhythm.  Heart sounds: Normal heart sounds.  Pulmonary:     Effort: Pulmonary effort is normal.     Breath sounds: Normal breath sounds.  Abdominal:     General: Bowel sounds are normal.     Palpations: Abdomen is soft.  Skin:    General: Skin is warm and dry.  Neurological:     Mental Status: She is alert and oriented to person, place, and time.    Breast exam was performed in seated and lying down position. Patient is status post right lumpectomy with a well-healed surgical scar.  At the end of the scar there is a palpable pea-sized nodule which appears to be consistent with chronic scarring.  No evidence of any palpable masses. No evidence of axillary adenopathy. No evidence of any palpable masses or lumps in the left breast. No evidence of leftt axillary adenopathy  CMP Latest Ref Rng & Units 09/03/2020  Glucose 70 - 99 mg/dL 99  BUN 6 - 20 mg/dL 10  Creatinine 0.44 - 1.00 mg/dL 0.80  Sodium 135 - 145 mmol/L 137  Potassium 3.5 - 5.1 mmol/L 3.8  Chloride 98 - 111 mmol/L 104  CO2 22 - 32 mmol/L 23  Calcium 8.9 - 10.3 mg/dL 8.8(L)  Total Protein 6.5 - 8.1 g/dL -  Total Bilirubin 0.3 - 1.2 mg/dL -  Alkaline Phos 38 - 126 U/L -  AST 15 - 41 U/L -  ALT 0 - 44 U/L -   CBC Latest Ref Rng & Units 08/24/2020  WBC 4.0 - 10.5 K/uL 10.0  Hemoglobin 12.0 - 15.0 g/dL 14.3  Hematocrit 36.0 - 46.0 % 41.6  Platelets 150 - 400 K/uL 318    No images are attached to the encounter.  US Breast Limited Uni Left Inc Axilla  Result Date: 03/09/2021 CLINICAL DATA:  Patient presents for bilateral diagnostic examination due to history of previous left malignant lumpectomy 2019. Patient states the lumpectomy site feels different along the medial aspect of the scar. Patient also states new palpable abnormality over the 5-6 o'clock position of the left breast. Patient's healthcare provider states possible palpable abnormality in  the right axilla. EXAM: DIGITAL DIAGNOSTIC BILATERAL MAMMOGRAM WITH TOMOSYNTHESIS AND CAD; [place] AXILLARY RIGHT; ULTRASOUND LEFT BREAST LIMITED TECHNIQUE: Bilateral digital diagnostic mammography and breast tomosynthesis was performed. The images were evaluated with computer-aided detection.; Targeted ultrasound examination of the right axilla was performed.; Targeted ultrasound examination of the left breast was performed COMPARISON:  Previous exam(s). ACR Breast Density Category b: There are scattered areas of fibroglandular density. FINDINGS: Examination demonstrates stable post lumpectomy changes with fat necrosis/oil cyst formation at the lumpectomy site. No new focal abnormality at or adjacent to the lumpectomy site to account for patient's palpable abnormality. No focal abnormality over the lower central left breast to account for patient's second left breast palpable abnormality. Left breast is otherwise unchanged. Right breast is unchanged. No focal abnormality seen in the right axilla to account for patient's palpable abnormality. Targeted ultrasound is performed, showing no focal abnormality over the left lumpectomy site to account for patient's palpable abnormality along the medial side of the lumpectomy scar. Typical postsurgical change/fat necrosis and oil cyst formation is seen at the lumpectomy site. No focal abnormality over the 5-6 o'clock position of the left breast to account for patient's second palpable left breast abnormality. Ultrasound the right axilla is normal with normal axillary lymph nodes. IMPRESSION: 1. Stable post lumpectomy site over the left upper outer quadrant without additional abnormality in this area  to account for patient's palpable abnormality. 2. No focal abnormality over the 5-6 o'clock position of the left breast to account for patient's second palpable left breast abnormality. 3. No focal abnormality over the right axilla to account for patient's palpable abnormality.  RECOMMENDATION: Recommend continued management of patient's bilateral palpable abnormalities on a clinical basis. Otherwise, recommend continued annual bilateral screening mammographic follow-up 1 year. I have discussed the findings and recommendations with the patient. If applicable, a reminder letter will be sent to the patient regarding the next appointment. BI-RADS CATEGORY  2: Benign. Electronically Signed   By: Marin Olp M.D.   On: 03/09/2021 12:13  MM DIAG BREAST TOMO BILATERAL  Result Date: 03/09/2021 CLINICAL DATA:  Patient presents for bilateral diagnostic examination due to history of previous left malignant lumpectomy 2019. Patient states the lumpectomy site feels different along the medial aspect of the scar. Patient also states new palpable abnormality over the 5-6 o'clock position of the left breast. Patient's healthcare provider states possible palpable abnormality in the right axilla. EXAM: DIGITAL DIAGNOSTIC BILATERAL MAMMOGRAM WITH TOMOSYNTHESIS AND CAD; [place] AXILLARY RIGHT; ULTRASOUND LEFT BREAST LIMITED TECHNIQUE: Bilateral digital diagnostic mammography and breast tomosynthesis was performed. The images were evaluated with computer-aided detection.; Targeted ultrasound examination of the right axilla was performed.; Targeted ultrasound examination of the left breast was performed COMPARISON:  Previous exam(s). ACR Breast Density Category b: There are scattered areas of fibroglandular density. FINDINGS: Examination demonstrates stable post lumpectomy changes with fat necrosis/oil cyst formation at the lumpectomy site. No new focal abnormality at or adjacent to the lumpectomy site to account for patient's palpable abnormality. No focal abnormality over the lower central left breast to account for patient's second left breast palpable abnormality. Left breast is otherwise unchanged. Right breast is unchanged. No focal abnormality seen in the right axilla to account for patient's palpable  abnormality. Targeted ultrasound is performed, showing no focal abnormality over the left lumpectomy site to account for patient's palpable abnormality along the medial side of the lumpectomy scar. Typical postsurgical change/fat necrosis and oil cyst formation is seen at the lumpectomy site. No focal abnormality over the 5-6 o'clock position of the left breast to account for patient's second palpable left breast abnormality. Ultrasound the right axilla is normal with normal axillary lymph nodes. IMPRESSION: 1. Stable post lumpectomy site over the left upper outer quadrant without additional abnormality in this area to account for patient's palpable abnormality. 2. No focal abnormality over the 5-6 o'clock position of the left breast to account for patient's second palpable left breast abnormality. 3. No focal abnormality over the right axilla to account for patient's palpable abnormality. RECOMMENDATION: Recommend continued management of patient's bilateral palpable abnormalities on a clinical basis. Otherwise, recommend continued annual bilateral screening mammographic follow-up 1 year. I have discussed the findings and recommendations with the patient. If applicable, a reminder letter will be sent to the patient regarding the next appointment. BI-RADS CATEGORY  2: Benign. Electronically Signed   By: Marin Olp M.D.   On: 03/09/2021 12:13  [place] AXILLA RIGHT  Result Date: 03/09/2021 CLINICAL DATA:  Patient presents for bilateral diagnostic examination due to history of previous left malignant lumpectomy 2019. Patient states the lumpectomy site feels different along the medial aspect of the scar. Patient also states new palpable abnormality over the 5-6 o'clock position of the left breast. Patient's healthcare provider states possible palpable abnormality in the right axilla. EXAM: DIGITAL DIAGNOSTIC BILATERAL MAMMOGRAM WITH TOMOSYNTHESIS AND CAD; [place] AXILLARY RIGHT; ULTRASOUND LEFT  BREAST LIMITED TECHNIQUE:  Bilateral digital diagnostic mammography and breast tomosynthesis was performed. The images were evaluated with computer-aided detection.; Targeted ultrasound examination of the right axilla was performed.; Targeted ultrasound examination of the left breast was performed COMPARISON:  Previous exam(s). ACR Breast Density Category b: There are scattered areas of fibroglandular density. FINDINGS: Examination demonstrates stable post lumpectomy changes with fat necrosis/oil cyst formation at the lumpectomy site. No new focal abnormality at or adjacent to the lumpectomy site to account for patient's palpable abnormality. No focal abnormality over the lower central left breast to account for patient's second left breast palpable abnormality. Left breast is otherwise unchanged. Right breast is unchanged. No focal abnormality seen in the right axilla to account for patient's palpable abnormality. Targeted ultrasound is performed, showing no focal abnormality over the left lumpectomy site to account for patient's palpable abnormality along the medial side of the lumpectomy scar. Typical postsurgical change/fat necrosis and oil cyst formation is seen at the lumpectomy site. No focal abnormality over the 5-6 o'clock position of the left breast to account for patient's second palpable left breast abnormality. Ultrasound the right axilla is normal with normal axillary lymph nodes. IMPRESSION: 1. Stable post lumpectomy site over the left upper outer quadrant without additional abnormality in this area to account for patient's palpable abnormality. 2. No focal abnormality over the 5-6 o'clock position of the left breast to account for patient's second palpable left breast abnormality. 3. No focal abnormality over the right axilla to account for patient's palpable abnormality. RECOMMENDATION: Recommend continued management of patient's bilateral palpable abnormalities on a clinical basis. Otherwise, recommend continued annual  bilateral screening mammographic follow-up 1 year. I have discussed the findings and recommendations with the patient. If applicable, a reminder letter will be sent to the patient regarding the next appointment. BI-RADS CATEGORY  2: Benign. Electronically Signed   By: Marin Olp M.D.   On: 03/09/2021 12:13    Assessment and plan- Patient is a 50 y.o. 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  Final pathology showed stage I apT1 apN0 cM0 invasive mammary carcinoma grade 2 ER/PR positive HER-2/neu negative s/p lumpectomy and adjuvant radiation treatment.  She did not require adjuvant chemotherapy.  She is currently on tamoxifen.  This is a routine follow-up visit for breast cancer  Patient had a diagnostic bilateral mammogram and ultrasound in July 2022 which did not show any evidence of suspicious masses or lymphadenopathy.  On my exam today I do not feel any palpable masses either.  She will need a repeat mammogram in July 2023.  Patient will continue taking tamoxifen for 5 years ending in February 2025.  I will see her back in 6 months   Visit Diagnosis 1. Encounter for follow-up surveillance of breast cancer      Dr. Randa Evens, MD, MPH [facility] [phone] 03/12/2021 4:19 PM

## 2021-03-12 NOTE — Progress Notes (Signed)
Follow up from [facility] and ultrasounds. Pt has  hot flashes, tamoxifen.

## 2021-03-15 ENCOUNTER — Other Ambulatory Visit: Payer: Self-pay | Admitting: Sports Medicine

## 2021-03-15 DIAGNOSIS — M25561 Pain in right knee: Secondary | ICD-10-CM

## 2021-03-19 LAB — LIPID PANEL WITH LDL/HDL RATIO
Cholesterol, Total: 172 mg/dL (ref 100–199)
HDL: 50 mg/dL (ref >39)
LDL Chol Calc (NIH): 103 mg/dL — ABNORMAL HIGH (ref 0–99)
LDL/HDL Ratio: 2.1 ratio (ref 0.0–3.2)
Triglycerides: 104 mg/dL (ref 0–149)
VLDL Cholesterol Cal: 19 mg/dL (ref 5–40)

## 2021-03-19 LAB — RENAL FUNCTION PANEL
Albumin: 4.6 g/dL (ref 3.8–4.8)
BUN/Creatinine Ratio: 11 (ref 9–23)
BUN: 10 mg/dL (ref 6–24)
CO2: 23 mmol/L (ref 20–29)
Calcium: 8.8 mg/dL (ref 8.7–10.2)
Chloride: 104 mmol/L (ref 96–106)
Creatinine, Ser: 0.91 mg/dL (ref 0.57–1.00)
Glucose: 89 mg/dL (ref 65–99)
Phosphorus: 3.5 mg/dL (ref 3.0–4.3)
Potassium: 4 mmol/L (ref 3.5–5.2)
Sodium: 143 mmol/L (ref 134–144)
eGFR: 77 mL/min/{1.73_m2} (ref >59)

## 2021-03-29 ENCOUNTER — Other Ambulatory Visit: Payer: Self-pay

## 2021-03-29 ENCOUNTER — Ambulatory Visit
Admission: RE | Admit: 2021-03-29 | Discharge: 2021-03-29 | Disposition: A | Discharge status: Home / Self Care | Payer: Managed Care, Other (non HMO) | Source: Ambulatory Visit | Attending: Sports Medicine | Admitting: Sports Medicine

## 2021-03-29 ENCOUNTER — Encounter: Payer: Self-pay | Admitting: Oncology

## 2021-03-29 DIAGNOSIS — M25561 Pain in right knee: Secondary | ICD-10-CM

## 2021-05-14 ENCOUNTER — Other Ambulatory Visit: Payer: Self-pay | Admitting: Oncology

## 2021-05-25 ENCOUNTER — Ambulatory Visit: Payer: Self-pay | Admitting: General Surgery

## 2021-05-25 NOTE — H&P (Signed)
HISTORY OF PRESENT ILLNESS:    Ms. Tiffany Humphrey is a 50 y.o.female patient who comes for evaluation of mammogram and persistent pain on left breast scar.    She initially found with a distortion in left breast mammogram.  This led to core needle biopsy.  Biopsy shows DCIS.  Patient had needle guided partial mastectomy and SLNBx for DCIS on October 2018. Final pathology came positive for invasive mammary carcinoma. Sentinel lymph node negative. Margins were clear. Patient followed up with Oncologist and Oncotype studies came back as low risk. She completed radiation therapy.   Currently antiestrogen therapy.   Patient reports feeling well.  She denies any problem with the breast.  She denies any palpable mass.  She denies any skin changes.    She had mammogram on July 2022.  This shows no concerning distortion, calcifications, asymmetries or masses.    There are expected postsurgical changes I personally evaluated the images.   Patient continued having persistent pain on the left breast scar.  She feels a palpable knot that is very tender to palpation.  Pain does not radiate to other part of the body.  Pain aggravated by applying pressure.  No alleviating factors.  PAST MEDICAL HISTORY:  Past Medical History:  Diagnosis Date   B12 deficiency    BRCA negative    Cancer (CMS-HCC) 10-06/2018   Breast-left, lumpectomy with lymph nodes removed on Tamoxifen   Cervical dysplasia    Migraines         PAST SURGICAL HISTORY:   Past Surgical History:  Procedure Laterality Date   BREAST EXCISIONAL BIOPSY     BREAST SURGERY Left 05/2018   partial mastectomy on Tamoxifen   BTL with right ovarian cystectomy  05/10/2019   FUNCTIONAL ENDOSCOPIC SINUS SURGERY     deviated septum    MASTECTOMY, PARTIAL Left 06/22/2018   Dr Lesli Albee -- w/ SN   OTHER SURGERY     cryotherapy, in her 20's   TUBAL LIGATION  05/10/2019   BTL right ovarian cystectomy         MEDICATIONS:  Outpatient Encounter  Medications as of 05/25/2021  Medication Sig Dispense Refill   ALPRAZolam (XANAX) 0.25 MG tablet Take by mouth as needed     calcium carbonate-vitamin D3 (OS-CAL 500+D) 500 mg(1,229m) -200 unit tablet Take by mouth once daily     cyanocobalamin, vitamin B-12, 2,500 mcg Subl Place under the tongue once daily     ezetimibe (ZETIA) 10 mg tablet Take 1 tablet by mouth once daily     hydroCHLOROthiazide (MICROZIDE) 12.5 mg capsule TAKE 1 CAPSULE BY MOUTH ONCE DAILY 90 capsule 1   ibuprofen (MOTRIN) 800 MG tablet TAKE 1 TABLET BY MOUTH EVERY 8 HOURS AS NEEDED FOR PAIN 30 tablet 0   meclizine (ANTIVERT) 25 mg tablet Take 25 mg by mouth 3 (three) times daily as needed     naproxen sodium (ALEVE) 220 MG tablet Take by mouth as needed     NON FORMULARY Place under the tongue 3 drops under toungue for allergies     omeprazole (PRILOSEC) 40 MG DR capsule TAKE 1 CAPSULE BY MOUTH EVERY DAY 90 capsule 3   sertraline (ZOLOFT) 50 MG tablet Take 1 tablet by mouth once daily     tamoxifen (NOLVADEX) 20 MG tablet Take 20 mg by mouth once daily     traMADol (ULTRAM) 50 mg tablet Take 1 tablet (50 mg total) by mouth every 6 (six) hours as needed for Pain 90 tablet  1   traZODone (DESYREL) 50 MG tablet Take by mouth nightly     triamcinolone (NASACORT AQ) 55 mcg nasal spray by Nasal route once daily     levocetirizine (XYZAL) 5 MG tablet Take 5 mg by mouth every evening (Patient not taking: Reported on 05/25/2021)     No facility-administered encounter medications on file as of 05/25/2021.     ALLERGIES:   Codeine and Quinolones   SOCIAL HISTORY:  Social History   Socioeconomic History   Marital status: Married  Tobacco Use   Smoking status: Former Smoker    Packs/day: 0.00    Years: 0.00    Pack years: 0.00   Smokeless tobacco: Never Used  Scientific laboratory technician Use: Never used  Substance and Sexual Activity   Alcohol use: Yes    Comment: Sometimes   Drug use: Never   Sexual activity: Yes    Partners:  Male    Birth control/protection: Surgical    FAMILY HISTORY:  Family History  Problem Relation Age of Onset   High blood pressure (Hypertension) Mother    Breast cancer Mother        13 yrs ago in l breast, currently has in r breast-estrogen related   Heart disease Father        myocardial infarction   Breast cancer Paternal Grandmother      GENERAL REVIEW OF SYSTEMS:   General ROS: negative for - chills, fatigue, fever, weight gain or weight loss Allergy and Immunology ROS: negative for - hives  Hematological and Lymphatic ROS: negative for - bleeding problems or bruising, negative for palpable nodes Endocrine ROS: negative for - heat or cold intolerance, hair changes Respiratory ROS: negative for - cough, shortness of breath or wheezing Cardiovascular ROS: no chest pain or palpitations GI ROS: negative for nausea, vomiting, abdominal pain, diarrhea, constipation Musculoskeletal ROS: negative for - joint swelling or muscle pain Neurological ROS: negative for - confusion, syncope Dermatological ROS: negative for pruritus and rash  PHYSICAL EXAM:  Vitals:   05/25/21 0946  BP: 122/82  Pulse: 82  .  Ht:165.1 cm (5' 5" ) Wt:77.1 kg (170 lb) ZJI:RCVE surface area is 1.88 meters squared. Body mass index is 28.29 kg/m.Marland Kitchen   GENERAL: Alert, active, oriented x3  HEENT: Pupils equal reactive to light. Extraocular movements are intact. Sclera clear. Palpebral conjunctiva normal red color.Pharynx clear.  NECK: Supple with no palpable mass and no adenopathy.  LUNGS: Sound clear with no rales rhonchi or wheezes.  HEART: Regular rhythm S1 and S2 without murmur.  ABDOMEN: Soft and depressible, nontender with no palpable mass, no hepatomegaly.   BREAST: Left breast scar with a palpable knot, tender to palpation.  No other palpable masses.  No axillary adenopathy.  Right breast without any palpable mass, skin changes, nipple retraction, nipple discharge.  There is no axillary  adenopathy.  EXTREMITIES: Well-developed well-nourished symmetrical with no dependent edema.  NEUROLOGICAL: Awake alert oriented, facial expression symmetrical, moving all extremities.      IMPRESSION:     Painful cutaneous scar [R52, L90.5] I think that this is a fat necrosis or small cyst on the scar.  This is very painful to palpation.  This is aggravating patient daily activities.  At this point I do think that this area is going to decrease or improve.  I discussed with the patient recommendation of excision of this palpable area to relieve the pain on the surgical area.   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  There is no architectural distortion opacifications.  I personally evaluated the images.   -Next mammogram July 2023            PLAN:  1.  Excision of painful scar (11403, 13101) 2.  Continue yearly mammogram  Patient verbalized understanding, all questions were answered, and were agreeable with the plan outlined above.   Herbert Pun, MD

## 2021-05-25 NOTE — H&P (View-Only) (Signed)
HISTORY OF PRESENT ILLNESS:    Ms. Icenhower is a 50 y.o.female patient who comes for evaluation of mammogram and persistent pain on left breast scar.    She initially found with a distortion in left breast mammogram.  This led to core needle biopsy.  Biopsy shows DCIS.  Patient had needle guided partial mastectomy and SLNBx for DCIS on October 2018. Final pathology came positive for invasive mammary carcinoma. Sentinel lymph node negative. Margins were clear. Patient followed up with Oncologist and Oncotype studies came back as low risk. She completed radiation therapy.   Currently antiestrogen therapy.   Patient reports feeling well.  She denies any problem with the breast.  She denies any palpable mass.  She denies any skin changes.    She had mammogram on July 2022.  This shows no concerning distortion, calcifications, asymmetries or masses.    There are expected postsurgical changes I personally evaluated the images.   Patient continued having persistent pain on the left breast scar.  She feels a palpable knot that is very tender to palpation.  Pain does not radiate to other part of the body.  Pain aggravated by applying pressure.  No alleviating factors.  PAST MEDICAL HISTORY:  Past Medical History:  Diagnosis Date   B12 deficiency    BRCA negative    Cancer (CMS-HCC) 10-06/2018   Breast-left, lumpectomy with lymph nodes removed on Tamoxifen   Cervical dysplasia    Migraines         PAST SURGICAL HISTORY:   Past Surgical History:  Procedure Laterality Date   BREAST EXCISIONAL BIOPSY     BREAST SURGERY Left 05/2018   partial mastectomy on Tamoxifen   BTL with right ovarian cystectomy  05/10/2019   FUNCTIONAL ENDOSCOPIC SINUS SURGERY     deviated septum    MASTECTOMY, PARTIAL Left 06/22/2018   Dr Lesli Albee -- w/ SN   OTHER SURGERY     cryotherapy, in her 20's   TUBAL LIGATION  05/10/2019   BTL right ovarian cystectomy         MEDICATIONS:  Outpatient Encounter  Medications as of 05/25/2021  Medication Sig Dispense Refill   ALPRAZolam (XANAX) 0.25 MG tablet Take by mouth as needed     calcium carbonate-vitamin D3 (OS-CAL 500+D) 500 mg(1,269m) -200 unit tablet Take by mouth once daily     cyanocobalamin, vitamin B-12, 2,500 mcg Subl Place under the tongue once daily     ezetimibe (ZETIA) 10 mg tablet Take 1 tablet by mouth once daily     hydroCHLOROthiazide (MICROZIDE) 12.5 mg capsule TAKE 1 CAPSULE BY MOUTH ONCE DAILY 90 capsule 1   ibuprofen (MOTRIN) 800 MG tablet TAKE 1 TABLET BY MOUTH EVERY 8 HOURS AS NEEDED FOR PAIN 30 tablet 0   meclizine (ANTIVERT) 25 mg tablet Take 25 mg by mouth 3 (three) times daily as needed     naproxen sodium (ALEVE) 220 MG tablet Take by mouth as needed     NON FORMULARY Place under the tongue 3 drops under toungue for allergies     omeprazole (PRILOSEC) 40 MG DR capsule TAKE 1 CAPSULE BY MOUTH EVERY DAY 90 capsule 3   sertraline (ZOLOFT) 50 MG tablet Take 1 tablet by mouth once daily     tamoxifen (NOLVADEX) 20 MG tablet Take 20 mg by mouth once daily     traMADol (ULTRAM) 50 mg tablet Take 1 tablet (50 mg total) by mouth every 6 (six) hours as needed for Pain 90 tablet  1   traZODone (DESYREL) 50 MG tablet Take by mouth nightly     triamcinolone (NASACORT AQ) 55 mcg nasal spray by Nasal route once daily     levocetirizine (XYZAL) 5 MG tablet Take 5 mg by mouth every evening (Patient not taking: Reported on 05/25/2021)     No facility-administered encounter medications on file as of 05/25/2021.     ALLERGIES:   Codeine and Quinolones   SOCIAL HISTORY:  Social History   Socioeconomic History   Marital status: Married  Tobacco Use   Smoking status: Former Smoker    Packs/day: 0.00    Years: 0.00    Pack years: 0.00   Smokeless tobacco: Never Used  Scientific laboratory technician Use: Never used  Substance and Sexual Activity   Alcohol use: Yes    Comment: Sometimes   Drug use: Never   Sexual activity: Yes    Partners:  Male    Birth control/protection: Surgical    FAMILY HISTORY:  Family History  Problem Relation Age of Onset   High blood pressure (Hypertension) Mother    Breast cancer Mother        13 yrs ago in l breast, currently has in r breast-estrogen related   Heart disease Father        myocardial infarction   Breast cancer Paternal Grandmother      GENERAL REVIEW OF SYSTEMS:   General ROS: negative for - chills, fatigue, fever, weight gain or weight loss Allergy and Immunology ROS: negative for - hives  Hematological and Lymphatic ROS: negative for - bleeding problems or bruising, negative for palpable nodes Endocrine ROS: negative for - heat or cold intolerance, hair changes Respiratory ROS: negative for - cough, shortness of breath or wheezing Cardiovascular ROS: no chest pain or palpitations GI ROS: negative for nausea, vomiting, abdominal pain, diarrhea, constipation Musculoskeletal ROS: negative for - joint swelling or muscle pain Neurological ROS: negative for - confusion, syncope Dermatological ROS: negative for pruritus and rash  PHYSICAL EXAM:  Vitals:   05/25/21 0946  BP: 122/82  Pulse: 82  .  Ht:165.1 cm (5' 5" ) Wt:77.1 kg (170 lb) BDZ:HGDJ surface area is 1.88 meters squared. Body mass index is 28.29 kg/m.Marland Kitchen   GENERAL: Alert, active, oriented x3  HEENT: Pupils equal reactive to light. Extraocular movements are intact. Sclera clear. Palpebral conjunctiva normal red color.Pharynx clear.  NECK: Supple with no palpable mass and no adenopathy.  LUNGS: Sound clear with no rales rhonchi or wheezes.  HEART: Regular rhythm S1 and S2 without murmur.  ABDOMEN: Soft and depressible, nontender with no palpable mass, no hepatomegaly.   BREAST: Left breast scar with a palpable knot, tender to palpation.  No other palpable masses.  No axillary adenopathy.  Right breast without any palpable mass, skin changes, nipple retraction, nipple discharge.  There is no axillary  adenopathy.  EXTREMITIES: Well-developed well-nourished symmetrical with no dependent edema.  NEUROLOGICAL: Awake alert oriented, facial expression symmetrical, moving all extremities.      IMPRESSION:     Painful cutaneous scar [R52, L90.5] I think that this is a fat necrosis or small cyst on the scar.  This is very painful to palpation.  This is aggravating patient daily activities.  At this point I do think that this area is going to decrease or improve.  I discussed with the patient recommendation of excision of this palpable area to relieve the pain on the surgical area.   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  There is no architectural distortion opacifications.  I personally evaluated the images.   -Next mammogram July 2023            PLAN:  1.  Excision of painful scar (11403, 13101) 2.  Continue yearly mammogram  Patient verbalized understanding, all questions were answered, and were agreeable with the plan outlined above.   Herbert Pun, MD

## 2021-05-27 ENCOUNTER — Other Ambulatory Visit: Payer: Self-pay

## 2021-05-27 ENCOUNTER — Other Ambulatory Visit
Admission: RE | Admit: 2021-05-27 | Discharge: 2021-05-27 | Disposition: A | Discharge status: Home / Self Care | Payer: Managed Care, Other (non HMO) | Source: Ambulatory Visit | Attending: General Surgery | Admitting: General Surgery

## 2021-05-27 NOTE — Patient Instructions (Addendum)
Your procedure is scheduled on: 05/31/2021  Report to the Registration Desk on the 1st floor of the [facility]. To find out your arrival time, please call [phone] between 1PM - 3PM on:05/28/2021   REMEMBER: Instructions that are not followed completely may result in serious medical risk, up to and including death; or upon the discretion of your surgeon and anesthesiologist your surgery may need to be rescheduled.  Do not eat food after midnight the night before surgery.  No gum chewing, lozengers or hard candies.  You may however, drink CLEAR liquids up to 2 hours before you are scheduled to arrive for your surgery. Do not drink anything within 2 hours of your scheduled arrival time.  Clear liquids include: - water  - apple juice without pulp - gatorade (not RED, PURPLE, OR BLUE) - black coffee or tea (Do NOT add milk or creamers to the coffee or tea) Do NOT drink anything that is not on this list.   TAKE THESE MEDICATIONS THE MORNING OF SURGERY WITH A SIP OF WATER:  Prilosec (take one the night before and one on the morning of surgery - helps to prevent nausea after surgery.) 2. Alprazolam 3. Atorvastatin 4.zetia 5. xyzAL 6. OMEPRAZOLE 7. ZOLOFT 8. TAMOXIFEN     One week prior to surgery: Stop Anti-inflammatories (NSAIDS) such as Advil, Aleve, Ibuprofen, Motrin, Naproxen, Naprosyn and Aspirin based products such as Excedrin, Goodys Powder, BC Powder. Stop ANY OVER THE COUNTER supplements until after surgery. You may however, continue to take Tylenol if needed for pain up until the day of surgery.  No Alcohol for 24 hours before or after surgery.  No Smoking including e-cigarettes for 24 hours prior to surgery.  No chewable tobacco products for at least 6 hours prior to surgery.  No nicotine patches on the day of surgery.  Do not use any "recreational" drugs for at least a week prior to your surgery.  Please be advised that the combination of cocaine and  anesthesia may have negative outcomes, up to and including death. If you test positive for cocaine, your surgery will be cancelled.  On the morning of surgery brush your teeth with toothpaste and water, you may rinse your mouth with mouthwash if you wish. Do not swallow any toothpaste or mouthwash.  Use CHG Soap or wipes as directed on instruction sheet.  Do not wear jewelry, make-up, hairpins, clips or nail polish.  Do not wear lotions, powders, or perfumes.   Do not shave body from the neck down 48 hours prior to surgery just in case you cut yourself which could leave a site for infection.  Also, freshly shaved skin may become irritated if using the CHG soap.  Contact lenses, hearing aids and dentures may not be worn into surgery.  Do not bring valuables to the hospital. [facility] is not responsible for any missing/lost belongings or valuables.   Notify your doctor if there is any change in your medical condition (cold, fever, infection).  Wear comfortable clothing (specific to your surgery type) to the hospital.  After surgery, you can help prevent lung complications by doing breathing exercises.  Take deep breaths and cough every 1-2 hours. Your doctor may order a device called an Incentive Spirometer to help you take deep breaths. When coughing or sneezing, hold a pillow firmly against your incision with both hands. This is called "splinting." Doing this helps protect your incision. It also decreases belly discomfort.  If you are being admitted to the  hospital overnight, leave your suitcase in the car. After surgery it may be brought to your room.  If you are being discharged the day of surgery, you will not be allowed to drive home. You will need a responsible adult (18 years or older) to drive you home and stay with you that night.   If you are taking public transportation, you will need to have a responsible adult (18 years or older) with you. Please confirm with your  physician that it is acceptable to use public transportation.   Please call the [facility] Dept. at [phone] if you have any questions about these instructions.  Surgery Visitation Policy:  Patients undergoing a surgery or procedure may have one family member or support person with them as long as that person is not COVID-19 positive or experiencing its symptoms.  That person may remain in the waiting area during the procedure and may rotate out with other people.  Inpatient Visitation:    Visiting hours are 7 a.m. to 8 p.m. Up to two visitors ages 16+ are allowed at one time in a patient room. The visitors may rotate out with other people during the day. Visitors must check out when they leave, or other visitors will not be allowed. One designated support person may remain overnight. The visitor must pass COVID-19 screenings, use hand sanitizer when entering and exiting the patient's room and wear a mask at all times, including in the patient's room. Patients must also wear a mask when staff or their visitor are in the room. Masking is required regardless of vaccination status.

## 2021-05-27 NOTE — Patient Instructions (Addendum)
Your procedure is scheduled on: 05/31/2021 Report to the Registration Desk on the 1st floor of the [facility]. To find out your arrival time, please call [phone] between 1PM - 3PM on: 05/27/2021   REMEMBER: Instructions that are not followed completely may result in serious medical risk, up to and including death; or upon the discretion of your surgeon and anesthesiologist your surgery may need to be rescheduled.  Do not eat food after midnight the night before surgery.  No gum chewing, lozengers or hard candies.  You may however, drink CLEAR liquids up to 2 hours before you are scheduled to arrive for your surgery. Do not drink anything within 2 hours of your scheduled arrival time.  Clear liquids include: - water  - apple juice without pulp - gatorade (not RED, PURPLE, OR BLUE) - black coffee or tea (Do NOT add milk or creamers to the coffee or tea) Do NOT drink anything that is not on this list.    TAKE THESE MEDICATIONS THE MORNING OF SURGERY WITH A SIP OF WATER:  Prilosec (take one the night before and one on the morning of surgery - helps to prevent nausea after surgery.) 2.  Xanax 3.  lipitor 4.  Xyzal 5. Zoloft 6. Tamoxifen   One week prior to surgery: Stop Anti-inflammatories (NSAIDS) such as Advil, Aleve, Ibuprofen, Motrin, Naproxen, Naprosyn and Aspirin based products such as Excedrin, Goodys Powder, BC Powder.  Stop ANY OVER THE COUNTER supplements until after surgery like calcium and Vitamin D,B12, You may however, continue to take Tylenol if needed for pain up until the day of surgery.  No Alcohol for 24 hours before or after surgery.  No Smoking including e-cigarettes for 24 hours prior to surgery.  No chewable tobacco products for at least 6 hours prior to surgery.  No nicotine patches on the day of surgery.  Do not use any "recreational" drugs for at least a week prior to your surgery.  Please be advised that the combination of cocaine and anesthesia  may have negative outcomes, up to and including death. If you test positive for cocaine, your surgery will be cancelled.  On the morning of surgery brush your teeth with toothpaste and water, you may rinse your mouth with mouthwash if you wish. Do not swallow any toothpaste or mouthwash.    Do not wear jewelry, make-up, hairpins, clips or nail polish.  Do not wear lotions, powders, or perfumes.   Do not shave body from the neck down 48 hours prior to surgery just in case you cut yourself which could leave a site for infection.  Also, freshly shaved skin may become irritated if using the CHG soap.  Contact lenses, hearing aids and dentures may not be worn into surgery.  Do not bring valuables to the hospital. [facility] is not responsible for any missing/lost belongings or valuables.    Notify your doctor if there is any change in your medical condition (cold, fever, infection).  Wear comfortable clothing (specific to your surgery type) to the hospital.  After surgery, you can help prevent lung complications by doing breathing exercises.  Take deep breaths and cough every 1-2 hours. Your doctor may order a device called an Incentive Spirometer to help you take deep breaths.  If you are being admitted to the hospital overnight, leave your suitcase in the car. After surgery it may be brought to your room.  If you are being discharged the day of surgery, you will not be allowed  to drive home. You will need a responsible adult (18 years or older) to drive you home and stay with you that night.   If you are taking public transportation, you will need to have a responsible adult (18 years or older) with you. Please confirm with your physician that it is acceptable to use public transportation.   Please call the [facility] Dept. at [phone] if you have any questions about these instructions.  Surgery Visitation Policy:  Patients undergoing a surgery or procedure may  have one family member or support person with them as long as that person is not COVID-19 positive or experiencing its symptoms.  That person may remain in the waiting area during the procedure and may rotate out with other people.  Inpatient Visitation:    Visiting hours are 7 a.m. to 8 p.m. Up to two visitors ages 16+ are allowed at one time in a patient room. The visitors may rotate out with other people during the day. Visitors must check out when they leave, or other visitors will not be allowed. One designated support person may remain overnight. The visitor must pass COVID-19 screenings, use hand sanitizer when entering and exiting the patient's room and wear a mask at all times, including in the patient's room. Patients must also wear a mask when staff or their visitor are in the room. Masking is required regardless of vaccination status.

## 2021-05-31 ENCOUNTER — Ambulatory Visit: Payer: Managed Care, Other (non HMO) | Admitting: Urgent Care

## 2021-05-31 ENCOUNTER — Encounter
Admission: RE | Disposition: A | Discharge status: Home / Self Care | Payer: Self-pay | Source: Ambulatory Visit | Attending: General Surgery

## 2021-05-31 ENCOUNTER — Ambulatory Visit
Admission: RE | Admit: 2021-05-31 | Discharge: 2021-05-31 | Disposition: A | Discharge status: Home / Self Care | Payer: Managed Care, Other (non HMO) | Source: Ambulatory Visit | Attending: General Surgery | Admitting: General Surgery

## 2021-05-31 ENCOUNTER — Other Ambulatory Visit: Payer: Self-pay

## 2021-05-31 ENCOUNTER — Encounter: Payer: Self-pay | Admitting: General Surgery

## 2021-05-31 DIAGNOSIS — Z803 Family history of malignant neoplasm of breast: Secondary | ICD-10-CM | POA: Diagnosis not present

## 2021-05-31 DIAGNOSIS — Z87891 Personal history of nicotine dependence: Secondary | ICD-10-CM | POA: Diagnosis not present

## 2021-05-31 DIAGNOSIS — Z923 Personal history of irradiation: Secondary | ICD-10-CM | POA: Diagnosis not present

## 2021-05-31 DIAGNOSIS — Z791 Long term (current) use of non-steroidal anti-inflammatories (NSAID): Secondary | ICD-10-CM | POA: Diagnosis not present

## 2021-05-31 DIAGNOSIS — I10 Essential (primary) hypertension: Secondary | ICD-10-CM | POA: Diagnosis not present

## 2021-05-31 DIAGNOSIS — N644 Mastodynia: Secondary | ICD-10-CM | POA: Insufficient documentation

## 2021-05-31 DIAGNOSIS — Z79810 Long term (current) use of selective estrogen receptor modulators (SERMs): Secondary | ICD-10-CM | POA: Insufficient documentation

## 2021-05-31 DIAGNOSIS — Z79899 Other long term (current) drug therapy: Secondary | ICD-10-CM | POA: Insufficient documentation

## 2021-05-31 DIAGNOSIS — Z853 Personal history of malignant neoplasm of breast: Secondary | ICD-10-CM | POA: Insufficient documentation

## 2021-05-31 DIAGNOSIS — Z885 Allergy status to narcotic agent status: Secondary | ICD-10-CM | POA: Diagnosis not present

## 2021-05-31 DIAGNOSIS — E669 Obesity, unspecified: Secondary | ICD-10-CM | POA: Diagnosis not present

## 2021-05-31 DIAGNOSIS — Z6828 Body mass index (BMI) 28.0-28.9, adult: Secondary | ICD-10-CM | POA: Diagnosis not present

## 2021-05-31 DIAGNOSIS — L905 Scar conditions and fibrosis of skin: Secondary | ICD-10-CM | POA: Insufficient documentation

## 2021-05-31 HISTORY — PX: MASS EXCISION: SHX2000

## 2021-05-31 HISTORY — PX: BREAST EXCISIONAL BIOPSY: SUR124

## 2021-05-31 LAB — POCT I-STAT, CHEM 8
BUN: 11 mg/dL (ref 6–20)
Calcium, Ion: 1.07 mmol/L — ABNORMAL LOW (ref 1.15–1.40)
Chloride: 102 mmol/L (ref 98–111)
Creatinine, Ser: 0.8 mg/dL (ref 0.44–1.00)
Glucose, Bld: 95 mg/dL (ref 70–99)
HCT: 45 % (ref 36.0–46.0)
Hemoglobin: 15.3 g/dL — ABNORMAL HIGH (ref 12.0–15.0)
Potassium: 3.4 mmol/L — ABNORMAL LOW (ref 3.5–5.1)
Sodium: 141 mmol/L (ref 135–145)
TCO2: 24 mmol/L (ref 22–32)

## 2021-05-31 LAB — POCT PREGNANCY, URINE: Preg Test, Ur: NEGATIVE

## 2021-05-31 SURGERY — EXCISION MASS
Anesthesia: General | Site: Breast | Laterality: Left | Wound class: Clean

## 2021-05-31 MED ORDER — MEPERIDINE HCL 25 MG/ML IJ SOLN: 6.2500 mg | INTRAMUSCULAR | Status: DC | PRN

## 2021-05-31 MED ORDER — KETOROLAC TROMETHAMINE 30 MG/ML IJ SOLN
INTRAMUSCULAR | Status: DC | PRN
  Administered 2021-05-31: 30 mg via INTRAVENOUS

## 2021-05-31 MED ORDER — OXYCODONE HCL 5 MG PO TABS
ORAL_TABLET | ORAL | Status: AC
  Filled 2021-05-31: qty 1

## 2021-05-31 MED ORDER — KETOROLAC TROMETHAMINE 30 MG/ML IJ SOLN
INTRAMUSCULAR | Status: AC
  Filled 2021-05-31: qty 1

## 2021-05-31 MED ORDER — FENTANYL CITRATE (PF) 100 MCG/2ML IJ SOLN
INTRAMUSCULAR | Status: DC | PRN
  Administered 2021-05-31: 50 ug via INTRAVENOUS
  Administered 2021-05-31: 25 ug via INTRAVENOUS

## 2021-05-31 MED ORDER — FENTANYL CITRATE (PF) 100 MCG/2ML IJ SOLN
INTRAMUSCULAR | Status: AC
  Filled 2021-05-31: qty 2

## 2021-05-31 MED ORDER — CEFAZOLIN SODIUM-DEXTROSE 2-4 GM/100ML-% IV SOLN
INTRAVENOUS | Status: AC
  Filled 2021-05-31: qty 100

## 2021-05-31 MED ORDER — TRAMADOL HCL 50 MG PO TABS: 50.0000 mg | ORAL_TABLET | Freq: Four times a day (QID) | ORAL | 0 refills | Status: DC | PRN

## 2021-05-31 MED ORDER — FENTANYL CITRATE (PF) 100 MCG/2ML IJ SOLN
INTRAMUSCULAR | Status: AC
  Administered 2021-05-31: 50 ug via INTRAVENOUS
  Filled 2021-05-31: qty 2

## 2021-05-31 MED ORDER — PHENYLEPHRINE HCL (PRESSORS) 10 MG/ML IV SOLN
INTRAVENOUS | Status: DC | PRN
  Administered 2021-05-31 (×2): 100 ug via INTRAVENOUS

## 2021-05-31 MED ORDER — CHLORHEXIDINE GLUCONATE 0.12 % MT SOLN: 15.0000 mL | Freq: Once | OROMUCOSAL | Status: AC

## 2021-05-31 MED ORDER — PROPOFOL 500 MG/50ML IV EMUL
INTRAVENOUS | Status: DC | PRN
  Administered 2021-05-31: 150 ug/kg/min via INTRAVENOUS

## 2021-05-31 MED ORDER — BUPIVACAINE-EPINEPHRINE (PF) 0.5% -1:200000 IJ SOLN
INTRAMUSCULAR | Status: AC
  Filled 2021-05-31: qty 30

## 2021-05-31 MED ORDER — CHLORHEXIDINE GLUCONATE 0.12 % MT SOLN
OROMUCOSAL | Status: AC
  Administered 2021-05-31: 15 mL via OROMUCOSAL
  Filled 2021-05-31: qty 15

## 2021-05-31 MED ORDER — PROPOFOL 10 MG/ML IV BOLUS
INTRAVENOUS | Status: DC | PRN
  Administered 2021-05-31: 20 mg via INTRAVENOUS
  Administered 2021-05-31: 40 mg via INTRAVENOUS

## 2021-05-31 MED ORDER — PROPOFOL 10 MG/ML IV BOLUS
INTRAVENOUS | Status: AC
  Filled 2021-05-31: qty 20

## 2021-05-31 MED ORDER — ORAL CARE MOUTH RINSE: 15.0000 mL | Freq: Once | OROMUCOSAL | Status: AC

## 2021-05-31 MED ORDER — CEFAZOLIN SODIUM-DEXTROSE 2-4 GM/100ML-% IV SOLN
2.0000 g | INTRAVENOUS | Status: AC
  Administered 2021-05-31: 2 g via INTRAVENOUS

## 2021-05-31 MED ORDER — ONDANSETRON HCL 4 MG/2ML IJ SOLN
INTRAMUSCULAR | Status: DC | PRN
  Administered 2021-05-31: 4 mg via INTRAVENOUS

## 2021-05-31 MED ORDER — OXYCODONE HCL 5 MG/5ML PO SOLN: 5.0000 mg | Freq: Once | ORAL | Status: AC | PRN

## 2021-05-31 MED ORDER — MIDAZOLAM HCL 2 MG/2ML IJ SOLN
INTRAMUSCULAR | Status: AC
  Filled 2021-05-31: qty 2

## 2021-05-31 MED ORDER — MIDAZOLAM HCL 2 MG/2ML IJ SOLN
INTRAMUSCULAR | Status: DC | PRN
  Administered 2021-05-31: 2 mg via INTRAVENOUS

## 2021-05-31 MED ORDER — BUPIVACAINE-EPINEPHRINE (PF) 0.5% -1:200000 IJ SOLN
INTRAMUSCULAR | Status: DC | PRN
  Administered 2021-05-31: 30 mL

## 2021-05-31 MED ORDER — ONDANSETRON HCL 4 MG/2ML IJ SOLN
INTRAMUSCULAR | Status: AC
  Filled 2021-05-31: qty 2

## 2021-05-31 MED ORDER — LACTATED RINGERS IV SOLN: INTRAVENOUS | Status: DC

## 2021-05-31 MED ORDER — ACETAMINOPHEN 10 MG/ML IV SOLN
INTRAVENOUS | Status: AC
  Filled 2021-05-31: qty 100

## 2021-05-31 MED ORDER — OXYCODONE HCL 5 MG PO TABS
5.0000 mg | ORAL_TABLET | Freq: Once | ORAL | Status: AC | PRN
  Administered 2021-05-31: 5 mg via ORAL

## 2021-05-31 MED ORDER — FENTANYL CITRATE (PF) 100 MCG/2ML IJ SOLN
25.0000 ug | INTRAMUSCULAR | Status: DC | PRN
  Administered 2021-05-31: 50 ug via INTRAVENOUS

## 2021-05-31 MED ORDER — PROMETHAZINE HCL 25 MG/ML IJ SOLN: 6.2500 mg | INTRAMUSCULAR | Status: DC | PRN

## 2021-05-31 SURGICAL SUPPLY — 45 items
ADH SKN CLS APL DERMABOND .7 (GAUZE/BANDAGES/DRESSINGS) ×1
APL PRP STRL LF DISP 70% ISPRP (MISCELLANEOUS) ×1
BLADE SURG 15 STRL LF DISP TIS (BLADE) ×1 IMPLANT
BLADE SURG 15 STRL SS (BLADE) ×2
CHLORAPREP W/TINT 26 (MISCELLANEOUS) ×2 IMPLANT
CNTNR SPEC 2.5X3XGRAD LEK (MISCELLANEOUS) ×1
CONT SPEC 4OZ STER OR WHT (MISCELLANEOUS) ×1
CONT SPEC 4OZ STRL OR WHT (MISCELLANEOUS) ×1
CONTAINER SPEC 2.5X3XGRAD LEK (MISCELLANEOUS) ×1 IMPLANT
DERMABOND ADVANCED (GAUZE/BANDAGES/DRESSINGS) ×1
DERMABOND ADVANCED .7 DNX12 (GAUZE/BANDAGES/DRESSINGS) ×1 IMPLANT
DEVICE DUBIN SPECIMEN MAMMOGRA (MISCELLANEOUS) ×2 IMPLANT
DRAPE LAPAROTOMY TRNSV 106X77 (MISCELLANEOUS) ×2 IMPLANT
ELECT CAUTERY BLADE TIP 2.5 (TIP) ×2
ELECT REM PT RETURN 9FT ADLT (ELECTROSURGICAL) ×2
ELECTRODE CAUTERY BLDE TIP 2.5 (TIP) ×1 IMPLANT
ELECTRODE REM PT RTRN 9FT ADLT (ELECTROSURGICAL) ×1 IMPLANT
GAUZE 4X4 16PLY ~~LOC~~+RFID DBL (SPONGE) ×2 IMPLANT
GLOVE SURG ENC MOIS LTX SZ6.5 (GLOVE) ×2 IMPLANT
GLOVE SURG UNDER POLY LF SZ6.5 (GLOVE) ×2 IMPLANT
GOWN STRL REUS W/ TWL LRG LVL3 (GOWN DISPOSABLE) ×3 IMPLANT
GOWN STRL REUS W/TWL LRG LVL3 (GOWN DISPOSABLE) ×6
KIT MARKER MARGIN INK (KITS) IMPLANT
KIT TURNOVER KIT A (KITS) ×2 IMPLANT
LABEL OR SOLS (LABEL) ×2 IMPLANT
MANIFOLD NEPTUNE II (INSTRUMENTS) ×2 IMPLANT
MARGIN MAP 10MM (MISCELLANEOUS) ×2 IMPLANT
MARKER MARGIN CORRECT CLIP (MARKER) IMPLANT
NEEDLE HYPO 25X1 1.5 SAFETY (NEEDLE) ×2 IMPLANT
PACK BASIN MINOR ARMC (MISCELLANEOUS) ×2 IMPLANT
RETRACTOR RING XSMALL (MISCELLANEOUS) IMPLANT
RTRCTR WOUND ALEXIS 13CM XS SH (MISCELLANEOUS)
SUT ETHILON 3-0 FS-10 30 BLK (SUTURE) ×2
SUT MNCRL 4-0 (SUTURE) ×2
SUT MNCRL 4-0 27XMFL (SUTURE) ×1
SUT SILK 2 0 SH (SUTURE) ×2 IMPLANT
SUT VIC AB 3-0 SH 27 (SUTURE) ×2
SUT VIC AB 3-0 SH 27X BRD (SUTURE) ×1 IMPLANT
SUTURE EHLN 3-0 FS-10 30 BLK (SUTURE) ×1 IMPLANT
SUTURE MNCRL 4-0 27XMF (SUTURE) ×1 IMPLANT
SYR 10ML LL (SYRINGE) ×2 IMPLANT
SYR BULB IRRIG 60ML STRL (SYRINGE) ×2 IMPLANT
TRAP NEPTUNE SPECIMEN COLLECT (MISCELLANEOUS) ×2 IMPLANT
WATER STERILE IRR 1000ML POUR (IV SOLUTION) ×2 IMPLANT
WATER STERILE IRR 500ML POUR (IV SOLUTION) ×2 IMPLANT

## 2021-05-31 NOTE — Interval H&P Note (Signed)
History and Physical Interval Note:  05/31/2021 11:28 AM  Tiffany Humphrey  has presented today for surgery, with the diagnosis of L90.5 painful cutaneous scar.  The various methods of treatment have been discussed with the patient and family. After consideration of risks, benefits and other options for treatment, the patient has consented to  Procedure(s): EXCISION MASS (scar) (Left) as a surgical intervention.  The patient's history has been reviewed, patient examined, no change in status, stable for surgery.  I have reviewed the patient's chart and labs.  Questions were answered to the patient's satisfaction.     Herbert Pun

## 2021-05-31 NOTE — Anesthesia Preprocedure Evaluation (Signed)
Anesthesia Evaluation  Patient identified by MRN, date of birth, ID band Patient awake    Reviewed: Allergy & Precautions, NPO status , Patient's Chart, lab work & pertinent test results  History of Anesthesia Complications (+) PONV and history of anesthetic complications  Airway Mallampati: II  TM Distance: >3 FB Neck ROM: Full    Dental no notable dental hx.    Pulmonary neg sleep apnea, neg COPD, former smoker,    breath sounds clear to auscultation- rhonchi (-) wheezing      Cardiovascular Exercise Tolerance: Good hypertension, Pt. on medications (-) CAD, (-) Past MI, (-) Cardiac Stents and (-) CABG  Rhythm:Regular Rate:Normal - Systolic murmurs and - Diastolic murmurs    Neuro/Psych  Headaches, neg Seizures PSYCHIATRIC DISORDERS Anxiety    GI/Hepatic Neg liver ROS, GERD  ,  Endo/Other  negative endocrine ROSneg diabetes  Renal/GU negative Renal ROS     Musculoskeletal  (+) Arthritis ,   Abdominal (+) - obese,   Peds  Hematology negative hematology ROS (+)   Anesthesia Other Findings Past Medical History: No date: Anxiety 06/2018: Breast cancer ([place])     Comment:  left breast No date: Cancer ([place])     Comment:  LEFT BREAST  No date: Complication of anesthesia     Comment:  panic attacks No date: GERD (gastroesophageal reflux disease) No date: Headache     Comment:  migraines/stress, optical migraines No date: History of kidney stones No date: Hypertension No date: Multilevel degenerative disc disease No date: Personal history of radiation therapy No date: PONV (postoperative nausea and vomiting) No date: Vertigo     Comment:  last episode 10/2016-NO PROBLEMS SINCE SINUS SURGERY IN               2018 No date: Wears contact lenses   Reproductive/Obstetrics                             Anesthesia Physical Anesthesia Plan  ASA: 2  Anesthesia Plan: General   Post-op Pain  Management:    Induction: Intravenous  PONV Risk Score and Plan: 3 and Propofol infusion and TIVA  Airway Management Planned: Natural Airway  Additional Equipment:   Intra-op Plan:   Post-operative Plan:   Informed Consent: I have reviewed the patients History and Physical, chart, labs and discussed the procedure including the risks, benefits and alternatives for the proposed anesthesia with the patient or authorized representative who has indicated his/her understanding and acceptance.     Dental advisory given  Plan Discussed with: CRNA and Anesthesiologist  Anesthesia Plan Comments:         Anesthesia Quick Evaluation

## 2021-05-31 NOTE — Transfer of Care (Signed)
Immediate Anesthesia Transfer of Care Note  Patient: Tiffany Humphrey  Procedure(s) Performed: EXCISION MASS (scar) (Left: Breast)  Patient Location: PACU  Anesthesia Type:MAC  Level of Consciousness: awake and patient cooperative  Airway & Oxygen Therapy: Patient Spontanous Breathing  Post-op Assessment: Report given to RN and Post -op Vital signs reviewed and stable  Post vital signs: Reviewed and stable  Last Vitals:  Vitals Value Taken Time  BP 104/54 05/31/21 1224  Temp    Pulse 96 05/31/21 1226  Resp 20 05/31/21 1226  SpO2 100 % 05/31/21 1226  Vitals shown include unvalidated device data.  Last Pain:  Vitals:   05/31/21 1031  TempSrc: Oral  PainSc: 0-No pain         Complications: No notable events documented.

## 2021-05-31 NOTE — Anesthesia Postprocedure Evaluation (Signed)
Anesthesia Post Note  Patient: Tiffany Humphrey  Procedure(s) Performed: EXCISION MASS (scar) (Left: Breast)  Patient location during evaluation: PACU Anesthesia Type: General Level of consciousness: awake and alert and oriented Pain management: pain level controlled Vital Signs Assessment: post-procedure vital signs reviewed and stable Respiratory status: spontaneous breathing, nonlabored ventilation and respiratory function stable Cardiovascular status: blood pressure returned to baseline and stable Postop Assessment: no signs of nausea or vomiting Anesthetic complications: no   No notable events documented.   Last Vitals:  Vitals:   05/31/21 1245 05/31/21 1307  BP: 129/81 123/81  Pulse: 79 (!) 101  Resp: 16 16  Temp:  (!) 36.2 C  SpO2: 100% 100%    Last Pain:  Vitals:   05/31/21 1307  TempSrc: Temporal  PainSc: 5                  Kaley Jutras

## 2021-05-31 NOTE — Op Note (Signed)
Operation Report  Pre Operative Diagnosis: Left breast painful scar  Post operative diagnosis: Same  Anesthesia: Local and sedation   Surgeon: Dr. Windell Moment   Indication: This 50 y.o. year old female with a soft tissue mass that is causing pain on palpation.    Description of procedure: after orienting patient about the procedure steps and benefits and patient agreed to proceed. Time out was done identifying correct patient and location of procedure. Local anesthesia was infiltrated around the palpable lesion. With a blade #15, an elliptical incision was made using the skin lines. Sharp dissection was carried down and lesion was excised including dermal tissue. The mass measured 3 cm. Deep dermal stitches were done with vicryl 4-0 to repair the laceration and skin closed with Monocryl 4-0 in subcuticular fashion. Specimen sent to pathology.    Complications: none   EBL: minimal

## 2021-05-31 NOTE — Discharge Instructions (Addendum)
  Diet: Resume home heart healthy regular diet.   Activity: Increase activity as tolerated. Light activity and walking are encouraged. Do not drive or drink alcohol if taking narcotic pain medications.  Wound care: May shower with soapy water and pat dry (do not rub incisions), but no baths or submerging incision underwater until follow-up. (no swimming)   Medications: Resume all home medications. For mild to moderate pain: acetaminophen (Tylenol) or ibuprofen (if no kidney disease). Combining Tylenol with alcohol can substantially increase your risk of causing liver disease. Narcotic pain medications, if prescribed, can be used for severe pain, though may cause nausea, constipation, and drowsiness. If you do not need the narcotic pain medication, you do not need to fill the prescription.  Call office ([phone]) at any time if any questions, worsening pain, fevers/chills, bleeding, drainage from incision site, or other concerns.  AMBULATORY SURGERY  DISCHARGE INSTRUCTIONS   The drugs that you were given will stay in your system until tomorrow so for the next 24 hours you should not:  Drive an automobile Make any legal decisions Drink any alcoholic beverage   You may resume regular meals tomorrow.  Today it is better to start with liquids and gradually work up to solid foods.  You may eat anything you prefer, but it is better to start with liquids, then soup and crackers, and gradually work up to solid foods.   Please notify your doctor immediately if you have any unusual bleeding, trouble breathing, redness and pain at the surgery site, drainage, fever, or pain not relieved by medication.    Additional Instructions:  Please contact your physician with any problems or Same Day Surgery at [phone], Monday through Friday 6 am to 4 pm, or [facility] [place] Main number at [phone].  

## 2021-06-02 LAB — SURGICAL PATHOLOGY

## 2021-06-18 ENCOUNTER — Encounter: Payer: Self-pay | Admitting: General Surgery

## 2021-09-06 ENCOUNTER — Encounter: Payer: Self-pay | Admitting: Oncology

## 2021-09-12 ENCOUNTER — Encounter: Payer: Self-pay | Admitting: Oncology

## 2021-09-13 ENCOUNTER — Encounter: Payer: Self-pay | Admitting: Family Medicine

## 2021-09-13 ENCOUNTER — Ambulatory Visit: Payer: Managed Care, Other (non HMO) | Admitting: Family Medicine

## 2021-09-13 ENCOUNTER — Other Ambulatory Visit: Payer: Self-pay

## 2021-09-13 VITALS — BP 138/88 | HR 60 | Ht 65.0 in | Wt 179.0 lb

## 2021-09-13 DIAGNOSIS — R601 Generalized edema: Secondary | ICD-10-CM

## 2021-09-13 DIAGNOSIS — R030 Elevated blood-pressure reading, without diagnosis of hypertension: Secondary | ICD-10-CM | POA: Diagnosis not present

## 2021-09-13 DIAGNOSIS — F329 Major depressive disorder, single episode, unspecified: Secondary | ICD-10-CM | POA: Diagnosis not present

## 2021-09-13 DIAGNOSIS — F5101 Primary insomnia: Secondary | ICD-10-CM

## 2021-09-13 DIAGNOSIS — E7801 Familial hypercholesterolemia: Secondary | ICD-10-CM | POA: Diagnosis not present

## 2021-09-13 DIAGNOSIS — T753XXA Motion sickness, initial encounter: Secondary | ICD-10-CM

## 2021-09-13 DIAGNOSIS — F419 Anxiety disorder, unspecified: Secondary | ICD-10-CM

## 2021-09-13 MED ORDER — TRAZODONE HCL 50 MG PO TABS: 50.0000 mg | ORAL_TABLET | Freq: Every day | ORAL | 1 refills | Status: DC

## 2021-09-13 MED ORDER — SCOPOLAMINE 1 MG/3DAYS TD PT72: 1.0000 | MEDICATED_PATCH | TRANSDERMAL | 0 refills | Status: DC

## 2021-09-13 MED ORDER — SERTRALINE HCL 50 MG PO TABS: 50.0000 mg | ORAL_TABLET | Freq: Every day | ORAL | 1 refills | Status: DC

## 2021-09-13 MED ORDER — HYDROCHLOROTHIAZIDE 12.5 MG PO CAPS: ORAL_CAPSULE | ORAL | 1 refills | Status: DC

## 2021-09-13 MED ORDER — ATORVASTATIN CALCIUM 10 MG PO TABS: 10.0000 mg | ORAL_TABLET | Freq: Every day | ORAL | 1 refills | Status: DC

## 2021-09-13 NOTE — Progress Notes (Signed)
Date:  09/13/2021   Name:  Tiffany Humphrey   DOB:  [birth year]   MRN:  [id number]   Chief Complaint: Insomnia, Depression, Hypertension, Hyperlipidemia, and motion sickness  Insomnia Primary symptoms: no fragmented sleep, no sleep disturbance, no difficulty falling asleep, no somnolence, no frequent awakening, no premature morning awakening, no malaise/fatigue, no napping.   The current episode started more than one year. The onset quality is gradual. The problem has been gradually improving since onset. Past treatments include medication. The treatment provided moderate relief. PMH includes: hypertension, depression.   Depression        This is a chronic problem.  The current episode started more than 1 year ago.   The onset quality is gradual.   The problem occurs intermittently.  The problem has been gradually improving since onset.  Associated symptoms include insomnia.  Associated symptoms include no decreased concentration, no fatigue, no helplessness, no hopelessness, not irritable, no restlessness, no decreased interest, no appetite change, no body aches, no myalgias, no headaches, no indigestion, not sad and no suicidal ideas.     The symptoms are aggravated by medication.  Past treatments include SSRIs - Selective serotonin reuptake inhibitors.  Compliance with treatment is variable.  Previous treatment provided moderate relief.   Pertinent negatives include no hypothyroidism and no anxiety. Hypertension This is a chronic problem. The current episode started more than 1 year ago. The problem has been gradually improving since onset. The problem is controlled. Pertinent negatives include no anxiety, blurred vision, chest pain, headaches, malaise/fatigue, neck pain, orthopnea, palpitations, peripheral edema, PND, shortness of breath or sweats. There are no associated agents to hypertension. Risk factors for coronary artery disease include dyslipidemia. Past treatments include diuretics. The  current treatment provides moderate improvement. There are no compliance problems.  There is no history of angina, kidney disease, CAD/MI, CVA, heart failure, left ventricular hypertrophy, PVD or retinopathy. There is no history of chronic renal disease, a hypertension causing med or renovascular disease.  Hyperlipidemia This is a chronic problem. The current episode started more than 1 year ago. The problem is controlled. Recent lipid tests were reviewed and are normal. She has no history of chronic renal disease, diabetes, hypothyroidism, liver disease, obesity or nephrotic syndrome. Pertinent negatives include no chest pain, focal weakness, myalgias or shortness of breath. Current antihyperlipidemic treatment includes statins. The current treatment provides moderate improvement of lipids. There are no compliance problems.  Risk factors for coronary artery disease include dyslipidemia and hypertension.   Lab Results  Component Value Date   NA 141 05/31/2021   K 3.4 (L) 05/31/2021   CO2 23 03/18/2021   GLUCOSE 95 05/31/2021   BUN 11 05/31/2021   CREATININE 0.80 05/31/2021   CALCIUM 8.8 03/18/2021   EGFR 77 03/18/2021   GFRNONAA >60 09/03/2020   Lab Results  Component Value Date   CHOL 172 03/18/2021   HDL 50 03/18/2021   LDLCALC 103 (H) 03/18/2021   TRIG 104 03/18/2021   Lab Results  Component Value Date   TSH 1.730 07/01/2019   No results found for: HGBA1C Lab Results  Component Value Date   WBC 10.0 08/24/2020   HGB 15.3 (H) 05/31/2021   HCT 45.0 05/31/2021   MCV 86.8 08/24/2020   PLT 318 08/24/2020   Lab Results  Component Value Date   ALT 28 08/24/2020   AST 29 08/24/2020   ALKPHOS 52 08/24/2020   BILITOT 0.6 08/24/2020   No results found for: [place], 25OHVITD3,  VD25OH   Review of Systems  Constitutional:  Negative for appetite change, fatigue and malaise/fatigue.  Eyes:  Negative for blurred vision.  Respiratory:  Negative for shortness of breath.    Cardiovascular:  Negative for chest pain, palpitations, orthopnea and PND.  Musculoskeletal:  Negative for myalgias and neck pain.  Neurological:  Negative for focal weakness and headaches.  Psychiatric/Behavioral:  Positive for depression. Negative for decreased concentration, sleep disturbance and suicidal ideas. The patient has insomnia.    Patient Active Problem List   Diagnosis Date Noted   Lymphedema of breast 01/29/2019   Genetic testing 01/14/2019   Malignant neoplasm of upper-outer quadrant of left breast in female, estrogen receptor positive ([place]) 07/06/2018   Breast cancer ([place]) 06/18/2018   Migraines 08/03/2015    Allergies  Allergen Reactions   Quinolones Hives   Clindamycin Hives   Codeine Other (See Comments) and Palpitations    tachycardia    Past Surgical History:  Procedure Laterality Date   BREAST BIOPSY Right 06/08/2016   CYSTIC PAPILLARY APOCRINE METAPLASIA.    BREAST BIOPSY Left 06/06/2018   left breast stereo X clip positive   BREAST EXCISIONAL BIOPSY Left 06/22/2018   lumpectomy with sn and nl   BREAST LUMPECTOMY Left 06/22/2018   CYSTOSTOMY Right 05/10/2019   Procedure: RIGHT OVARIAN CYSTOSTOMY;  Surgeon: Schermerhorn, Gwen Her, MD;  Location: [facility];  Service: Gynecology;  Laterality: Right;   FRONTAL SINUS EXPLORATION Bilateral 12/22/2016   Procedure: FRONTAL SINUS EXPLORATION;  Surgeon: Margaretha Sheffield, MD;  Location: [facility];  Service: ENT;  Laterality: Bilateral;   IMAGE GUIDED SINUS SURGERY N/A 12/22/2016   Procedure: IMAGE GUIDED SINUS SURGERY;  Surgeon: Margaretha Sheffield, MD;  Location: [facility];  Service: ENT;  Laterality: N/A;   LAPAROSCOPIC TUBAL LIGATION Bilateral 05/10/2019   Procedure: LAPAROSCOPIC TUBAL LIGATION, falope rings Right fallope ring  Left tubal cautery;  Surgeon: Boykin Nearing, MD;  Location: [facility];  Service: Gynecology;  Laterality: Bilateral;   MASS EXCISION Left 05/31/2021   Procedure: EXCISION  MASS (scar);  Surgeon: Herbert Pun, MD;  Location: [facility];  Service: General;  Laterality: Left;   MAXILLARY ANTROSTOMY Bilateral 12/22/2016   Procedure: MAXILLARY ANTROSTOMY;  Surgeon: Margaretha Sheffield, MD;  Location: [facility];  Service: ENT;  Laterality: Bilateral;   PARTIAL MASTECTOMY WITH NEEDLE LOCALIZATION Left 06/22/2018   Procedure: PARTIAL MASTECTOMY WITH NEEDLE LOCALIZATION;  Surgeon: Herbert Pun, MD;  Location: [facility];  Service: General;  Laterality: Left;   SENTINEL NODE BIOPSY Left 06/22/2018   Procedure: SENTINEL NODE BIOPSY;  Surgeon: Herbert Pun, MD;  Location: [facility];  Service: General;  Laterality: Left;   SEPTOPLASTY N/A 12/22/2016   Procedure: SEPTOPLASTY;  Surgeon: Margaretha Sheffield, MD;  Location: [facility];  Service: ENT;  Laterality: N/A;  GAVE DISK TO [facility] 4-5 KP   TURBINATE REDUCTION N/A 12/22/2016   Procedure: TURBINATE REDUCTION partial inferior;  Surgeon: Margaretha Sheffield, MD;  Location: [facility];  Service: ENT;  Laterality: N/A;    Social History   Tobacco Use   Smoking status: Former    Packs/day: 1.00    Years: 15.00    Pack years: 15.00    Types: Cigarettes    Quit date: 2013    Years since quitting: 10.0   Smokeless tobacco: Never  Vaping Use   Vaping Use: Former  Substance Use Topics   Alcohol use: Yes    Alcohol/week: 2.0 standard drinks    Types: 1 Glasses of  wine, 1 Cans of beer per week    Comment: social (4-6 drinks/month)   Drug use: No     Medication list has been reviewed and updated.  Current Meds  Medication Sig   ALPRAZolam (XANAX) 0.25 MG tablet Take 1 tablet (0.25 mg total) by mouth daily as needed for anxiety. (Patient taking differently: Take 0.25 mg by mouth daily.)   atorvastatin (LIPITOR) 10 MG tablet Take 1 tablet (10 mg total) by mouth daily.   calcium-vitamin D (OSCAL WITH D) 500-200 MG-UNIT tablet Take 1 tablet by mouth.   Cyanocobalamin (B-12) 2500 MCG SUBL Place  2,500 mcg under the tongue daily.   ezetimibe (ZETIA) 10 MG tablet Take 10 mg by mouth daily.   hydrochlorothiazide (MICROZIDE) 12.5 MG capsule TAKE 1 CAPSULE BY MOUTH EVERY DAY   ibuprofen (ADVIL) 800 MG tablet Take 800 mg by mouth every 8 (eight) hours as needed for headache.   levocetirizine (XYZAL) 5 MG tablet Take 5 mg by mouth daily.    meclizine (ANTIVERT) 25 MG tablet Take 25 mg by mouth 3 (three) times daily as needed for dizziness.   naproxen sodium (ALEVE) 220 MG tablet Take 440 mg by mouth 2 (two) times daily as needed (pain.).   NON FORMULARY Place 3 drops under the tongue at bedtime. Allergy Oral Drops made by [facility]   omeprazole (PRILOSEC) 40 MG capsule Take 40 mg by mouth daily.   sertraline (ZOLOFT) 50 MG tablet Take 1 tablet (50 mg total) by mouth daily. Initiate one half tablet a day for 8 days then 1 a day (Patient taking differently: Take 50 mg by mouth daily.)   tamoxifen (NOLVADEX) 20 MG tablet TAKE 1 TABLET BY MOUTH EVERY DAY   traMADol (ULTRAM) 50 MG tablet Take 50 mg by mouth every 6 (six) hours as needed (pain).   traMADol (ULTRAM) 50 MG tablet Take 1 tablet (50 mg total) by mouth every 6 (six) hours as needed.   traZODone (DESYREL) 50 MG tablet TAKE 0.5-1 TABLETS BY MOUTH AT BEDTIME AS NEEDED FOR SLEEP. (Patient taking differently: Take 50 mg by mouth at bedtime.)   triamcinolone (NASACORT) 55 MCG/ACT AERO nasal inhaler Place 1 spray into the nose daily.    PHQ 2/9 Scores 09/13/2021 03/11/2021 07/15/2020 01/31/2020  PHQ - 2 Score 0 0 0 0  PHQ- 9 Score 0 3 2 6     GAD 7 : Generalized Anxiety Score 09/13/2021 03/11/2021 07/15/2020 01/31/2020  Nervous, Anxious, on Edge 0 0 0 0  Control/stop worrying 0 0 0 0  Worry too much - different things 0 0 0 0  Trouble relaxing 0 0 1 0  Restless 0 0 0 0  Easily annoyed or irritable 0 0 2 0  Afraid - awful might happen 0 0 0 0  Total GAD 7 Score 0 0 3 0  Anxiety Difficulty Not difficult at all - Not difficult at all Not  difficult at all    BP Readings from Last 3 Encounters:  09/13/21 138/88  05/31/21 99/66  03/12/21 111/82    Physical Exam Constitutional:      General: She is not irritable.   Wt Readings from Last 3 Encounters:  09/13/21 179 lb (81.2 kg)  05/31/21 170 lb (77.1 kg)  05/27/21 170 lb (77.1 kg)    BP 138/88    Pulse 60    Ht 5' 5"  (1.651 m)    Wt 179 lb (81.2 kg)    BMI 29.79 kg/m   Assessment  and Plan:  1. Familial hypercholesterolemia Chronic.  Controlled.  Stable.  Continue atorvastatin 10 mg once a day.  We will recheck lipid panel for current status of LDL. - atorvastatin (LIPITOR) 10 MG tablet; Take 1 tablet (10 mg total) by mouth daily.  Dispense: 90 tablet; Refill: 1 - Lipid Panel With LDL/HDL Ratio  2. Elevated blood pressure reading without diagnosis of hypertension Chronic.  Controlled.  Stable.  Blood pressure today is 138/88.  Patient has somewhat anxious at times and hydrochlorothiazide has been taking in the past with results.  Will check CMP for electrolyte concerns. - Comprehensive Metabolic Panel (CMET)  3. Generalized edema Chronic.  Controlled.  Stable and controlled on hydrochlorothiazide 12.5 mg daily. - hydrochlorothiazide (MICROZIDE) 12.5 MG capsule; TAKE 1 CAPSULE BY MOUTH EVERY DAY  Dispense: 90 capsule; Refill: 1 - Comprehensive Metabolic Panel (CMET)  4. Reactive depression Chronic.  Controlled.  Stable.  PHQ score 0.  Continue sertraline 50 mg once a day. - sertraline (ZOLOFT) 50 MG tablet; Take 1 tablet (50 mg total) by mouth daily.  Dispense: 90 tablet; Refill: 1  5. Anxiety Chronic.  Controlled.  Stable.  Gad score is 0.  Continue sertraline 50 mg once a day. - sertraline (ZOLOFT) 50 MG tablet; Take 1 tablet (50 mg total) by mouth daily.  Dispense: 90 tablet; Refill: 1  6. Primary insomnia Chronic.  Controlled.  Stable on trazodone 50 mg 1 tablet nightly is sufficient for control. - traZODone (DESYREL) 50 MG tablet; Take 1 tablet (50 mg  total) by mouth at bedtime.  Dispense: 90 tablet; Refill: 1  7. Motion sickness, initial encounter Patient is leaving for cruise and has in the past for light of scopolamine for motion sickness and we we will refill this for fantastic vacation to [place].` - scopolamine (TRANSDERM-SCOP) 1 MG/3DAYS; Place 1 patch (1.5 mg total) onto the skin every 3 (three) days.  Dispense: 4 patch; Refill: 0

## 2021-09-14 ENCOUNTER — Encounter: Payer: Self-pay | Admitting: Oncology

## 2021-09-14 ENCOUNTER — Inpatient Hospital Stay: Payer: Managed Care, Other (non HMO) | Attending: Oncology | Admitting: Oncology

## 2021-09-14 VITALS — BP 104/74 | HR 79 | Temp 98.6°F | Wt 173.8 lb

## 2021-09-14 DIAGNOSIS — Z87891 Personal history of nicotine dependence: Secondary | ICD-10-CM | POA: Diagnosis not present

## 2021-09-14 DIAGNOSIS — Z08 Encounter for follow-up examination after completed treatment for malignant neoplasm: Secondary | ICD-10-CM | POA: Diagnosis not present

## 2021-09-14 DIAGNOSIS — Z79810 Long term (current) use of selective estrogen receptor modulators (SERMs): Secondary | ICD-10-CM | POA: Insufficient documentation

## 2021-09-14 DIAGNOSIS — Z79899 Other long term (current) drug therapy: Secondary | ICD-10-CM | POA: Insufficient documentation

## 2021-09-14 DIAGNOSIS — Z923 Personal history of irradiation: Secondary | ICD-10-CM | POA: Diagnosis not present

## 2021-09-14 DIAGNOSIS — Z5181 Encounter for therapeutic drug level monitoring: Secondary | ICD-10-CM

## 2021-09-14 DIAGNOSIS — Z170 Estrogen receptor positive status [ER+]: Secondary | ICD-10-CM | POA: Diagnosis not present

## 2021-09-14 DIAGNOSIS — R590 Localized enlarged lymph nodes: Secondary | ICD-10-CM

## 2021-09-14 DIAGNOSIS — C50912 Malignant neoplasm of unspecified site of left female breast: Secondary | ICD-10-CM | POA: Diagnosis not present

## 2021-09-14 DIAGNOSIS — Z853 Personal history of malignant neoplasm of breast: Secondary | ICD-10-CM

## 2021-09-14 LAB — COMPREHENSIVE METABOLIC PANEL
ALT: 26 IU/L (ref 0–32)
AST: 26 IU/L (ref 0–40)
Albumin/Globulin Ratio: 1.7 (ref 1.2–2.2)
Albumin: 4.6 g/dL (ref 3.8–4.8)
Alkaline Phosphatase: 65 IU/L (ref 44–121)
BUN/Creatinine Ratio: 17 (ref 9–23)
BUN: 13 mg/dL (ref 6–24)
Bilirubin Total: 0.5 mg/dL (ref 0.0–1.2)
CO2: 23 mmol/L (ref 20–29)
Calcium: 9.6 mg/dL (ref 8.7–10.2)
Chloride: 103 mmol/L (ref 96–106)
Creatinine, Ser: 0.76 mg/dL (ref 0.57–1.00)
Globulin, Total: 2.7 g/dL (ref 1.5–4.5)
Glucose: 90 mg/dL (ref 70–99)
Potassium: 4.4 mmol/L (ref 3.5–5.2)
Sodium: 142 mmol/L (ref 134–144)
Total Protein: 7.3 g/dL (ref 6.0–8.5)
eGFR: 96 mL/min/{1.73_m2} (ref >59)

## 2021-09-14 LAB — LIPID PANEL WITH LDL/HDL RATIO
Cholesterol, Total: 177 mg/dL (ref 100–199)
HDL: 63 mg/dL (ref >39)
LDL Chol Calc (NIH): 88 mg/dL (ref 0–99)
LDL/HDL Ratio: 1.4 ratio (ref 0.0–3.2)
Triglycerides: 150 mg/dL — ABNORMAL HIGH (ref 0–149)
VLDL Cholesterol Cal: 26 mg/dL (ref 5–40)

## 2021-09-14 NOTE — Progress Notes (Signed)
Pt is having hot flashes multiple times during the day and it makes her feel like she cannot breath.

## 2021-09-16 ENCOUNTER — Encounter: Payer: Self-pay | Admitting: Oncology

## 2021-09-16 NOTE — Progress Notes (Signed)
Hematology/Oncology Consult note [facility]  Telephone:([phone] [phone]  Patient Care Team: Juline Patch, MD as PCP - General (Family Medicine)   Name of the patient: Tiffany Humphrey  [id number]  Dec 06, 1970   Date of visit: 09/16/21  Diagnosis- stage I ER positive left breast cancer in 2019 currently on tamoxifen  Chief complaint/ Reason for visit-routine follow-up of breast cancer on tamoxifen  Heme/Onc history: patient is a 51- year-old female whose mother was recently diagnosed with DCIS.  She recently underwent a screening mammogram which showed abnormal calcifications in her left breast.  Diagnostic mammogram and ultrasound confirmed 9 x 5 x 5 mm calcifications.  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  There was a single focus worrisome but not diagnostic for microinvasion.  Patient has met with surgery and is tentatively scheduled to undergo lumpectomy and sentinel lymph node biopsy next week.  Patient states that she has used birth control for the last 25 years.  She used to have regular.  But about 5 months ago she did not have her menstrual cycles for about 5 months.  At that point her birth control was temporarily held and she did have her menstrual cycle after 5 months.  She did restart her birth control at that point.  She feels well today and denies any complaints.  Her appetite is good and she denies any unintentional weight loss. She is G1P1L1.  No other family history of breast cancer other than her mother had DCIS.   Patient had a biopsy on 06/22/2018.  Final pathology showed invasive mammary carcinoma 5 mm grade 2 ER greater than 90% positive PR 51 to 90% positive and HER-2/neu negative with negative margins.  Sentinel lymph node was negative for malignancy.  This was associated with high-grade DCIS.   oncotype score came back at 16. Plan is to proceed with OS plus AI.  However patient had significant headache with  the first dose of Lupron to the point that she could not function and headache persisted for almost a month.  Ovarian suppression was discontinued and patient was switched to tamoxifen since February 2020.  Adjuvant radiation treatment completed in January 2020.     Interval history-tolerating tamoxifen well without any significant side effects.  She did undergo repeat surgery for scar reduction at the site of lumpectomy which was causing her a lot of pain.  Her pain has come down since then.  Denies other complaints at this time  ECOG PS- 0 Pain scale- 0   Review of systems- Review of Systems  Constitutional:  Negative for chills, fever, malaise/fatigue and weight loss.  HENT:  Negative for congestion, ear discharge and nosebleeds.   Eyes:  Negative for blurred vision.  Respiratory:  Negative for cough, hemoptysis, sputum production, shortness of breath and wheezing.   Cardiovascular:  Negative for chest pain, palpitations, orthopnea and claudication.  Gastrointestinal:  Negative for abdominal pain, blood in stool, constipation, diarrhea, heartburn, melena, nausea and vomiting.  Genitourinary:  Negative for dysuria, flank pain, frequency, hematuria and urgency.  Musculoskeletal:  Negative for back pain, joint pain and myalgias.  Skin:  Negative for rash.  Neurological:  Negative for dizziness, tingling, focal weakness, seizures, weakness and headaches.  Endo/Heme/Allergies:  Does not bruise/bleed easily.  Psychiatric/Behavioral:  Negative for depression and suicidal ideas. The patient does not have insomnia.       Allergies  Allergen Reactions   Quinolones Hives   Clindamycin Hives  Codeine Other (See Comments) and Palpitations    tachycardia     Past Medical History:  Diagnosis Date   Anxiety    Breast cancer ([place]) 06/2018   left breast   Cancer ([place])    LEFT BREAST    Complication of anesthesia    panic attacks   GERD (gastroesophageal reflux disease)    Headache     migraines/stress, optical migraines   History of kidney stones    Hypertension    Multilevel degenerative disc disease    Personal history of radiation therapy    PONV (postoperative nausea and vomiting)    Vertigo    last episode 10/2016-NO PROBLEMS SINCE SINUS SURGERY IN 2018   Wears contact lenses      Past Surgical History:  Procedure Laterality Date   BREAST BIOPSY Right 06/08/2016   CYSTIC PAPILLARY APOCRINE METAPLASIA.    BREAST BIOPSY Left 06/06/2018   left breast stereo X clip positive   BREAST EXCISIONAL BIOPSY Left 06/22/2018   lumpectomy with sn and nl   BREAST LUMPECTOMY Left 06/22/2018   CYSTOSTOMY Right 05/10/2019   Procedure: RIGHT OVARIAN CYSTOSTOMY;  Surgeon: Schermerhorn, Gwen Her, MD;  Location: [facility];  Service: Gynecology;  Laterality: Right;   FRONTAL SINUS EXPLORATION Bilateral 12/22/2016   Procedure: FRONTAL SINUS EXPLORATION;  Surgeon: Margaretha Sheffield, MD;  Location: [place];  Service: ENT;  Laterality: Bilateral;   IMAGE GUIDED SINUS SURGERY N/A 12/22/2016   Procedure: IMAGE GUIDED SINUS SURGERY;  Surgeon: Margaretha Sheffield, MD;  Location: [place];  Service: ENT;  Laterality: N/A;   LAPAROSCOPIC TUBAL LIGATION Bilateral 05/10/2019   Procedure: LAPAROSCOPIC TUBAL LIGATION, falope rings Right fallope ring  Left tubal cautery;  Surgeon: Boykin Nearing, MD;  Location: [facility];  Service: Gynecology;  Laterality: Bilateral;   MASS EXCISION Left 05/31/2021   Procedure: EXCISION MASS (scar);  Surgeon: Herbert Pun, MD;  Location: [facility];  Service: General;  Laterality: Left;   MAXILLARY ANTROSTOMY Bilateral 12/22/2016   Procedure: MAXILLARY ANTROSTOMY;  Surgeon: Margaretha Sheffield, MD;  Location: [place];  Service: ENT;  Laterality: Bilateral;   PARTIAL MASTECTOMY WITH NEEDLE LOCALIZATION Left 06/22/2018   Procedure: PARTIAL MASTECTOMY WITH NEEDLE LOCALIZATION;  Surgeon: Herbert Pun, MD;  Location: [facility];   Service: General;  Laterality: Left;   SENTINEL NODE BIOPSY Left 06/22/2018   Procedure: SENTINEL NODE BIOPSY;  Surgeon: Herbert Pun, MD;  Location: [facility];  Service: General;  Laterality: Left;   SEPTOPLASTY N/A 12/22/2016   Procedure: SEPTOPLASTY;  Surgeon: Margaretha Sheffield, MD;  Location: [place];  Service: ENT;  Laterality: N/A;  GAVE DISK TO CECE 4-5 KP   TURBINATE REDUCTION N/A 12/22/2016   Procedure: TURBINATE REDUCTION partial inferior;  Surgeon: Margaretha Sheffield, MD;  Location: [place];  Service: ENT;  Laterality: N/A;    Social History   Socioeconomic History   Marital status: Married    Spouse name: Not on file   Number of children: Not on file   Years of education: Not on file   Highest education level: Not on file  Occupational History   Not on file  Tobacco Use   Smoking status: Former    Packs/day: 1.00    Years: 15.00    Pack years: 15.00    Types: Cigarettes    Quit date: 2013    Years since quitting: 10.0   Smokeless tobacco: Never  Vaping Use   Vaping Use: Former  Substance and Sexual Activity  Alcohol use: Yes    Alcohol/week: 2.0 standard drinks    Types: 1 Glasses of wine, 1 Cans of beer per week    Comment: social (4-6 drinks/month)   Drug use: No   Sexual activity: Yes  Other Topics Concern   Not on file  Social History Narrative   Lives at home with hubby   Social Determinants of Health   Financial Resource Strain: Not on file  Food Insecurity: Not on file  Transportation Needs: Not on file  Physical Activity: Not on file  Stress: Not on file  Social Connections: Not on file  Intimate Partner Violence: Not on file    Family History  Problem Relation Age of Onset   Breast cancer Mother        dx 20s; 2nd primary at 66   Breast cancer Paternal Grandmother        dx 15s; deceased 13s   Breast cancer Paternal Aunt        dx 43s; currently 18s   Breast cancer Cousin        dx 68s; currently late 29s; daughter  of a paternal uncle   Lung cancer Paternal Uncle        smoker; deceased     Current Outpatient Medications:    atorvastatin (LIPITOR) 10 MG tablet, Take 1 tablet (10 mg total) by mouth daily., Disp: 90 tablet, Rfl: 1   calcium-vitamin D (OSCAL WITH D) 500-200 MG-UNIT tablet, Take 1 tablet by mouth., Disp: , Rfl:    Cyanocobalamin (B-12) 2500 MCG SUBL, Place 2,500 mcg under the tongue daily., Disp: , Rfl:    ezetimibe (ZETIA) 10 MG tablet, Take 10 mg by mouth daily., Disp: , Rfl:    hydrochlorothiazide (MICROZIDE) 12.5 MG capsule, TAKE 1 CAPSULE BY MOUTH EVERY DAY, Disp: 90 capsule, Rfl: 1   ibuprofen (ADVIL) 800 MG tablet, Take 800 mg by mouth every 8 (eight) hours as needed for headache., Disp: , Rfl:    levocetirizine (XYZAL) 5 MG tablet, Take 5 mg by mouth daily. , Disp: , Rfl:    meclizine (ANTIVERT) 25 MG tablet, Take 25 mg by mouth 3 (three) times daily as needed for dizziness., Disp: , Rfl:    naproxen sodium (ALEVE) 220 MG tablet, Take 440 mg by mouth 2 (two) times daily as needed (pain.)., Disp: , Rfl:    NON FORMULARY, Place 3 drops under the tongue at bedtime. Allergy Oral Drops made by [facility], Disp: , Rfl:    omeprazole (PRILOSEC) 40 MG capsule, Take 40 mg by mouth daily., Disp: , Rfl:    scopolamine (TRANSDERM-SCOP) 1 MG/3DAYS, Place 1 patch (1.5 mg total) onto the skin every 3 (three) days., Disp: 4 patch, Rfl: 0   sertraline (ZOLOFT) 50 MG tablet, Take 1 tablet (50 mg total) by mouth daily., Disp: 90 tablet, Rfl: 1   tamoxifen (NOLVADEX) 20 MG tablet, TAKE 1 TABLET BY MOUTH EVERY DAY, Disp: 90 tablet, Rfl: 1   traMADol (ULTRAM) 50 MG tablet, Take 50 mg by mouth every 6 (six) hours as needed (pain)., Disp: , Rfl:    traMADol (ULTRAM) 50 MG tablet, Take 1 tablet (50 mg total) by mouth every 6 (six) hours as needed., Disp: 20 tablet, Rfl: 0   traZODone (DESYREL) 50 MG tablet, Take 1 tablet (50 mg total) by mouth at bedtime., Disp: 90 tablet, Rfl: 1   triamcinolone  (NASACORT) 55 MCG/ACT AERO nasal inhaler, Place 1 spray into the nose daily., Disp: , Rfl:  ALPRAZolam (XANAX) 0.25 MG tablet, Take 1 tablet (0.25 mg total) by mouth daily as needed for anxiety. (Patient not taking: Reported on 09/14/2021), Disp: 30 tablet, Rfl: 0  Physical exam:  Vitals:   09/14/21 1122  BP: 104/74  Pulse: 79  Temp: 98.6 F (37 C)  TempSrc: Tympanic  Weight: 173 lb 12.8 oz (78.8 kg)   Physical Exam Constitutional:      General: She is not in acute distress. Cardiovascular:     Rate and Rhythm: Normal rate and regular rhythm.     Heart sounds: Normal heart sounds.  Pulmonary:     Effort: Pulmonary effort is normal.     Breath sounds: Normal breath sounds.  Abdominal:     General: Bowel sounds are normal.     Palpations: Abdomen is soft.  Skin:    General: Skin is warm and dry.  Neurological:     Mental Status: She is alert and oriented to person, place, and time.   Breast exam was performed in seated and lying down position. Patient is status post left lumpectomy with a well-healed surgical scar. No evidence of any palpable masses. No evidence of axillary adenopathy. No evidence of any palpable masses or lumps in the right breast. No evidence of right axillary adenopathy   CMP Latest Ref Rng & Units 09/13/2021  Glucose 70 - 99 mg/dL 90  BUN 6 - 24 mg/dL 13  Creatinine 0.57 - 1.00 mg/dL 0.76  Sodium 134 - 144 mmol/L 142  Potassium 3.5 - 5.2 mmol/L 4.4  Chloride 96 - 106 mmol/L 103  CO2 20 - 29 mmol/L 23  Calcium 8.7 - 10.2 mg/dL 9.6  Total Protein 6.0 - 8.5 g/dL 7.3  Total Bilirubin 0.0 - 1.2 mg/dL 0.5  Alkaline Phos 44 - 121 IU/L 65  AST 0 - 40 IU/L 26  ALT 0 - 32 IU/L 26   CBC Latest Ref Rng & Units 05/31/2021  WBC 4.0 - 10.5 K/uL -  Hemoglobin 12.0 - 15.0 g/dL 15.3(H)  Hematocrit 36.0 - 46.0 % 45.0  Platelets 150 - 400 K/uL -     Assessment and plan- Patient is a 51 y.o. female  with ER positive DCIS on biopsy.  Final pathology showed stage I  apT1 apN0 cM0 invasive mammary carcinoma grade 2 ER/PR positive HER-2/neu negative s/p lumpectomy and adjuvant radiation treatment.  She is currently on tamoxifen and this is a routine follow-up visit  Patient is nearing menopause at this time.  She has not had menstrual cycles for about 10 months now.  We discussed continuing tamoxifen versus switching to AI.  She would like to continue with tamoxifen at this time.  Clinically she is doing well with no concerning signs and symptoms of recurrence based on today's exam.  She would be due forMammogram in July 2023 which I will schedule.  We will also get a bone density scan at that time and I will see her thereafter   Visit Diagnosis 1. Encounter for follow-up surveillance of breast cancer   2. Encounter for monitoring tamoxifen therapy   3. History of left breast cancer      Dr. Randa Evens, MD, MPH [facility] [phone] 09/16/2021 11:31 AM

## 2021-09-17 ENCOUNTER — Encounter: Payer: Self-pay | Admitting: Oncology

## 2021-10-13 ENCOUNTER — Encounter: Payer: Self-pay | Admitting: Family Medicine

## 2021-11-11 ENCOUNTER — Other Ambulatory Visit: Payer: Self-pay | Admitting: Family Medicine

## 2021-11-11 ENCOUNTER — Other Ambulatory Visit: Payer: Self-pay | Admitting: Oncology

## 2021-11-11 NOTE — Telephone Encounter (Signed)
pre scriber not at this practice. ?Requested Prescriptions  ?Refused Prescriptions Disp Refills  ?? tamoxifen (NOLVADEX) 20 MG tablet [Pharmacy Med Name: TAMOXIFEN 20 MG TABLET] 90 tablet 1  ?  Sig: TAKE 1 TABLET BY MOUTH EVERY DAY  ?  ? There is no refill protocol information for this order  ?  ? ?

## 2021-12-15 ENCOUNTER — Other Ambulatory Visit: Payer: Self-pay

## 2021-12-15 ENCOUNTER — Ambulatory Visit: Payer: Managed Care, Other (non HMO) | Admitting: Family Medicine

## 2021-12-15 ENCOUNTER — Encounter: Payer: Self-pay | Admitting: Family Medicine

## 2021-12-15 VITALS — BP 124/80 | HR 64 | Ht 65.0 in | Wt 177.0 lb

## 2021-12-15 DIAGNOSIS — R051 Acute cough: Secondary | ICD-10-CM

## 2021-12-15 DIAGNOSIS — J0100 Acute maxillary sinusitis, unspecified: Secondary | ICD-10-CM | POA: Diagnosis not present

## 2021-12-15 MED ORDER — FLUCONAZOLE 200 MG PO TABS: 200.0000 mg | ORAL_TABLET | Freq: Every day | ORAL | 0 refills | Status: DC

## 2021-12-15 MED ORDER — BENZONATATE 100 MG PO CAPS: 100.0000 mg | ORAL_CAPSULE | Freq: Three times a day (TID) | ORAL | 0 refills | Status: DC | PRN

## 2021-12-15 MED ORDER — AMOXICILLIN-POT CLAVULANATE 875-125 MG PO TABS: 1.0000 | ORAL_TABLET | Freq: Two times a day (BID) | ORAL | 0 refills | Status: DC

## 2021-12-15 NOTE — Progress Notes (Signed)
? ? ?Date:  12/15/2021  ? ?Name:  Tiffany Humphrey   DOB:  08-19-1971   MRN:  [id number] ? ? ?Chief Complaint: Sinusitis (Yellow production, cough, hoarseness, drainage) ? ?Sinusitis ?This is a chronic problem. The current episode started 1 to 4 weeks ago (2 weeks). The problem has been gradually worsening since onset. There has been no fever. Her pain is at a severity of 4/10. Associated symptoms include congestion, coughing, diaphoresis, headaches, a hoarse voice, sinus pressure, sneezing and a sore throat. Pertinent negatives include no chills, ear pain, neck pain or shortness of breath. Past treatments include nothing. The treatment provided mild relief.  ? ?Lab Results  ?Component Value Date  ? NA 142 09/13/2021  ? K 4.4 09/13/2021  ? CO2 23 09/13/2021  ? GLUCOSE 90 09/13/2021  ? BUN 13 09/13/2021  ? CREATININE 0.76 09/13/2021  ? CALCIUM 9.6 09/13/2021  ? EGFR 96 09/13/2021  ? GFRNONAA >60 09/03/2020  ? ?Lab Results  ?Component Value Date  ? CHOL 177 09/13/2021  ? HDL 63 09/13/2021  ? [place] 88 09/13/2021  ? TRIG 150 (H) 09/13/2021  ? ?Lab Results  ?Component Value Date  ? TSH 1.730 07/01/2019  ? ?No results found for: HGBA1C ?Lab Results  ?Component Value Date  ? WBC 10.0 08/24/2020  ? HGB 15.3 (H) 05/31/2021  ? HCT 45.0 05/31/2021  ? MCV 86.8 08/24/2020  ? PLT 318 08/24/2020  ? ?Lab Results  ?Component Value Date  ? ALT 26 09/13/2021  ? AST 26 09/13/2021  ? ALKPHOS 65 09/13/2021  ? BILITOT 0.5 09/13/2021  ? ?No results found for: 25OHVITD2, [place], VD25OH  ? ?Review of Systems  ?Constitutional:  Positive for diaphoresis and fever. Negative for chills.  ?HENT:  Positive for congestion, hoarse voice, sinus pressure, sneezing and sore throat. Negative for drooling, ear discharge and ear pain.   ?Respiratory:  Positive for cough. Negative for shortness of breath and wheezing.   ?Cardiovascular:  Negative for chest pain, palpitations and leg swelling.  ?Gastrointestinal:  Negative for abdominal pain, blood in  stool, constipation, diarrhea and nausea.  ?Endocrine: Negative for polydipsia.  ?Genitourinary:  Negative for dysuria, frequency, hematuria and urgency.  ?Musculoskeletal:  Negative for back pain, myalgias and neck pain.  ?Skin:  Negative for rash.  ?Allergic/Immunologic: Negative for environmental allergies.  ?Neurological:  Positive for headaches. Negative for dizziness.  ?Hematological:  Does not bruise/bleed easily.  ?Psychiatric/Behavioral:  Negative for suicidal ideas. The patient is not nervous/anxious.   ? ?Patient Active Problem List  ? Diagnosis Date Noted  ? 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 Genetic testing 01/14/2019  ? Malignant neoplasm of upper-outer quadrant of left breast in female, estrogen receptor positive ([place]) 07/06/2018  ? Breast cancer ([place]) 06/18/2018  ? Migraines 08/03/2015  ? ? ?Allergies  ?Allergen Reactions  ? Quinolones Hives  ? Clindamycin Hives  ? Codeine Other (See Comments) and Palpitations  ?  tachycardia  ? ? ?Past Surgical History:  ?Procedure Laterality Date  ? BREAST BIOPSY Right 06/08/2016  ? CYSTIC PAPILLARY APOCRINE METAPLASIA.   ? BREAST BIOPSY Left 06/06/2018  ? left breast stereo X clip positive  ? BREAST EXCISIONAL BIOPSY Left 06/22/2018  ? lumpectomy with sn and nl  ? BREAST LUMPECTOMY Left 06/22/2018  ? CYSTOSTOMY Right 05/10/2019  ? Procedure: RIGHT OVARIAN CYSTOSTOMY;  Surgeon: Schermerhorn, Gwen Her, MD;  Location: [facility];  Service: Gynecology;  Laterality: Right;  ? FRONTAL SINUS EXPLORATION Bilateral 12/22/2016  ? Procedure: FRONTAL SINUS EXPLORATION;  Surgeon: Margaretha Sheffield, MD;  Location: [place];  Service: ENT;  Laterality: Bilateral;  ? IMAGE GUIDED SINUS SURGERY N/A 12/22/2016  ? Procedure: IMAGE GUIDED SINUS SURGERY;  Surgeon: Margaretha Sheffield, MD;  Location: [place];  Service: ENT;  Laterality: N/A;  ? LAPAROSCOPIC TUBAL LIGATION Bilateral 05/10/2019  ? Procedure: LAPAROSCOPIC TUBAL LIGATION, falope rings Right fallope ring  Left tubal  cautery;  Surgeon: Schermerhorn, Gwen Her, MD;  Location: [facility];  Service: Gynecology;  Laterality: Bilateral;  ? MASS EXCISION Left 05/31/2021  ? Procedure: EXCISION MASS (scar);  Surgeon: Herbert Pun, MD;  Location: [facility];  Service: General;  Laterality: Left;  ? MAXILLARY ANTROSTOMY Bilateral 12/22/2016  ? Procedure: MAXILLARY ANTROSTOMY;  Surgeon: Margaretha Sheffield, MD;  Location: [place];  Service: ENT;  Laterality: Bilateral;  ? PARTIAL MASTECTOMY WITH NEEDLE LOCALIZATION Left 06/22/2018  ? Procedure: PARTIAL MASTECTOMY WITH NEEDLE LOCALIZATION;  Surgeon: Herbert Pun, MD;  Location: [facility];  Service: General;  Laterality: Left;  ? SENTINEL NODE BIOPSY Left 06/22/2018  ? Procedure: SENTINEL NODE BIOPSY;  Surgeon: Herbert Pun, MD;  Location: [facility];  Service: General;  Laterality: Left;  ? SEPTOPLASTY N/A 12/22/2016  ? Procedure: SEPTOPLASTY;  Surgeon: Margaretha Sheffield, MD;  Location: [place];  Service: ENT;  Laterality: N/A;  GAVE DISK TO CECE 4-5 KP  ? TURBINATE REDUCTION N/A 12/22/2016  ? Procedure: TURBINATE REDUCTION partial inferior;  Surgeon: Margaretha Sheffield, MD;  Location: [place];  Service: ENT;  Laterality: N/A;  ? ? ?Social History  ? ?Tobacco Use  ? Smoking status: Former  ?  Packs/day: 1.00  ?  Years: 15.00  ?  Pack years: 15.00  ?  Types: Cigarettes  ?  Quit date: 2013  ?  Years since quitting: 10.3  ? Smokeless tobacco: Never  ?Vaping Use  ? Vaping Use: Former  ?Substance Use Topics  ? Alcohol use: Yes  ?  Alcohol/week: 2.0 standard drinks  ?  Types: 1 Glasses of wine, 1 Cans of beer per week  ?  Comment: social (4-6 drinks/month)  ? Drug use: No  ? ? ? ?Medication list has been reviewed and updated. ? ?Current Meds  ?Medication Sig  ? atorvastatin (LIPITOR) 10 MG tablet Take 1 tablet (10 mg total) by mouth daily.  ? calcium-vitamin D (OSCAL WITH D) 500-200 MG-UNIT tablet Take 1 tablet by mouth.  ? Cyanocobalamin (B-12) 2500 MCG SUBL  Place 2,500 mcg under the tongue daily.  ? ezetimibe (ZETIA) 10 MG tablet Take 10 mg by mouth daily.  ? hydrochlorothiazide (MICROZIDE) 12.5 MG capsule TAKE 1 CAPSULE BY MOUTH EVERY DAY  ? ibuprofen (ADVIL) 800 MG tablet Take 800 mg by mouth every 8 (eight) hours as needed for headache.  ? levocetirizine (XYZAL) 5 MG tablet Take 5 mg by mouth daily.   ? meclizine (ANTIVERT) 25 MG tablet Take 25 mg by mouth 3 (three) times daily as needed for dizziness.  ? naproxen sodium (ALEVE) 220 MG tablet Take 440 mg by mouth 2 (two) times daily as needed (pain.).  ? NON FORMULARY Place 3 drops under the tongue at bedtime. Allergy Oral Drops made by [facility]  ? omeprazole (PRILOSEC) 40 MG capsule Take 40 mg by mouth daily.  ? sertraline (ZOLOFT) 50 MG tablet Take 1 tablet (50 mg total) by mouth daily.  ? tamoxifen (NOLVADEX) 20 MG tablet TAKE 1 TABLET BY MOUTH EVERY DAY  ? traMADol (ULTRAM) 50 MG tablet Take 50 mg by  mouth every 6 (six) hours as needed (pain).  ? traMADol (ULTRAM) 50 MG tablet Take 1 tablet (50 mg total) by mouth every 6 (six) hours as needed.  ? traZODone (DESYREL) 50 MG tablet Take 1 tablet (50 mg total) by mouth at bedtime.  ? triamcinolone (NASACORT) 55 MCG/ACT AERO nasal inhaler Place 1 spray into the nose daily.  ? [DISCONTINUED] scopolamine (TRANSDERM-SCOP) 1 MG/3DAYS Place 1 patch (1.5 mg total) onto the skin every 3 (three) days.  ? ? ? ?  12/15/2021  ? 10:27 AM 09/13/2021  ? 10:15 AM 03/11/2021  ? 10:44 AM 07/15/2020  ?  8:07 AM  ?GAD 7 : Generalized Anxiety Score  ?Nervous, Anxious, on Edge 0 0 0 0  ?Control/stop worrying 0 0 0 0  ?Worry too much - different things 0 0 0 0  ?Trouble relaxing 0 0 0 1  ?Restless 0 0 0 0  ?Easily annoyed or irritable 0 0 0 2  ?Afraid - awful might happen 0 0 0 0  ?Total GAD 7 Score 0 0 0 3  ?Anxiety Difficulty Not difficult at all Not difficult at all  Not difficult at all  ? ? ? ?  12/15/2021  ? 10:26 AM  ?Depression screen PHQ 2/9  ?Decreased Interest 0  ?Down,  Depressed, Hopeless 0  ?PHQ - 2 Score 0  ?Altered sleeping 0  ?Tired, decreased energy 0  ?Change in appetite 0  ?Feeling bad or failure about yourself  0  ?Trouble concentrating 0  ?Moving slowly or fidgety/restle

## 2022-01-12 ENCOUNTER — Telehealth: Payer: Self-pay

## 2022-01-12 ENCOUNTER — Ambulatory Visit: Payer: Managed Care, Other (non HMO) | Admitting: Family Medicine

## 2022-01-12 ENCOUNTER — Encounter: Payer: Self-pay | Admitting: Oncology

## 2022-01-12 ENCOUNTER — Ambulatory Visit
Admission: RE | Admit: 2022-01-12 | Discharge: 2022-01-12 | Disposition: A | Discharge status: Home / Self Care | Payer: Managed Care, Other (non HMO) | Attending: Family Medicine | Admitting: Family Medicine

## 2022-01-12 ENCOUNTER — Ambulatory Visit
Admission: RE | Admit: 2022-01-12 | Discharge: 2022-01-12 | Disposition: A | Discharge status: Home / Self Care | Payer: Managed Care, Other (non HMO) | Source: Ambulatory Visit | Attending: Family Medicine | Admitting: Family Medicine

## 2022-01-12 ENCOUNTER — Encounter: Payer: Self-pay | Admitting: Family Medicine

## 2022-01-12 VITALS — BP 120/80 | HR 80 | Temp 98.3°F | Ht 65.0 in | Wt 177.0 lb

## 2022-01-12 DIAGNOSIS — R051 Acute cough: Secondary | ICD-10-CM

## 2022-01-12 DIAGNOSIS — J0100 Acute maxillary sinusitis, unspecified: Secondary | ICD-10-CM

## 2022-01-12 MED ORDER — BENZONATATE 100 MG PO CAPS: 100.0000 mg | ORAL_CAPSULE | Freq: Three times a day (TID) | ORAL | 0 refills | Status: DC | PRN

## 2022-01-12 MED ORDER — AMOXICILLIN-POT CLAVULANATE 875-125 MG PO TABS: 1.0000 | ORAL_TABLET | Freq: Two times a day (BID) | ORAL | 0 refills | Status: DC

## 2022-01-12 NOTE — Progress Notes (Signed)
? ? ?Date:  01/12/2022  ? ?Name:  Tiffany Humphrey   DOB:  October 06, 1970   MRN:  [id number] ? ? ?Chief Complaint: Sinusitis (Had Augmentin and tessalon perles on April 19) ? ?Sinusitis ?This is a new problem. The current episode started in the past 7 days (Monday). The problem has been gradually worsening since onset. The maximum temperature recorded prior to her arrival was 101 - 101.9 F. The fever has been present for 1 to 2 days. Her pain is at a severity of 6/10. The pain is moderate. Associated symptoms include chills, congestion, coughing, ear pain, headaches, sinus pressure and a sore throat. Pertinent negatives include no diaphoresis, hoarse voice, shortness of breath or swollen glands. Past treatments include oral decongestants and acetaminophen (nasacort). The treatment provided mild relief.  ? ?Lab Results  ?Component Value Date  ? NA 142 09/13/2021  ? K 4.4 09/13/2021  ? CO2 23 09/13/2021  ? GLUCOSE 90 09/13/2021  ? BUN 13 09/13/2021  ? CREATININE 0.76 09/13/2021  ? CALCIUM 9.6 09/13/2021  ? EGFR 96 09/13/2021  ? GFRNONAA >60 09/03/2020  ? ?Lab Results  ?Component Value Date  ? CHOL 177 09/13/2021  ? HDL 63 09/13/2021  ? Pender 88 09/13/2021  ? TRIG 150 (H) 09/13/2021  ? ?Lab Results  ?Component Value Date  ? TSH 1.730 07/01/2019  ? ?No results found for: HGBA1C ?Lab Results  ?Component Value Date  ? WBC 10.0 08/24/2020  ? HGB 15.3 (H) 05/31/2021  ? HCT 45.0 05/31/2021  ? MCV 86.8 08/24/2020  ? PLT 318 08/24/2020  ? ?Lab Results  ?Component Value Date  ? ALT 26 09/13/2021  ? AST 26 09/13/2021  ? ALKPHOS 65 09/13/2021  ? BILITOT 0.5 09/13/2021  ? ?No results found for: 25OHVITD2, [place], VD25OH  ? ?Review of Systems  ?Constitutional:  Positive for chills. Negative for diaphoresis.  ?HENT:  Positive for congestion, ear pain, postnasal drip, sinus pressure, sinus pain and sore throat. Negative for hoarse voice, mouth sores and nosebleeds.   ?     Bloody nasal discharge  ?Respiratory:  Positive for cough.  Negative for shortness of breath.   ?Cardiovascular:  Negative for chest pain.  ?Neurological:  Positive for headaches.  ? ?Patient Active Problem List  ? Diagnosis Date Noted  ? 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 Genetic testing 01/14/2019  ? Malignant neoplasm of upper-outer quadrant of left breast in female, estrogen receptor positive ([employer]) 07/06/2018  ? Breast cancer ([place]) 06/18/2018  ? Migraines 08/03/2015  ? ? ?Allergies  ?Allergen Reactions  ? Quinolones Hives  ? Clindamycin Hives  ? Codeine Other (See Comments) and Palpitations  ?  tachycardia  ? ? ?Past Surgical History:  ?Procedure Laterality Date  ? BREAST BIOPSY Right 06/08/2016  ? CYSTIC PAPILLARY APOCRINE METAPLASIA.   ? BREAST BIOPSY Left 06/06/2018  ? left breast stereo X clip positive  ? BREAST EXCISIONAL BIOPSY Left 06/22/2018  ? lumpectomy with sn and nl  ? BREAST LUMPECTOMY Left 06/22/2018  ? CYSTOSTOMY Right 05/10/2019  ? Procedure: RIGHT OVARIAN CYSTOSTOMY;  Surgeon: Schermerhorn, Gwen Her, MD;  Location: [facility];  Service: Gynecology;  Laterality: Right;  ? FRONTAL SINUS EXPLORATION Bilateral 12/22/2016  ? Procedure: FRONTAL SINUS EXPLORATION;  Surgeon: Margaretha Sheffield, MD;  Location: [place];  Service: ENT;  Laterality: Bilateral;  ? IMAGE GUIDED SINUS SURGERY N/A 12/22/2016  ? Procedure: IMAGE GUIDED SINUS SURGERY;  Surgeon: Margaretha Sheffield, MD;  Location: [place];  Service: ENT;  Laterality: N/A;  ? LAPAROSCOPIC TUBAL LIGATION Bilateral 05/10/2019  ? Procedure: LAPAROSCOPIC TUBAL LIGATION, falope rings Right fallope ring  Left tubal cautery;  Surgeon: Schermerhorn, Gwen Her, MD;  Location: [facility];  Service: Gynecology;  Laterality: Bilateral;  ? MASS EXCISION Left 05/31/2021  ? Procedure: EXCISION MASS (scar);  Surgeon: Herbert Pun, MD;  Location: [facility];  Service: General;  Laterality: Left;  ? MAXILLARY ANTROSTOMY Bilateral 12/22/2016  ? Procedure: MAXILLARY ANTROSTOMY;  Surgeon: Margaretha Sheffield, MD;  Location:  [place];  Service: ENT;  Laterality: Bilateral;  ? PARTIAL MASTECTOMY WITH NEEDLE LOCALIZATION Left 06/22/2018  ? Procedure: PARTIAL MASTECTOMY WITH NEEDLE LOCALIZATION;  Surgeon: Herbert Pun, MD;  Location: [facility];  Service: General;  Laterality: Left;  ? SENTINEL NODE BIOPSY Left 06/22/2018  ? Procedure: SENTINEL NODE BIOPSY;  Surgeon: Herbert Pun, MD;  Location: [facility];  Service: General;  Laterality: Left;  ? SEPTOPLASTY N/A 12/22/2016  ? Procedure: SEPTOPLASTY;  Surgeon: Margaretha Sheffield, MD;  Location: [place];  Service: ENT;  Laterality: N/A;  GAVE DISK TO CECE 4-5 KP  ? TURBINATE REDUCTION N/A 12/22/2016  ? Procedure: TURBINATE REDUCTION partial inferior;  Surgeon: Margaretha Sheffield, MD;  Location: [place];  Service: ENT;  Laterality: N/A;  ? ? ?Social History  ? ?Tobacco Use  ? Smoking status: Former  ?  Packs/day: 1.00  ?  Years: 15.00  ?  Pack years: 15.00  ?  Types: Cigarettes  ?  Quit date: 2013  ?  Years since quitting: 10.3  ? Smokeless tobacco: Never  ?Vaping Use  ? Vaping Use: Former  ?Substance Use Topics  ? Alcohol use: Yes  ?  Alcohol/week: 2.0 standard drinks  ?  Types: 1 Glasses of wine, 1 Cans of beer per week  ?  Comment: social (4-6 drinks/month)  ? Drug use: No  ? ? ? ?Medication list has been reviewed and updated. ? ?No outpatient medications have been marked as taking for the 01/12/22 encounter (Office Visit) with Juline Patch, MD.  ? ? ? ?  01/12/2022  ? 11:39 AM 12/15/2021  ? 10:27 AM 09/13/2021  ? 10:15 AM 03/11/2021  ? 10:44 AM  ?GAD 7 : Generalized Anxiety Score  ?Nervous, Anxious, on Edge 0 0 0 0  ?Control/stop worrying 0 0 0 0  ?Worry too much - different things 0 0 0 0  ?Trouble relaxing 0 0 0 0  ?Restless 0 0 0 0  ?Easily annoyed or irritable 0 0 0 0  ?Afraid - awful might happen 0 0 0 0  ?Total GAD 7 Score 0 0 0 0  ?Anxiety Difficulty Not difficult at all Not difficult at all Not difficult at all   ? ? ? ?  01/12/2022  ? 11:39 AM   ?Depression screen PHQ 2/9  ?Decreased Interest 0  ?Down, Depressed, Hopeless 0  ?PHQ - 2 Score 0  ?Altered sleeping 0  ?Tired, decreased energy 0  ?Change in appetite 0  ?Feeling bad or failure about yourself  0  ?Trouble concentrating 0  ?Moving slowly or fidgety/restless 0  ?Suicidal thoughts 0  ?PHQ-9 Score 0  ?Difficult doing work/chores Not difficult at all  ? ? ?BP Readings from Last 3 Encounters:  ?01/12/22 120/80  ?12/15/21 124/80  ?09/14/21 104/74  ? ? ?Physical Exam ? ?Wt Readings from Last 3 Encounters:  ?01/12/22 177 lb (80.3 kg)  ?12/15/21 177 lb (80.3 kg)  ?09/14/21 173 lb 12.8 oz (78.8 kg)  ? ? ?  BP 120/80   Pulse 80   Temp 98.3 ?F (36.8 ?C) (Oral)   Ht 5' 5"  (1.651 m)   Wt 177 lb (80.3 kg)   BMI 29.45 kg/m?  ? ?Assessment and Plan: ?1. Acute maxillary sinusitis, recurrence not specified ?New onset.  Similar to the sinus infection in April.  Postnasal drainage which is green to yellow in discharge.  We will initiate Augmentin pending x-ray. ?- amoxicillin-clavulanate (AUGMENTIN) 875-125 MG tablet; Take 1 tablet by mouth 2 (two) times daily.  Dispense: 20 tablet; Refill: 0 ? ?2. Acute cough ?Patient's also had an acute cough with fever to 101 over the evening.  There is no significant shortness of breath but initial pulse ox was 92 with eventually achieving 97.  Patient's been given Ladona Ridgel we will check chest x-ray to rule out pneumonia and evaluate for any lung disease since she used to be a smoker. ?- DG Chest 2 View; Future ?- benzonatate (TESSALON PERLES) 100 MG capsule; Take 1 capsule (100 mg total) by mouth 3 (three) times daily as needed for cough.  Dispense: 20 capsule; Refill: 0  ? ? ? ?

## 2022-01-12 NOTE — Telephone Encounter (Signed)
Called pt with no pneum. Continue Augmentin ?

## 2022-02-23 ENCOUNTER — Encounter: Payer: Self-pay | Admitting: Family Medicine

## 2022-03-03 ENCOUNTER — Encounter: Payer: Self-pay | Admitting: Oncology

## 2022-03-10 ENCOUNTER — Ambulatory Visit
Admission: RE | Admit: 2022-03-10 | Discharge: 2022-03-10 | Disposition: A | Discharge status: Home / Self Care | Payer: Managed Care, Other (non HMO) | Source: Ambulatory Visit | Attending: Oncology | Admitting: Oncology

## 2022-03-10 ENCOUNTER — Encounter: Payer: Self-pay | Admitting: Oncology

## 2022-03-10 DIAGNOSIS — Z5181 Encounter for therapeutic drug level monitoring: Secondary | ICD-10-CM | POA: Diagnosis present

## 2022-03-10 DIAGNOSIS — Z79810 Long term (current) use of selective estrogen receptor modulators (SERMs): Secondary | ICD-10-CM | POA: Insufficient documentation

## 2022-03-10 DIAGNOSIS — Z853 Personal history of malignant neoplasm of breast: Secondary | ICD-10-CM | POA: Diagnosis present

## 2022-03-10 DIAGNOSIS — Z08 Encounter for follow-up examination after completed treatment for malignant neoplasm: Secondary | ICD-10-CM

## 2022-03-10 LAB — HM DEXA SCAN: HM Dexa Scan: NORMAL

## 2022-03-11 ENCOUNTER — Other Ambulatory Visit: Payer: Self-pay | Admitting: Family Medicine

## 2022-03-11 DIAGNOSIS — F5101 Primary insomnia: Secondary | ICD-10-CM

## 2022-03-11 DIAGNOSIS — R601 Generalized edema: Secondary | ICD-10-CM

## 2022-03-11 NOTE — Telephone Encounter (Signed)
Requested Prescriptions  Pending Prescriptions Disp Refills  . traZODone (DESYREL) 50 MG tablet [Pharmacy Med Name: TRAZODONE 50 MG TABLET] 90 tablet 0    Sig: TAKE 1 TABLET BY MOUTH EVERYDAY AT BEDTIME     Psychiatry: Antidepressants - Serotonin Modulator Passed - 03/11/2022  2:32 AM      Passed - Valid encounter within last 6 months    Recent Outpatient Visits          1 month ago Acute maxillary sinusitis, recurrence not specified   [facility] Juline Patch, MD   2 months ago Acute maxillary sinusitis, recurrence not specified   [facility] Juline Patch, MD   5 months ago Familial hypercholesterolemia   [facility] Juline Patch, MD   1 year ago Hypocalcemia   [facility] Juline Patch, MD   1 year ago Reactive depression   [facility] Juline Patch, MD      Future Appointments            In 4 days Juline Patch, MD [facility]           . hydrochlorothiazide (MICROZIDE) 12.5 MG capsule [Pharmacy Med Name: HYDROCHLOROTHIAZIDE 12.5 MG CP] 90 capsule 0    Sig: TAKE 1 CAPSULE BY MOUTH EVERY DAY     Cardiovascular: Diuretics - Thiazide Passed - 03/11/2022  2:32 AM      Passed - Cr in normal range and within 180 days    Creatinine, Ser  Date Value Ref Range Status  09/13/2021 0.76 0.57 - 1.00 mg/dL Final         Passed - K in normal range and within 180 days    Potassium  Date Value Ref Range Status  09/13/2021 4.4 3.5 - 5.2 mmol/L Final         Passed - Na in normal range and within 180 days    Sodium  Date Value Ref Range Status  09/13/2021 142 134 - 144 mmol/L Final         Passed - Last BP in normal range    BP Readings from Last 1 Encounters:  01/12/22 120/80         Passed - Valid encounter within last 6 months    Recent Outpatient Visits          1 month ago Acute maxillary sinusitis, recurrence not specified   [facility] Juline Patch, MD   2 months ago Acute  maxillary sinusitis, recurrence not specified   [facility] Juline Patch, MD   5 months ago Familial hypercholesterolemia   [facility] Juline Patch, MD   1 year ago Hypocalcemia   [facility] Juline Patch, MD   1 year ago Reactive depression   [facility] Juline Patch, MD      Future Appointments            In 4 days Juline Patch, MD [facility], [facility]

## 2022-03-14 ENCOUNTER — Ambulatory Visit: Payer: Managed Care, Other (non HMO) | Admitting: Family Medicine

## 2022-03-15 ENCOUNTER — Encounter: Payer: Self-pay | Admitting: Family Medicine

## 2022-03-15 ENCOUNTER — Ambulatory Visit: Payer: Managed Care, Other (non HMO) | Admitting: Family Medicine

## 2022-03-15 VITALS — BP 120/80 | HR 80 | Ht 65.0 in | Wt 181.0 lb

## 2022-03-15 DIAGNOSIS — F419 Anxiety disorder, unspecified: Secondary | ICD-10-CM

## 2022-03-15 DIAGNOSIS — E7801 Familial hypercholesterolemia: Secondary | ICD-10-CM | POA: Diagnosis not present

## 2022-03-15 DIAGNOSIS — G44309 Post-traumatic headache, unspecified, not intractable: Secondary | ICD-10-CM | POA: Diagnosis not present

## 2022-03-15 DIAGNOSIS — F329 Major depressive disorder, single episode, unspecified: Secondary | ICD-10-CM | POA: Diagnosis not present

## 2022-03-15 DIAGNOSIS — F5101 Primary insomnia: Secondary | ICD-10-CM

## 2022-03-15 DIAGNOSIS — R601 Generalized edema: Secondary | ICD-10-CM

## 2022-03-15 DIAGNOSIS — K219 Gastro-esophageal reflux disease without esophagitis: Secondary | ICD-10-CM

## 2022-03-15 MED ORDER — OMEPRAZOLE 40 MG PO CPDR: 40.0000 mg | DELAYED_RELEASE_CAPSULE | Freq: Every day | ORAL | 1 refills | Status: DC

## 2022-03-15 MED ORDER — HYDROCHLOROTHIAZIDE 12.5 MG PO CAPS: 12.5000 mg | ORAL_CAPSULE | Freq: Every day | ORAL | 1 refills | Status: DC

## 2022-03-15 MED ORDER — TRAZODONE HCL 50 MG PO TABS: 50.0000 mg | ORAL_TABLET | Freq: Every day | ORAL | 1 refills | Status: DC

## 2022-03-15 MED ORDER — SERTRALINE HCL 50 MG PO TABS: 50.0000 mg | ORAL_TABLET | Freq: Every day | ORAL | 1 refills | Status: DC

## 2022-03-15 NOTE — Patient Instructions (Signed)
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
## 2022-03-16 ENCOUNTER — Encounter: Payer: Self-pay | Admitting: Oncology

## 2022-03-16 ENCOUNTER — Inpatient Hospital Stay: Payer: Managed Care, Other (non HMO) | Attending: Oncology | Admitting: Oncology

## 2022-03-16 VITALS — BP 118/77 | HR 106 | Temp 98.5°F | Resp 16 | Wt 181.0 lb

## 2022-03-16 DIAGNOSIS — C50912 Malignant neoplasm of unspecified site of left female breast: Secondary | ICD-10-CM | POA: Diagnosis present

## 2022-03-16 DIAGNOSIS — Z853 Personal history of malignant neoplasm of breast: Secondary | ICD-10-CM

## 2022-03-16 DIAGNOSIS — Z170 Estrogen receptor positive status [ER+]: Secondary | ICD-10-CM | POA: Diagnosis not present

## 2022-03-16 DIAGNOSIS — Z923 Personal history of irradiation: Secondary | ICD-10-CM | POA: Insufficient documentation

## 2022-03-16 DIAGNOSIS — N920 Excessive and frequent menstruation with regular cycle: Secondary | ICD-10-CM | POA: Insufficient documentation

## 2022-03-16 DIAGNOSIS — Z5181 Encounter for therapeutic drug level monitoring: Secondary | ICD-10-CM | POA: Diagnosis not present

## 2022-03-16 DIAGNOSIS — Z08 Encounter for follow-up examination after completed treatment for malignant neoplasm: Secondary | ICD-10-CM | POA: Diagnosis not present

## 2022-03-16 DIAGNOSIS — Z79810 Long term (current) use of selective estrogen receptor modulators (SERMs): Secondary | ICD-10-CM | POA: Diagnosis not present

## 2022-03-16 LAB — HM COLONOSCOPY

## 2022-03-16 NOTE — Progress Notes (Signed)
Hematology/Oncology Consult note [facility]  [phone] [phone]  Patient Care Team: Juline Patch, MD as PCP - General (Family Medicine)   Name of the patient: Tiffany Humphrey  [id number]  1971-03-16   Date of visit: 03/16/22  Diagnosis- stage I ER positive left breast cancer in 2019 currently on tamoxifen    Chief complaint/ Reason for visit-routine follow-up of breast cancer on tamoxifen  Heme/Onc history: patient is a 50- year-old female whose mother was recently diagnosed with DCIS.  She recently underwent a screening mammogram which showed abnormal calcifications in her left breast.  Diagnostic mammogram and ultrasound confirmed 9 x 5 x 5 mm calcifications.  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  There was a single focus worrisome but not diagnostic for microinvasion.  Patient has met with surgery and is tentatively scheduled to undergo lumpectomy and sentinel lymph node biopsy next week.  Patient states that she has used birth control for the last 25 years.  She used to have regular.  But about 5 months ago she did not have her menstrual cycles for about 5 months.  At that point her birth control was temporarily held and she did have her menstrual cycle after 5 months.  She did restart her birth control at that point.  She feels well today and denies any complaints.  Her appetite is good and she denies any unintentional weight loss. She is G1P1L1.  No other family history of breast cancer other than her mother had DCIS.   Patient had a biopsy on 06/22/2018.  Final pathology showed invasive mammary carcinoma 5 mm grade 2 ER greater than 90% positive PR 51 to 90% positive and HER-2/neu negative with negative margins.  Sentinel lymph node was negative for malignancy.  This was associated with high-grade DCIS.   oncotype score came back at 16. Plan is to proceed with OS plus AI.  However patient had significant headache  with the first dose of Lupron to the point that she could not function and headache persisted for almost a month.  Ovarian suppression was discontinued and patient was switched to tamoxifen since February 2020.  Adjuvant radiation treatment completed in January 2020.  Interval history-patient has not had any menstrual cycle for over 1 year now.  Left breast pain has improved after she underwent surgery for scar removal from lumpectomy.  Tolerating tamoxifen well without any significant side effects.  Appetite and weight have remained stable.  Denies any new complaints at this time  ECOG PS- 0 Pain scale- 0   Review of systems- Review of Systems  Constitutional:  Negative for chills, fever, malaise/fatigue and weight loss.  HENT:  Negative for congestion, ear discharge and nosebleeds.   Eyes:  Negative for blurred vision.  Respiratory:  Negative for cough, hemoptysis, sputum production, shortness of breath and wheezing.   Cardiovascular:  Negative for chest pain, palpitations, orthopnea and claudication.  Gastrointestinal:  Negative for abdominal pain, blood in stool, constipation, diarrhea, heartburn, melena, nausea and vomiting.  Genitourinary:  Negative for dysuria, flank pain, frequency, hematuria and urgency.  Musculoskeletal:  Negative for back pain, joint pain and myalgias.  Skin:  Negative for rash.  Neurological:  Negative for dizziness, tingling, focal weakness, seizures, weakness and headaches.  Endo/Heme/Allergies:  Does not bruise/bleed easily.  Psychiatric/Behavioral:  Negative for depression and suicidal ideas. The patient does not have insomnia.       Allergies  Allergen Reactions   Quinolones  Hives   Clindamycin Hives   Codeine Other (See Comments) and Palpitations    tachycardia     Past Medical History:  Diagnosis Date   Anxiety    Breast cancer ([place]) 06/2018   left breast   Cancer ([place])    LEFT BREAST    Complication of anesthesia    panic attacks   GERD  (gastroesophageal reflux disease)    Headache    migraines/stress, optical migraines   History of kidney stones    Hypertension    Multilevel degenerative disc disease    Personal history of radiation therapy    PONV (postoperative nausea and vomiting)    Vertigo    last episode 10/2016-NO PROBLEMS SINCE SINUS SURGERY IN 2018   Wears contact lenses      Past Surgical History:  Procedure Laterality Date   BREAST BIOPSY Right 06/08/2016   CYSTIC PAPILLARY APOCRINE METAPLASIA.    BREAST BIOPSY Left 06/06/2018   left breast stereo X clip positive   BREAST EXCISIONAL BIOPSY Left 06/22/2018   lumpectomy with sn and nl   BREAST EXCISIONAL BIOPSY Left 05/31/2021   removal of fat necrosis due to pain   BREAST LUMPECTOMY Left 06/22/2018   CYSTOSTOMY Right 05/10/2019   Procedure: RIGHT OVARIAN CYSTOSTOMY;  Surgeon: Ouida Sills Gwen Her, MD;  Location: [facility];  Service: Gynecology;  Laterality: Right;   FRONTAL SINUS EXPLORATION Bilateral 12/22/2016   Procedure: FRONTAL SINUS EXPLORATION;  Surgeon: Margaretha Sheffield, MD;  Location: [place];  Service: ENT;  Laterality: Bilateral;   IMAGE GUIDED SINUS SURGERY N/A 12/22/2016   Procedure: IMAGE GUIDED SINUS SURGERY;  Surgeon: Margaretha Sheffield, MD;  Location: [place];  Service: ENT;  Laterality: N/A;   LAPAROSCOPIC TUBAL LIGATION Bilateral 05/10/2019   Procedure: LAPAROSCOPIC TUBAL LIGATION, falope rings Right fallope ring  Left tubal cautery;  Surgeon: Boykin Nearing, MD;  Location: [facility];  Service: Gynecology;  Laterality: Bilateral;   MASS EXCISION Left 05/31/2021   Procedure: EXCISION MASS (scar);  Surgeon: Herbert Pun, MD;  Location: [facility];  Service: General;  Laterality: Left;   MAXILLARY ANTROSTOMY Bilateral 12/22/2016   Procedure: MAXILLARY ANTROSTOMY;  Surgeon: Margaretha Sheffield, MD;  Location: [place];  Service: ENT;  Laterality: Bilateral;   PARTIAL MASTECTOMY WITH NEEDLE LOCALIZATION  Left 06/22/2018   Procedure: PARTIAL MASTECTOMY WITH NEEDLE LOCALIZATION;  Surgeon: Herbert Pun, MD;  Location: [facility];  Service: General;  Laterality: Left;   SENTINEL NODE BIOPSY Left 06/22/2018   Procedure: SENTINEL NODE BIOPSY;  Surgeon: Herbert Pun, MD;  Location: [facility];  Service: General;  Laterality: Left;   SEPTOPLASTY N/A 12/22/2016   Procedure: SEPTOPLASTY;  Surgeon: Margaretha Sheffield, MD;  Location: [place];  Service: ENT;  Laterality: N/A;  GAVE DISK TO CECE 4-5 KP   TURBINATE REDUCTION N/A 12/22/2016   Procedure: TURBINATE REDUCTION partial inferior;  Surgeon: Margaretha Sheffield, MD;  Location: [place];  Service: ENT;  Laterality: N/A;    Social History   Socioeconomic History   Marital status: Married    Spouse name: Not on file   Number of children: Not on file   Years of education: Not on file   Highest education level: Not on file  Occupational History   Not on file  Tobacco Use   Smoking status: Former    Packs/day: 1.00    Years: 15.00    Total pack years: 15.00    Types: Cigarettes    Quit date: 2013  Years since quitting: 10.5   Smokeless tobacco: Never  Vaping Use   Vaping Use: Former  Substance and Sexual Activity   Alcohol use: Yes    Alcohol/week: 2.0 standard drinks of alcohol    Types: 1 Glasses of wine, 1 Cans of beer per week    Comment: social (4-6 drinks/month)   Drug use: No   Sexual activity: Yes  Other Topics Concern   Not on file  Social History Narrative   Lives at home with hubby   Social Determinants of Health   Financial Resource Strain: Not on file  Food Insecurity: Not on file  Transportation Needs: Not on file  Physical Activity: Not on file  Stress: Not on file  Social Connections: Not on file  Intimate Partner Violence: Not on file    Family History  Problem Relation Age of Onset   Breast cancer Mother        dx 68s; 2nd primary at 34   Breast cancer Paternal Grandmother         dx 41s; deceased 67s   Breast cancer Paternal Aunt        dx 27s; currently 8s   Breast cancer Cousin        dx 62s; currently late 63s; daughter of a paternal uncle   Lung cancer Paternal Uncle        smoker; deceased     Current Outpatient Medications:    calcium-vitamin D (OSCAL WITH D) 500-200 MG-UNIT tablet, Take 1 tablet by mouth., Disp: , Rfl:    Cyanocobalamin (B-12) 2500 MCG SUBL, Place 2,500 mcg under the tongue daily., Disp: , Rfl:    hydrochlorothiazide (MICROZIDE) 12.5 MG capsule, Take 1 capsule (12.5 mg total) by mouth daily., Disp: 90 capsule, Rfl: 1   ibuprofen (ADVIL) 800 MG tablet, Take 800 mg by mouth every 8 (eight) hours as needed for headache., Disp: , Rfl:    levocetirizine (XYZAL) 5 MG tablet, Take 5 mg by mouth daily. , Disp: , Rfl:    meclizine (ANTIVERT) 25 MG tablet, Take 25 mg by mouth 3 (three) times daily as needed for dizziness., Disp: , Rfl:    naproxen sodium (ALEVE) 220 MG tablet, Take 440 mg by mouth 2 (two) times daily as needed (pain.)., Disp: , Rfl:    NON FORMULARY, Place 3 drops under the tongue at bedtime. Allergy Oral Drops made by [facility], Disp: , Rfl:    omeprazole (PRILOSEC) 40 MG capsule, Take 1 capsule (40 mg total) by mouth daily., Disp: 90 capsule, Rfl: 1   sertraline (ZOLOFT) 50 MG tablet, Take 1 tablet (50 mg total) by mouth daily., Disp: 90 tablet, Rfl: 1   tamoxifen (NOLVADEX) 20 MG tablet, TAKE 1 TABLET BY MOUTH EVERY DAY, Disp: 90 tablet, Rfl: 1   traMADol (ULTRAM) 50 MG tablet, Take 50 mg by mouth every 6 (six) hours as needed (pain)., Disp: , Rfl:    traZODone (DESYREL) 50 MG tablet, Take 1 tablet (50 mg total) by mouth at bedtime., Disp: 90 tablet, Rfl: 1   triamcinolone (NASACORT) 55 MCG/ACT AERO nasal inhaler, Place 1 spray into the nose daily., Disp: , Rfl:    ALPRAZolam (XANAX) 0.25 MG tablet, Take 1 tablet (0.25 mg total) by mouth daily as needed for anxiety. (Patient not taking: Reported on 09/14/2021), Disp: 30  tablet, Rfl: 0   ezetimibe (ZETIA) 10 MG tablet, Take 10 mg by mouth daily. (Patient not taking: Reported on 03/15/2022), Disp: , Rfl:   Physical  exam:  Vitals:   03/16/22 1125  BP: 118/77  Pulse: (!) 106  Resp: 16  Temp: 98.5 F (36.9 C)  SpO2: 97%  Weight: 181 lb (82.1 kg)   Physical Exam Constitutional:      General: She is not in acute distress. Cardiovascular:     Rate and Rhythm: Normal rate and regular rhythm.     Heart sounds: Normal heart sounds.  Pulmonary:     Effort: Pulmonary effort is normal.     Breath sounds: Normal breath sounds.  Skin:    General: Skin is warm and dry.  Neurological:     Mental Status: She is alert and oriented to person, place, and time.     Breast exam was performed in seated and lying down position. Patient is status post left lumpectomy with a well-healed surgical scar. No evidence of any palpable masses. No evidence of axillary adenopathy. No evidence of any palpable masses or lumps in the right breast. No evidence of right axillary adenopathy       Latest Ref Rng & Units 09/13/2021   10:46 AM  CMP  Glucose 70 - 99 mg/dL 90   BUN 6 - 24 mg/dL 13   Creatinine 0.57 - 1.00 mg/dL 0.76   Sodium 134 - 144 mmol/L 142   Potassium 3.5 - 5.2 mmol/L 4.4   Chloride 96 - 106 mmol/L 103   CO2 20 - 29 mmol/L 23   Calcium 8.7 - 10.2 mg/dL 9.6   Total Protein 6.0 - 8.5 g/dL 7.3   Total Bilirubin 0.0 - 1.2 mg/dL 0.5   Alkaline Phos 44 - 121 IU/L 65   AST 0 - 40 IU/L 26   ALT 0 - 32 IU/L 26       Latest Ref Rng & Units 05/31/2021   10:38 AM  CBC  Hemoglobin 12.0 - 15.0 g/dL 15.3   Hematocrit 36.0 - 46.0 % 45.0     No images are attached to the encounter.  MM 3D SCREEN BREAST BILATERAL  Result Date: 03/10/2022 CLINICAL DATA:  Screening. EXAM: DIGITAL SCREENING BILATERAL MAMMOGRAM WITH TOMOSYNTHESIS AND CAD TECHNIQUE: Bilateral screening digital craniocaudal and mediolateral oblique mammograms were obtained. Bilateral screening digital  breast tomosynthesis was performed. The images were evaluated with computer-aided detection. COMPARISON:  Previous exam(s). ACR Breast Density Category b: There are scattered areas of fibroglandular density. FINDINGS: There are no findings suspicious for malignancy. IMPRESSION: No mammographic evidence of malignancy. A result letter of this screening mammogram will be mailed directly to the patient. RECOMMENDATION: Screening mammogram in one year. (Code:SM-B-01Y) BI-RADS CATEGORY  1: Negative. Electronically Signed   By: Dorise Bullion III M.D.   On: 03/10/2022 22:12   DG Bone Density  Result Date: 03/10/2022 EXAM: DUAL X-RAY ABSORPTIOMETRY (DXA) FOR BONE MINERAL DENSITY IMPRESSION: Dear Dr. Janese Banks, Your patient Emmaline Kluver A Vasbinder completed a FRAX assessment on 03/10/2022 using the [place] (analysis version: 14.10) manufactured by [employer]. The following summarizes the results of our evaluation. PATIENT BIOGRAPHICAL: Name: Neli, Fofana Patient ID: [id number] Birth Date: 03-08-1971 Height:    65.0 in. Gender:     Female    Age:        51.4       Weight:    182.0 lbs. Ethnicity:  White                            Exam Date: 03/10/2022 FRAX* RESULTS:  (  version: 3.5) 10-year Probability of Earnest Rosier Major Osteoporotic Fracture2 Hip Fracture 5.6% 0.8% Population: [place] (Caucasian) Risk Factors: None Based on Femur (Right) Neck BMD 1 -The 10-year probability of fracture may be lower than reported if the patient has received treatment. 2 -Major Osteoporotic Fracture: Clinical Spine, Forearm, Hip or Shoulder *FRAX is a Materials engineer of the [employer] for Metabolic Bone Disease, a [place] (WHO) [facility]. ASSESSMENT: The probability of a major osteoporotic fracture is 5.6% within the next ten years. The probability of a hip fracture is 0.8% within the next ten years. . Your patient Gwendalynn Eckstrom completed a BMD test on 03/10/2022 using the [employer] (software version: 14.10) manufactured by [employer]. The following summarizes the results of our evaluation. Technologist: [facility] PATIENT BIOGRAPHICAL: Name: Egan, Sahlin Patient ID: [id number] Birth Date: Nov 01, 1970 Height: 65.0 in. Gender: Female Exam Date: 03/10/2022 Weight: 182.0 lbs. Indications: Caucasian, High Risk Meds, History of Breast Cancer, History of Radiation, Postmenopausal Fractures: Treatments: Calcium, Tamoxifen DENSITOMETRY RESULTS: Site      Region     Measured Date Measured Age WHO Classification Young Adult T-score BMD         %Change vs. Previous Significant Change (*) AP Spine L1-L2 03/10/2022 50.4 Normal -0.9 1.070 g/cm2 0.1% - AP Spine L1-L2 10/10/2018 47.0 Normal -0.9 1.069 g/cm2 - - DualFemur Neck Right 03/10/2022 50.4 Osteopenia -2.1 0.747 g/cm2 -3.2% - DualFemur Neck Right 10/10/2018 47.0 Osteopenia -1.9 0.772 g/cm2 - - DualFemur Total Mean 03/10/2022 50.4 Osteopenia -1.5 0.823 g/cm2 1.2% - DualFemur Total Mean 10/10/2018 47.0 Osteopenia -1.5 0.813 g/cm2 - - ASSESSMENT: The BMD measured at Femur Neck Right is 0.747 g/cm2 with a T-score of -2.1. This patient is considered osteopenic according to [facility]) criteria. The scan quality is good. L-3 & 4 was excluded due to degenerative changes. Compared with prior study, there has been no significant change in the spine. Compared with prior study, there has been no significant change in the total hip. World Pharmacologist [facility]) criteria for post-menopausal, Caucasian Women: Normal:                   T-score at or above -1 SD Osteopenia/low bone mass: T-score between -1 and -2.5 SD Osteoporosis:             T-score at or below -2.5 SD RECOMMENDATIONS: 1. All patients should optimize calcium and vitamin D intake. 2. Consider FDA-approved medical therapies in postmenopausal women and men aged 17 years and older, based on the following: a. A hip or vertebral(clinical or morphometric)  fracture b. T-score < -2.5 at the femoral neck or spine after appropriate evaluation to exclude secondary causes c. Low bone mass (T-score between -1.0 and -2.5 at the femoral neck or spine) and a 10-year probability of a hip fracture > 3% or a 10-year probability of a major osteoporosis-related fracture > 20% based on the [place]-adapted WHO algorithm 3. Clinician judgment and/or patient preferences may indicate treatment for people with 10-year fracture probabilities above or below these levels FOLLOW-UP: People with diagnosed cases of osteoporosis or at high risk for fracture should have regular bone mineral density tests. For patients eligible for Medicare, routine testing is allowed once every 2 years. The testing frequency can be increased to one year for patients who have rapidly progressing disease, those who are receiving or discontinuing medical therapy to restore bone mass, or have additional risk factors. I have  reviewed this report, and agree with the above findings. Mark A. Thornton Papas, M.D. [facility] Electronically Signed   By: Lavonia Dana M.D.   On: 03/10/2022 09:18     Assessment and plan- Patient is a 51 y.o. female with stage I invasive mammary carcinoma of the left breast ER/PR positive HER2 negative s/p surgery, adjuvantRadiation and presently on tamoxifen here for routine follow-up  Clinically patient is doing well with no concerning signs and symptoms of recurrence based on today's exam.  Patient ideally needs to take endocrine therapy for 10 years with at least 5 years ending in February 2025.  She is tolerating tamoxifen well without any significant side effects.  Although she is postmenopausal at this time I am not switching her to AI and continuing with tamoxifen.  Recent mammogram from July 2023 was unremarkable.  She also had a bone density scan which shows stable osteopenia involving the right hip but does not require the use of adjuvant bisphosphonates.  She will continue  calcium 1200 mg along with vitamin D 800 international units   Visit Diagnosis 1. Encounter for monitoring tamoxifen therapy   2. Encounter for follow-up surveillance of breast cancer      Dr. Randa Evens, MD, MPH [facility] at [facility] [phone] 03/16/2022 2:25 PM

## 2022-03-18 ENCOUNTER — Ambulatory Visit
Admission: RE | Admit: 2022-03-18 | Discharge: 2022-03-18 | Disposition: A | Discharge status: Home / Self Care | Payer: Managed Care, Other (non HMO) | Source: Ambulatory Visit | Attending: Family Medicine | Admitting: Family Medicine

## 2022-03-28 HISTORY — PX: COLONOSCOPY: SHX174

## 2022-05-14 ENCOUNTER — Other Ambulatory Visit: Payer: Self-pay | Admitting: Oncology

## 2022-05-16 ENCOUNTER — Encounter: Payer: Self-pay | Admitting: Oncology

## 2022-06-07 ENCOUNTER — Encounter: Payer: Self-pay | Admitting: Family Medicine

## 2022-06-10 ENCOUNTER — Encounter: Payer: Self-pay | Admitting: Oncology

## 2022-06-11 ENCOUNTER — Other Ambulatory Visit: Payer: Self-pay | Admitting: Family Medicine

## 2022-06-11 DIAGNOSIS — E7801 Familial hypercholesterolemia: Secondary | ICD-10-CM

## 2022-06-13 ENCOUNTER — Encounter: Payer: Self-pay | Admitting: Family Medicine

## 2022-06-13 ENCOUNTER — Ambulatory Visit: Payer: Managed Care, Other (non HMO) | Admitting: Family Medicine

## 2022-06-13 VITALS — BP 120/78 | HR 119 | Wt 179.0 lb

## 2022-06-13 DIAGNOSIS — T148XXA Other injury of unspecified body region, initial encounter: Secondary | ICD-10-CM

## 2022-06-13 DIAGNOSIS — M23311 Other meniscus derangements, anterior horn of medial meniscus, right knee: Secondary | ICD-10-CM | POA: Diagnosis not present

## 2022-06-13 NOTE — Progress Notes (Signed)
Date:  06/13/2022   Name:  Tiffany Humphrey   DOB:  11-16-1970   MRN:  [id number]   Chief Complaint: Knee Pain  Knee Pain  The incident occurred more than 1 week ago. Injury mechanism: almost fell/then felt a pop. The pain is present in the right knee. The quality of the pain is described as aching. The pain is moderate. The pain has been Worsening since onset. Associated symptoms include an inability to bear weight and a loss of motion. Associated symptoms comments: Extension block. The symptoms are aggravated by movement, palpation and weight bearing. She has tried NSAIDs for the symptoms. The treatment provided no relief.    Lab Results  Component Value Date   NA 142 09/13/2021   K 4.4 09/13/2021   CO2 23 09/13/2021   GLUCOSE 90 09/13/2021   BUN 13 09/13/2021   CREATININE 0.76 09/13/2021   CALCIUM 9.6 09/13/2021   EGFR 96 09/13/2021   GFRNONAA >60 09/03/2020   Lab Results  Component Value Date   CHOL 177 09/13/2021   HDL 63 09/13/2021   LDLCALC 88 09/13/2021   TRIG 150 (H) 09/13/2021   Lab Results  Component Value Date   TSH 1.730 07/01/2019   No results found for: "HGBA1C" Lab Results  Component Value Date   WBC 10.0 08/24/2020   HGB 15.3 (H) 05/31/2021   HCT 45.0 05/31/2021   MCV 86.8 08/24/2020   PLT 318 08/24/2020   Lab Results  Component Value Date   ALT 26 09/13/2021   AST 26 09/13/2021   ALKPHOS 65 09/13/2021   BILITOT 0.5 09/13/2021   No results found for: "25OHVITD2", "25OHVITD3", "VD25OH"   Review of Systems  Constitutional: Negative.  Negative for chills, fatigue, fever and unexpected weight change.  HENT:  Negative for congestion, ear discharge, ear pain, rhinorrhea, sinus pressure, sneezing and sore throat.   Respiratory:  Negative for cough, shortness of breath, wheezing and stridor.   Gastrointestinal:  Negative for abdominal pain, blood in stool, constipation, diarrhea and nausea.  Genitourinary:  Negative for dysuria, flank pain,  frequency, hematuria, urgency and vaginal discharge.  Musculoskeletal:  Negative for arthralgias, back pain and myalgias.  Skin:  Negative for rash.  Neurological:  Negative for dizziness, weakness and headaches.  Hematological:  Negative for adenopathy. Does not bruise/bleed easily.  Psychiatric/Behavioral:  Negative for dysphoric mood. The patient is not nervous/anxious.     Patient Active Problem List   Diagnosis Date Noted   Menorrhagia with regular cycle 03/16/2022   Lymphedema of breast 01/29/2019   Genetic testing 01/14/2019   Malignant neoplasm of upper-outer quadrant of left breast in female, estrogen receptor positive ([place]) 07/06/2018   Breast cancer ([place]) 06/18/2018   Migraines 08/03/2015    Allergies  Allergen Reactions   Quinolones Hives   Clindamycin Hives   Codeine Other (See Comments) and Palpitations    tachycardia    Past Surgical History:  Procedure Laterality Date   BREAST BIOPSY Right 06/08/2016   CYSTIC PAPILLARY APOCRINE METAPLASIA.    BREAST BIOPSY Left 06/06/2018   left breast stereo X clip positive   BREAST EXCISIONAL BIOPSY Left 06/22/2018   lumpectomy with sn and nl   BREAST EXCISIONAL BIOPSY Left 05/31/2021   removal of fat necrosis due to pain   BREAST LUMPECTOMY Left 06/22/2018   CYSTOSTOMY Right 05/10/2019   Procedure: RIGHT OVARIAN CYSTOSTOMY;  Surgeon: Ouida Sills Gwen Her, MD;  Location: [facility];  Service: Gynecology;  Laterality: Right;   FRONTAL  SINUS EXPLORATION Bilateral 12/22/2016   Procedure: FRONTAL SINUS EXPLORATION;  Surgeon: Margaretha Sheffield, MD;  Location: [place];  Service: ENT;  Laterality: Bilateral;   IMAGE GUIDED SINUS SURGERY N/A 12/22/2016   Procedure: IMAGE GUIDED SINUS SURGERY;  Surgeon: Margaretha Sheffield, MD;  Location: [place];  Service: ENT;  Laterality: N/A;   LAPAROSCOPIC TUBAL LIGATION Bilateral 05/10/2019   Procedure: LAPAROSCOPIC TUBAL LIGATION, falope rings Right fallope ring  Left tubal  cautery;  Surgeon: Boykin Nearing, MD;  Location: [facility];  Service: Gynecology;  Laterality: Bilateral;   MASS EXCISION Left 05/31/2021   Procedure: EXCISION MASS (scar);  Surgeon: Herbert Pun, MD;  Location: [facility];  Service: General;  Laterality: Left;   MAXILLARY ANTROSTOMY Bilateral 12/22/2016   Procedure: MAXILLARY ANTROSTOMY;  Surgeon: Margaretha Sheffield, MD;  Location: [place];  Service: ENT;  Laterality: Bilateral;   PARTIAL MASTECTOMY WITH NEEDLE LOCALIZATION Left 06/22/2018   Procedure: PARTIAL MASTECTOMY WITH NEEDLE LOCALIZATION;  Surgeon: Herbert Pun, MD;  Location: [facility];  Service: General;  Laterality: Left;   SENTINEL NODE BIOPSY Left 06/22/2018   Procedure: SENTINEL NODE BIOPSY;  Surgeon: Herbert Pun, MD;  Location: [facility];  Service: General;  Laterality: Left;   SEPTOPLASTY N/A 12/22/2016   Procedure: SEPTOPLASTY;  Surgeon: Margaretha Sheffield, MD;  Location: [place];  Service: ENT;  Laterality: N/A;  GAVE DISK TO [facility] 4-5 KP   TURBINATE REDUCTION N/A 12/22/2016   Procedure: TURBINATE REDUCTION partial inferior;  Surgeon: Margaretha Sheffield, MD;  Location: [place];  Service: ENT;  Laterality: N/A;    Social History   Tobacco Use   Smoking status: Former    Packs/day: 1.00    Years: 15.00    Total pack years: 15.00    Types: Cigarettes    Quit date: 2013    Years since quitting: 10.7   Smokeless tobacco: Never  Vaping Use   Vaping Use: Former  Substance Use Topics   Alcohol use: Yes    Alcohol/week: 2.0 standard drinks of alcohol    Types: 1 Glasses of wine, 1 Cans of beer per week    Comment: social (4-6 drinks/month)   Drug use: No     Medication list has been reviewed and updated.  Current Meds  Medication Sig   calcium-vitamin D (OSCAL WITH D) 500-200 MG-UNIT tablet Take 1 tablet by mouth.   Cyanocobalamin (B-12) 2500 MCG SUBL Place 2,500 mcg under the tongue daily.   hydrochlorothiazide  (MICROZIDE) 12.5 MG capsule Take 1 capsule (12.5 mg total) by mouth daily.   ibuprofen (ADVIL) 800 MG tablet Take 800 mg by mouth every 8 (eight) hours as needed for headache.   levocetirizine (XYZAL) 5 MG tablet Take 5 mg by mouth daily.    meclizine (ANTIVERT) 25 MG tablet Take 25 mg by mouth 3 (three) times daily as needed for dizziness.   naproxen sodium (ALEVE) 220 MG tablet Take 440 mg by mouth 2 (two) times daily as needed (pain.).   NON FORMULARY Place 3 drops under the tongue at bedtime. Allergy Oral Drops made by [facility]   omeprazole (PRILOSEC) 40 MG capsule Take 1 capsule (40 mg total) by mouth daily.   sertraline (ZOLOFT) 50 MG tablet Take 1 tablet (50 mg total) by mouth daily.   tamoxifen (NOLVADEX) 20 MG tablet TAKE 1 TABLET BY MOUTH EVERY DAY   traMADol (ULTRAM) 50 MG tablet Take 50 mg by mouth every 6 (six) hours as needed (pain).  traZODone (DESYREL) 50 MG tablet Take 1 tablet (50 mg total) by mouth at bedtime.   triamcinolone (NASACORT) 55 MCG/ACT AERO nasal inhaler Place 1 spray into the nose daily.       03/15/2022   11:25 AM 01/12/2022   11:39 AM 12/15/2021   10:27 AM 09/13/2021   10:15 AM  GAD 7 : Generalized Anxiety Score  Nervous, Anxious, on Edge 1 0 0 0  Control/stop worrying 0 0 0 0  Worry too much - different things 2 0 0 0  Trouble relaxing 1 0 0 0  Restless 0 0 0 0  Easily annoyed or irritable 1 0 0 0  Afraid - awful might happen 0 0 0 0  Total GAD 7 Score 5 0 0 0  Anxiety Difficulty Not difficult at all Not difficult at all Not difficult at all Not difficult at all       03/15/2022   11:25 AM 01/12/2022   11:39 AM 12/15/2021   10:26 AM  Depression screen PHQ 2/9  Decreased Interest 0 0 0  Down, Depressed, Hopeless 0 0 0  PHQ - 2 Score 0 0 0  Altered sleeping 0 0 0  Tired, decreased energy 0 0 0  Change in appetite 0 0 0  Feeling bad or failure about yourself  0 0 0  Trouble concentrating 0 0 0  Moving slowly or fidgety/restless 0 0 0   Suicidal thoughts 0 0 0  PHQ-9 Score 0 0 0  Difficult doing work/chores Not difficult at all Not difficult at all Not difficult at all    BP Readings from Last 3 Encounters:  06/13/22 120/78  03/16/22 118/77  03/15/22 120/80    Physical Exam Vitals and nursing note reviewed.  Constitutional:      Appearance: She is well-developed.  HENT:     Head: Normocephalic.     Right Ear: External ear normal.     Left Ear: External ear normal.  Eyes:     General: Lids are everted, no foreign bodies appreciated. No scleral icterus.       Left eye: No foreign body or hordeolum.     Conjunctiva/sclera: Conjunctivae normal.     Right eye: Right conjunctiva is not injected.     Left eye: Left conjunctiva is not injected.     Pupils: Pupils are equal, round, and reactive to light.  Neck:     Thyroid: No thyromegaly.     Vascular: No JVD.     Trachea: No tracheal deviation.  Cardiovascular:     Rate and Rhythm: Normal rate and regular rhythm.     Heart sounds: Normal heart sounds. No murmur heard.    No friction rub. No gallop.  Pulmonary:     Effort: Pulmonary effort is normal. No respiratory distress.     Breath sounds: Normal breath sounds. No wheezing or rales.  Abdominal:     General: Bowel sounds are normal.     Palpations: Abdomen is soft. There is no mass.     Tenderness: There is no abdominal tenderness. There is no guarding or rebound.  Musculoskeletal:     Cervical back: Normal range of motion and neck supple.     Right knee: Effusion present. Decreased range of motion. Tenderness present over the medial joint line and patellar tendon. No lateral joint line, MCL, LCL or ACL tenderness.  Lymphadenopathy:     Cervical: No cervical adenopathy.  Skin:    General: Skin is warm.  Findings: No rash.  Neurological:     Mental Status: She is alert and oriented to person, place, and time.     Cranial Nerves: No cranial nerve deficit.     Coordination: Coordination normal.      Deep Tendon Reflexes: Reflexes normal.  Psychiatric:        Mood and Affect: Mood is not anxious or depressed.     Wt Readings from Last 3 Encounters:  06/13/22 179 lb (81.2 kg)  03/16/22 181 lb (82.1 kg)  03/15/22 181 lb (82.1 kg)    BP 120/78   Pulse (!) 119   Wt 179 lb (81.2 kg)   LMP 05/25/2020 (Approximate)   SpO2 95%   BMI 29.79 kg/m   Assessment and Plan:  1. Subluxation New onset.  Persistent.  Patient almost fell and then felt a pop and then has discomfort along the lateral and medial edge of the patella and tendon as well as the medial joint line.  There is no instability with valgus or varus maneuver.  McMurray's tender on the lateral joint line.  Patient has ibuprofen to take at this time and referral has been made back to her orthopedist.  Discussed with Dr. Candelaria Stagers and he will be calling the patient to get her in this week for evaluation and treatment.  2. Derangement of anterior horn of medial meniscus of right knee Some joint line tenderness and it appears to be some current effusion.  Not certain if this is a hemarthrosis but there is some tenderness in the popliteal to suggest as upcoming evaluation with Dr. Candelaria Stagers is key for diagnostic and therapeutic concerns.   Otilio Miu, MD

## 2022-06-14 ENCOUNTER — Telehealth: Payer: Self-pay

## 2022-06-14 NOTE — Telephone Encounter (Signed)
Called pt with 06/15/22 appt with Dr Candelaria Stagers at [facility]

## 2022-06-16 ENCOUNTER — Encounter: Payer: Self-pay | Admitting: Family Medicine

## 2022-06-16 ENCOUNTER — Encounter: Payer: Self-pay | Admitting: Oncology

## 2022-07-08 ENCOUNTER — Other Ambulatory Visit: Payer: Self-pay | Admitting: Sports Medicine

## 2022-07-08 DIAGNOSIS — G8929 Other chronic pain: Secondary | ICD-10-CM

## 2022-07-08 DIAGNOSIS — S8991XD Unspecified injury of right lower leg, subsequent encounter: Secondary | ICD-10-CM

## 2022-07-08 DIAGNOSIS — M25461 Effusion, right knee: Secondary | ICD-10-CM

## 2022-07-12 ENCOUNTER — Encounter: Payer: Self-pay | Admitting: Oncology

## 2022-07-19 ENCOUNTER — Encounter: Payer: Self-pay | Admitting: Oncology

## 2022-07-19 ENCOUNTER — Ambulatory Visit
Admission: RE | Admit: 2022-07-19 | Discharge: 2022-07-19 | Disposition: A | Discharge status: Home / Self Care | Payer: 59 | Source: Ambulatory Visit | Attending: Sports Medicine | Admitting: Sports Medicine

## 2022-07-19 DIAGNOSIS — S8991XD Unspecified injury of right lower leg, subsequent encounter: Secondary | ICD-10-CM

## 2022-07-19 DIAGNOSIS — G8929 Other chronic pain: Secondary | ICD-10-CM

## 2022-07-19 DIAGNOSIS — M25461 Effusion, right knee: Secondary | ICD-10-CM

## 2022-08-15 ENCOUNTER — Telehealth: Payer: Managed Care, Other (non HMO) | Admitting: Nurse Practitioner

## 2022-08-15 DIAGNOSIS — J40 Bronchitis, not specified as acute or chronic: Secondary | ICD-10-CM | POA: Diagnosis not present

## 2022-08-15 MED ORDER — AZITHROMYCIN 250 MG PO TABS: ORAL_TABLET | ORAL | 0 refills | Status: AC

## 2022-08-15 MED ORDER — BENZONATATE 100 MG PO CAPS: 100.0000 mg | ORAL_CAPSULE | Freq: Three times a day (TID) | ORAL | 0 refills | Status: DC | PRN

## 2022-08-15 NOTE — Progress Notes (Signed)
We are sorry that you are not feeling well.  Here is how we plan to help!  Based on your presentation I believe you most likely have A cough due to bacteria.  When patients have a fever and a productive cough with a change in color or increased sputum production, we are concerned about bacterial bronchitis.  If left untreated it can progress to pneumonia.  If your symptoms do not improve with your treatment plan it is important that you contact your provider.   I have prescribed Azithromyin 250 mg: two tablets now and then one tablet daily for 4 additonal days    In addition you may use A prescription cough medication called Tessalon Perles '100mg'$ . You may take 1-2 capsules every 8 hours as needed for your cough.    From your responses in the eVisit questionnaire you describe inflammation in the upper respiratory tract which is causing a significant cough.  This is commonly called Bronchitis and has four common causes:   Allergies Viral Infections Acid Reflux Bacterial Infection Allergies, viruses and acid reflux are treated by controlling symptoms or eliminating the cause.   HOME CARE Only take medications as instructed by your medical team. Complete the entire course of an antibiotic. Drink plenty of fluids and get plenty of rest. Avoid close contacts especially the very young and the elderly Cover your mouth if you cough or cough into your sleeve. Always remember to wash your hands A steam or ultrasonic humidifier can help congestion.   GET HELP RIGHT AWAY IF: You develop worsening fever. You become short of breath You cough up blood. Your symptoms persist after you have completed your treatment plan MAKE SURE YOU  Understand these instructions. Will watch your condition. Will get help right away if you are not doing well or get worse.    Thank you for choosing an e-visit.  Your e-visit answers were reviewed by a board certified advanced clinical practitioner to complete your  personal care plan. Depending upon the condition, your plan could have included both over the counter or prescription medications.  Please review your pharmacy choice. Make sure the pharmacy is open so you can pick up prescription now. If there is a problem, you may contact your provider through [employer] and have the prescription routed to another pharmacy.  Your safety is important to [place]. If you have drug allergies check your prescription carefully.   For the next 24 hours you can use MyChart to ask questions about today's visit, request a non-urgent call back, or ask for a work or school excuse. You will get an email in the next two days asking about your experience. I hope that your e-visit has been valuable and will speed your recovery.   Meds ordered this encounter  Medications   benzonatate (TESSALON) 100 MG capsule    Sig: Take 1 capsule (100 mg total) by mouth 3 (three) times daily as needed.    Dispense:  30 capsule    Refill:  0   azithromycin (ZITHROMAX) 250 MG tablet    Sig: Take 2 tablets on day 1, then 1 tablet daily on days 2 through 5    Dispense:  6 tablet    Refill:  0    I spent approximately 5 minutes reviewing the patient's history, current symptoms and coordinating their care today.

## 2022-08-31 ENCOUNTER — Encounter: Payer: Self-pay | Admitting: Oncology

## 2022-09-05 DIAGNOSIS — Z719 Counseling, unspecified: Secondary | ICD-10-CM

## 2022-09-06 ENCOUNTER — Ambulatory Visit: Payer: Managed Care, Other (non HMO) | Admitting: Family Medicine

## 2022-09-06 ENCOUNTER — Encounter: Payer: Self-pay | Admitting: Family Medicine

## 2022-09-06 VITALS — BP 120/68 | HR 87 | Ht 65.0 in | Wt 180.0 lb

## 2022-09-06 DIAGNOSIS — F329 Major depressive disorder, single episode, unspecified: Secondary | ICD-10-CM

## 2022-09-06 DIAGNOSIS — R601 Generalized edema: Secondary | ICD-10-CM | POA: Diagnosis not present

## 2022-09-06 DIAGNOSIS — E7801 Familial hypercholesterolemia: Secondary | ICD-10-CM

## 2022-09-06 DIAGNOSIS — R030 Elevated blood-pressure reading, without diagnosis of hypertension: Secondary | ICD-10-CM | POA: Diagnosis not present

## 2022-09-06 DIAGNOSIS — K219 Gastro-esophageal reflux disease without esophagitis: Secondary | ICD-10-CM | POA: Diagnosis not present

## 2022-09-06 DIAGNOSIS — F5101 Primary insomnia: Secondary | ICD-10-CM

## 2022-09-06 DIAGNOSIS — F419 Anxiety disorder, unspecified: Secondary | ICD-10-CM

## 2022-09-06 MED ORDER — TRAZODONE HCL 50 MG PO TABS: 50.0000 mg | ORAL_TABLET | Freq: Every day | ORAL | 1 refills | Status: DC

## 2022-09-06 MED ORDER — SERTRALINE HCL 50 MG PO TABS: 50.0000 mg | ORAL_TABLET | Freq: Every day | ORAL | 1 refills | Status: DC

## 2022-09-06 MED ORDER — HYDROCHLOROTHIAZIDE 12.5 MG PO CAPS: 12.5000 mg | ORAL_CAPSULE | Freq: Every day | ORAL | 1 refills | Status: DC

## 2022-09-06 MED ORDER — OMEPRAZOLE 40 MG PO CPDR: 40.0000 mg | DELAYED_RELEASE_CAPSULE | Freq: Every day | ORAL | 1 refills | Status: DC

## 2022-09-06 NOTE — Patient Instructions (Signed)

## 2022-09-06 NOTE — Progress Notes (Signed)
Date:  09/06/2022   Name:  Tiffany Humphrey   DOB:  16-Oct-1970   MRN:  [id number]   Chief Complaint: Hypertension, Gastroesophageal Reflux, Depression, and Insomnia  Hypertension This is a chronic problem. The current episode started more than 1 year ago. The problem has been gradually improving since onset. The problem is controlled. Pertinent negatives include no anxiety, blurred vision, chest pain, headaches, orthopnea, palpitations, peripheral edema, PND or shortness of breath. There are no associated agents to hypertension. There are no known risk factors for coronary artery disease. Past treatments include diuretics. The current treatment provides moderate improvement. There are no compliance problems.  There is no history of angina, kidney disease, CAD/MI, CVA, heart failure, left ventricular hypertrophy, PVD or retinopathy. There is no history of chronic renal disease, a hypertension causing med or renovascular disease.  Gastroesophageal Reflux She reports no abdominal pain, no chest pain, no choking, no coughing, no dysphagia, no heartburn, no nausea, no sore throat or no wheezing. This is a chronic problem. The current episode started more than 1 year ago. The problem occurs frequently. The problem has been gradually improving. The symptoms are aggravated by certain foods. Pertinent negatives include no fatigue or melena. She has tried a PPI for the symptoms. The treatment provided moderate relief.  Depression        This is a chronic problem.  The current episode started more than 1 year ago.   The problem has been gradually improving since onset.  Associated symptoms include insomnia.  Associated symptoms include no decreased concentration, no fatigue, no helplessness, no hopelessness, not irritable, no restlessness, no decreased interest, no appetite change, no body aches, no myalgias, no headaches, no indigestion, not sad and no suicidal ideas.  Past treatments include SSRIs - Selective  serotonin reuptake inhibitors.  Compliance with treatment is good.   Pertinent negatives include no anxiety. Insomnia Primary symptoms: no fragmented sleep, no sleep disturbance, no difficulty falling asleep, no somnolence.   The current episode started more than one year. The problem has been gradually improving since onset. The symptoms are aggravated by SSRI use. PMH includes: depression.     Lab Results  Component Value Date   NA 142 09/13/2021   K 4.4 09/13/2021   CO2 23 09/13/2021   GLUCOSE 90 09/13/2021   BUN 13 09/13/2021   CREATININE 0.76 09/13/2021   CALCIUM 9.6 09/13/2021   EGFR 96 09/13/2021   GFRNONAA >60 09/03/2020   Lab Results  Component Value Date   CHOL 177 09/13/2021   HDL 63 09/13/2021   LDLCALC 88 09/13/2021   TRIG 150 (H) 09/13/2021   Lab Results  Component Value Date   TSH 1.730 07/01/2019   No results found for: "HGBA1C" Lab Results  Component Value Date   WBC 10.0 08/24/2020   HGB 15.3 (H) 05/31/2021   HCT 45.0 05/31/2021   MCV 86.8 08/24/2020   PLT 318 08/24/2020   Lab Results  Component Value Date   ALT 26 09/13/2021   AST 26 09/13/2021   ALKPHOS 65 09/13/2021   BILITOT 0.5 09/13/2021   No results found for: "25OHVITD2", "25OHVITD3", "VD25OH"   Review of Systems  Constitutional: Negative.  Negative for appetite change, chills, fatigue, fever and unexpected weight change.  HENT:  Negative for congestion, ear discharge, ear pain, rhinorrhea, sinus pressure, sneezing and sore throat.   Eyes:  Negative for blurred vision.  Respiratory:  Negative for cough, choking, shortness of breath, wheezing and stridor.  Cardiovascular:  Negative for chest pain, palpitations, orthopnea and PND.  Gastrointestinal:  Negative for abdominal pain, blood in stool, constipation, diarrhea, dysphagia, heartburn, melena and nausea.  Genitourinary:  Negative for dysuria, flank pain, frequency, hematuria, urgency and vaginal discharge.  Musculoskeletal:  Negative  for arthralgias, back pain and myalgias.  Skin:  Negative for rash.  Neurological:  Negative for dizziness, weakness and headaches.  Hematological:  Negative for adenopathy. Does not bruise/bleed easily.  Psychiatric/Behavioral:  Positive for depression. Negative for decreased concentration, dysphoric mood, sleep disturbance and suicidal ideas. The patient has insomnia. The patient is not nervous/anxious.     Patient Active Problem List   Diagnosis Date Noted   Menorrhagia with regular cycle 03/16/2022   Lymphedema of breast 01/29/2019   Genetic testing 01/14/2019   Malignant neoplasm of upper-outer quadrant of left breast in female, estrogen receptor positive ([place]) 07/06/2018   Breast cancer ([place]) 06/18/2018   Migraines 08/03/2015    Allergies  Allergen Reactions   Quinolones Hives   Clindamycin Hives   Codeine Other (See Comments) and Palpitations    tachycardia    Past Surgical History:  Procedure Laterality Date   BREAST BIOPSY Right 06/08/2016   CYSTIC PAPILLARY APOCRINE METAPLASIA.    BREAST BIOPSY Left 06/06/2018   left breast stereo X clip positive   BREAST EXCISIONAL BIOPSY Left 06/22/2018   lumpectomy with sn and nl   BREAST EXCISIONAL BIOPSY Left 05/31/2021   removal of fat necrosis due to pain   BREAST LUMPECTOMY Left 06/22/2018   CYSTOSTOMY Right 05/10/2019   Procedure: RIGHT OVARIAN CYSTOSTOMY;  Surgeon: Ouida Sills Gwen Her, MD;  Location: [facility];  Service: Gynecology;  Laterality: Right;   FRONTAL SINUS EXPLORATION Bilateral 12/22/2016   Procedure: FRONTAL SINUS EXPLORATION;  Surgeon: Margaretha Sheffield, MD;  Location: [place];  Service: ENT;  Laterality: Bilateral;   IMAGE GUIDED SINUS SURGERY N/A 12/22/2016   Procedure: IMAGE GUIDED SINUS SURGERY;  Surgeon: Margaretha Sheffield, MD;  Location: [place];  Service: ENT;  Laterality: N/A;   LAPAROSCOPIC TUBAL LIGATION Bilateral 05/10/2019   Procedure: LAPAROSCOPIC TUBAL LIGATION, falope rings  Right fallope ring  Left tubal cautery;  Surgeon: Boykin Nearing, MD;  Location: [facility];  Service: Gynecology;  Laterality: Bilateral;   MASS EXCISION Left 05/31/2021   Procedure: EXCISION MASS (scar);  Surgeon: Herbert Pun, MD;  Location: [facility];  Service: General;  Laterality: Left;   MAXILLARY ANTROSTOMY Bilateral 12/22/2016   Procedure: MAXILLARY ANTROSTOMY;  Surgeon: Margaretha Sheffield, MD;  Location: [place];  Service: ENT;  Laterality: Bilateral;   PARTIAL MASTECTOMY WITH NEEDLE LOCALIZATION Left 06/22/2018   Procedure: PARTIAL MASTECTOMY WITH NEEDLE LOCALIZATION;  Surgeon: Herbert Pun, MD;  Location: [facility];  Service: General;  Laterality: Left;   SENTINEL NODE BIOPSY Left 06/22/2018   Procedure: SENTINEL NODE BIOPSY;  Surgeon: Herbert Pun, MD;  Location: [facility];  Service: General;  Laterality: Left;   SEPTOPLASTY N/A 12/22/2016   Procedure: SEPTOPLASTY;  Surgeon: Margaretha Sheffield, MD;  Location: [place];  Service: ENT;  Laterality: N/A;  GAVE DISK TO [facility] 4-5 KP   TURBINATE REDUCTION N/A 12/22/2016   Procedure: TURBINATE REDUCTION partial inferior;  Surgeon: Margaretha Sheffield, MD;  Location: [place];  Service: ENT;  Laterality: N/A;    Social History   Tobacco Use   Smoking status: Former    Packs/day: 1.00    Years: 15.00    Total pack years: 15.00    Types: Cigarettes  Quit date: 2013    Years since quitting: 11.0   Smokeless tobacco: Never  Vaping Use   Vaping Use: Former  Substance Use Topics   Alcohol use: Yes    Alcohol/week: 2.0 standard drinks of alcohol    Types: 1 Glasses of wine, 1 Cans of beer per week    Comment: social (4-6 drinks/month)   Drug use: No     Medication list has been reviewed and updated.  Current Meds  Medication Sig   calcium-vitamin D (OSCAL WITH D) 500-200 MG-UNIT tablet Take 1 tablet by mouth.   Cyanocobalamin (B-12) 2500 MCG SUBL Place 2,500 mcg under the tongue  daily.   hydrochlorothiazide (MICROZIDE) 12.5 MG capsule Take 1 capsule (12.5 mg total) by mouth daily.   ibuprofen (ADVIL) 800 MG tablet Take 800 mg by mouth every 8 (eight) hours as needed for headache.   levocetirizine (XYZAL) 5 MG tablet Take 5 mg by mouth daily.    meclizine (ANTIVERT) 25 MG tablet Take 25 mg by mouth 3 (three) times daily as needed for dizziness.   naproxen sodium (ALEVE) 220 MG tablet Take 440 mg by mouth 2 (two) times daily as needed (pain.).   NON FORMULARY Place 3 drops under the tongue at bedtime. Allergy Oral Drops made by [facility]   omeprazole (PRILOSEC) 40 MG capsule Take 1 capsule (40 mg total) by mouth daily.   sertraline (ZOLOFT) 50 MG tablet Take 1 tablet (50 mg total) by mouth daily.   tamoxifen (NOLVADEX) 20 MG tablet TAKE 1 TABLET BY MOUTH EVERY DAY   traMADol (ULTRAM) 50 MG tablet Take 50 mg by mouth every 6 (six) hours as needed (pain).   traZODone (DESYREL) 50 MG tablet Take 1 tablet (50 mg total) by mouth at bedtime.   triamcinolone (NASACORT) 55 MCG/ACT AERO nasal inhaler Place 1 spray into the nose daily.       09/06/2022   10:57 AM 03/15/2022   11:25 AM 01/12/2022   11:39 AM 12/15/2021   10:27 AM  GAD 7 : Generalized Anxiety Score  Nervous, Anxious, on Edge 0 1 0 0  Control/stop worrying 0 0 0 0  Worry too much - different things 0 2 0 0  Trouble relaxing 0 1 0 0  Restless 0 0 0 0  Easily annoyed or irritable 0 1 0 0  Afraid - awful might happen 0 0 0 0  Total GAD 7 Score 0 5 0 0  Anxiety Difficulty Not difficult at all Not difficult at all Not difficult at all Not difficult at all       09/06/2022   10:57 AM 03/15/2022   11:25 AM 01/12/2022   11:39 AM  Depression screen PHQ 2/9  Decreased Interest 0 0 0  Down, Depressed, Hopeless 0 0 0  PHQ - 2 Score 0 0 0  Altered sleeping 0 0 0  Tired, decreased energy 0 0 0  Change in appetite 0 0 0  Feeling bad or failure about yourself  0 0 0  Trouble concentrating 0 0 0  Moving slowly or  fidgety/restless 0 0 0  Suicidal thoughts 0 0 0  PHQ-9 Score 0 0 0  Difficult doing work/chores Not difficult at all Not difficult at all Not difficult at all    BP Readings from Last 3 Encounters:  09/06/22 120/68  06/13/22 120/78  03/16/22 118/77    Physical Exam Vitals and nursing note reviewed. Exam conducted with a chaperone present.  Constitutional:  General: She is not irritable.She is not in acute distress.    Appearance: She is not diaphoretic.  HENT:     Head: Normocephalic and atraumatic.     Right Ear: Tympanic membrane and external ear normal.     Left Ear: Tympanic membrane and external ear normal.     Nose: Nose normal.     Mouth/Throat:     Mouth: Mucous membranes are moist.  Eyes:     General:        Right eye: No discharge.        Left eye: No discharge.     Conjunctiva/sclera: Conjunctivae normal.     Pupils: Pupils are equal, round, and reactive to light.  Neck:     Thyroid: No thyromegaly.     Vascular: No JVD.  Cardiovascular:     Rate and Rhythm: Normal rate and regular rhythm.     Heart sounds: Normal heart sounds. No murmur heard.    No friction rub. No gallop.  Pulmonary:     Effort: Pulmonary effort is normal.     Breath sounds: Normal breath sounds. No wheezing, rhonchi or rales.  Abdominal:     General: Bowel sounds are normal.     Palpations: Abdomen is soft. There is no mass.     Tenderness: There is no abdominal tenderness. There is no guarding.  Musculoskeletal:        General: Normal range of motion.     Cervical back: Normal range of motion and neck supple.  Lymphadenopathy:     Cervical: No cervical adenopathy.  Skin:    General: Skin is warm and dry.  Neurological:     Mental Status: She is alert.     Deep Tendon Reflexes: Reflexes are normal and symmetric.     Wt Readings from Last 3 Encounters:  09/06/22 180 lb (81.6 kg)  06/13/22 179 lb (81.2 kg)  03/16/22 181 lb (82.1 kg)    BP 120/68   Pulse 87   Ht '5\' 5"'$   (1.651 m)   Wt 180 lb (81.6 kg)   LMP 05/25/2020 (Approximate)   SpO2 98%   BMI 29.95 kg/m   Assessment and Plan:  1. Elevated blood pressure reading without diagnosis of hypertension Chronic.  Controlled.  Stable.  Continue hydrochlorothiazide 12.5 mg once a day.  Will check CMP for electrolytes and GFR. - hydrochlorothiazide (MICROZIDE) 12.5 MG capsule; Take 1 capsule (12.5 mg total) by mouth daily.  Dispense: 90 capsule; Refill: 1 - Comprehensive Metabolic Panel (CMET)  2. Familial hypercholesterolemia Chronic.  Controlled.  Stable.  Will check lipid panel for current level of colon control and will provide dietary guidelines - Lipid Panel With LDL/HDL Ratio  3. Generalized edema Chronic.  Controlled.  Stable.  Continue with hydrochlorothiazide 12.5 mg once a day. - hydrochlorothiazide (MICROZIDE) 12.5 MG capsule; Take 1 capsule (12.5 mg total) by mouth daily.  Dispense: 90 capsule; Refill: 1  4. Gastroesophageal reflux disease without esophagitis Chronic.  Controlled.  Stable.  Continue omeprazole 40 mg once a day. - omeprazole (PRILOSEC) 40 MG capsule; Take 1 capsule (40 mg total) by mouth daily.  Dispense: 90 capsule; Refill: 1  5. Anxiety .  Controlled.  Stable.  GAD score is 0.  Continue sertraline 50 mg once a day. - sertraline (ZOLOFT) 50 MG tablet; Take 1 tablet (50 mg total) by mouth daily.  Dispense: 90 tablet; Refill: 1  6. Reactive depression Chronic.  Controlled.  Stable.  PHQ is 0.  Will continue sertraline 50 mg once a day. - sertraline (ZOLOFT) 50 MG tablet; Take 1 tablet (50 mg total) by mouth daily.  Dispense: 90 tablet; Refill: 1  7. Primary insomnia .  Controlled.  Stable.  Symptomatic relief and tolerating trazodone 50 mg 1 nightly. - traZODone (DESYREL) 50 MG tablet; Take 1 tablet (50 mg total) by mouth at bedtime.  Dispense: 90 tablet; Refill: 1    Otilio Miu, MD

## 2022-09-07 LAB — COMPREHENSIVE METABOLIC PANEL
ALT: 25 IU/L (ref 0–32)
AST: 34 IU/L (ref 0–40)
Albumin/Globulin Ratio: 1.6 (ref 1.2–2.2)
Albumin: 4.4 g/dL (ref 3.9–4.9)
Alkaline Phosphatase: 67 IU/L (ref 44–121)
BUN/Creatinine Ratio: 10 (ref 9–23)
BUN: 8 mg/dL (ref 6–24)
Bilirubin Total: 0.4 mg/dL (ref 0.0–1.2)
CO2: 22 mmol/L (ref 20–29)
Calcium: 8.9 mg/dL (ref 8.7–10.2)
Chloride: 98 mmol/L (ref 96–106)
Creatinine, Ser: 0.81 mg/dL (ref 0.57–1.00)
Globulin, Total: 2.7 g/dL (ref 1.5–4.5)
Glucose: 89 mg/dL (ref 70–99)
Potassium: 3.6 mmol/L (ref 3.5–5.2)
Sodium: 138 mmol/L (ref 134–144)
Total Protein: 7.1 g/dL (ref 6.0–8.5)
eGFR: 88 mL/min/{1.73_m2} (ref >59)

## 2022-09-07 LAB — LIPID PANEL WITH LDL/HDL RATIO
Cholesterol, Total: 182 mg/dL (ref 100–199)
HDL: 57 mg/dL (ref >39)
LDL Chol Calc (NIH): 100 mg/dL — ABNORMAL HIGH (ref 0–99)
LDL/HDL Ratio: 1.8 ratio (ref 0.0–3.2)
Triglycerides: 142 mg/dL (ref 0–149)
VLDL Cholesterol Cal: 25 mg/dL (ref 5–40)

## 2022-09-08 ENCOUNTER — Encounter: Payer: Self-pay | Admitting: Oncology

## 2022-09-15 ENCOUNTER — Ambulatory Visit: Payer: Managed Care, Other (non HMO) | Admitting: Family Medicine

## 2022-09-16 ENCOUNTER — Inpatient Hospital Stay: Payer: Managed Care, Other (non HMO) | Attending: Oncology | Admitting: Oncology

## 2022-09-16 ENCOUNTER — Encounter: Payer: Self-pay | Admitting: Oncology

## 2022-09-16 VITALS — BP 108/83 | HR 87 | Temp 98.9°F | Resp 18 | Wt 175.0 lb

## 2022-09-16 DIAGNOSIS — Z79810 Long term (current) use of selective estrogen receptor modulators (SERMs): Secondary | ICD-10-CM | POA: Diagnosis not present

## 2022-09-16 DIAGNOSIS — C50912 Malignant neoplasm of unspecified site of left female breast: Secondary | ICD-10-CM | POA: Diagnosis present

## 2022-09-16 DIAGNOSIS — Z87891 Personal history of nicotine dependence: Secondary | ICD-10-CM | POA: Diagnosis not present

## 2022-09-16 DIAGNOSIS — Z08 Encounter for follow-up examination after completed treatment for malignant neoplasm: Secondary | ICD-10-CM

## 2022-09-16 DIAGNOSIS — Z170 Estrogen receptor positive status [ER+]: Secondary | ICD-10-CM | POA: Insufficient documentation

## 2022-09-16 DIAGNOSIS — Z5181 Encounter for therapeutic drug level monitoring: Secondary | ICD-10-CM | POA: Diagnosis not present

## 2022-09-16 DIAGNOSIS — Z853 Personal history of malignant neoplasm of breast: Secondary | ICD-10-CM | POA: Diagnosis not present

## 2022-09-16 NOTE — Progress Notes (Signed)
Hematology/Oncology Consult note [facility]  [phone] [phone]  Patient Care Team: Juline Patch, MD as PCP - General (Family Medicine)   Name of the patient: Tiffany Humphrey  [id number]  02/29/1971   Date of visit: 09/16/22  Diagnosis- stage I ER positive left breast cancer in 2019 currently on tamoxifen   Chief complaint/ Reason for visit-routine follow-up of breast cancer on tamoxifen  Heme/Onc history: patient is a 52- year-old female whose mother was recently diagnosed with DCIS.  She recently underwent a screening mammogram which showed abnormal calcifications in her left breast.  Diagnostic mammogram and ultrasound confirmed 9 x 5 x 5 mm calcifications.  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  There was a single focus worrisome but not diagnostic for microinvasion.  Patient has met with surgery and is tentatively scheduled to undergo lumpectomy and sentinel lymph node biopsy next week.  Patient states that she has used birth control for the last 25 years.  She used to have regular.  But about 5 months ago she did not have her menstrual cycles for about 5 months.  At that point her birth control was temporarily held and she did have her menstrual cycle after 5 months.  She did restart her birth control at that point.  She feels well today and denies any complaints.  Her appetite is good and she denies any unintentional weight loss. She is G1P1L1.  No other family history of breast cancer other than her mother had DCIS.   Patient had a biopsy on 06/22/2018.  Final pathology showed invasive mammary carcinoma 5 mm grade 2 ER greater than 90% positive PR 51 to 90% positive and HER-2/neu negative with negative margins.  Sentinel lymph node was negative for malignancy.  This was associated with high-grade DCIS.   oncotype score came back at 16. Plan is to proceed with OS plus AI.  However patient had significant headache with  the first dose of Lupron to the point that she could not function and headache persisted for almost a month.  Ovarian suppression was discontinued and patient was switched to tamoxifen since February 2020.  Adjuvant radiation treatment completed in January 2020.    Interval history-tolerating tamoxifen well without any significant side effects.  She still has occasional sharp shooting pains in her left breast where she had lumpectomy.  Denies other new aches and pains anywhere.  Appetite and weight have remained stable  ECOG PS- 0 Pain scale- 0   Review of systems- Review of Systems  Constitutional:  Negative for chills, fever, malaise/fatigue and weight loss.  HENT:  Negative for congestion, ear discharge and nosebleeds.   Eyes:  Negative for blurred vision.  Respiratory:  Negative for cough, hemoptysis, sputum production, shortness of breath and wheezing.   Cardiovascular:  Negative for chest pain, palpitations, orthopnea and claudication.  Gastrointestinal:  Negative for abdominal pain, blood in stool, constipation, diarrhea, heartburn, melena, nausea and vomiting.  Genitourinary:  Negative for dysuria, flank pain, frequency, hematuria and urgency.  Musculoskeletal:  Negative for back pain, joint pain and myalgias.  Skin:  Negative for rash.  Neurological:  Negative for dizziness, tingling, focal weakness, seizures, weakness and headaches.  Endo/Heme/Allergies:  Does not bruise/bleed easily.  Psychiatric/Behavioral:  Negative for depression and suicidal ideas. The patient does not have insomnia.       Allergies  Allergen Reactions   Quinolones Hives   Clindamycin Hives   Codeine Other (See Comments)  and Palpitations    tachycardia     Past Medical History:  Diagnosis Date   Anxiety    Breast cancer ([place]) 06/2018   left breast   Cancer ([place])    LEFT BREAST    Complication of anesthesia    panic attacks   GERD (gastroesophageal reflux disease)    Headache     migraines/stress, optical migraines   History of kidney stones    Hypertension    Multilevel degenerative disc disease    Personal history of radiation therapy    PONV (postoperative nausea and vomiting)    Vertigo    last episode 10/2016-NO PROBLEMS SINCE SINUS SURGERY IN 2018   Wears contact lenses      Past Surgical History:  Procedure Laterality Date   BREAST BIOPSY Right 06/08/2016   CYSTIC PAPILLARY APOCRINE METAPLASIA.    BREAST BIOPSY Left 06/06/2018   left breast stereo X clip positive   BREAST EXCISIONAL BIOPSY Left 06/22/2018   lumpectomy with sn and nl   BREAST EXCISIONAL BIOPSY Left 05/31/2021   removal of fat necrosis due to pain   BREAST LUMPECTOMY Left 06/22/2018   CYSTOSTOMY Right 05/10/2019   Procedure: RIGHT OVARIAN CYSTOSTOMY;  Surgeon: Ouida Sills Gwen Her, MD;  Location: [facility];  Service: Gynecology;  Laterality: Right;   FRONTAL SINUS EXPLORATION Bilateral 12/22/2016   Procedure: FRONTAL SINUS EXPLORATION;  Surgeon: Margaretha Sheffield, MD;  Location: [place];  Service: ENT;  Laterality: Bilateral;   IMAGE GUIDED SINUS SURGERY N/A 12/22/2016   Procedure: IMAGE GUIDED SINUS SURGERY;  Surgeon: Margaretha Sheffield, MD;  Location: [place];  Service: ENT;  Laterality: N/A;   LAPAROSCOPIC TUBAL LIGATION Bilateral 05/10/2019   Procedure: LAPAROSCOPIC TUBAL LIGATION, falope rings Right fallope ring  Left tubal cautery;  Surgeon: Boykin Nearing, MD;  Location: [facility];  Service: Gynecology;  Laterality: Bilateral;   MASS EXCISION Left 05/31/2021   Procedure: EXCISION MASS (scar);  Surgeon: Herbert Pun, MD;  Location: [facility];  Service: General;  Laterality: Left;   MAXILLARY ANTROSTOMY Bilateral 12/22/2016   Procedure: MAXILLARY ANTROSTOMY;  Surgeon: Margaretha Sheffield, MD;  Location: [place];  Service: ENT;  Laterality: Bilateral;   PARTIAL MASTECTOMY WITH NEEDLE LOCALIZATION Left 06/22/2018   Procedure: PARTIAL MASTECTOMY  WITH NEEDLE LOCALIZATION;  Surgeon: Herbert Pun, MD;  Location: [facility];  Service: General;  Laterality: Left;   SENTINEL NODE BIOPSY Left 06/22/2018   Procedure: SENTINEL NODE BIOPSY;  Surgeon: Herbert Pun, MD;  Location: [facility];  Service: General;  Laterality: Left;   SEPTOPLASTY N/A 12/22/2016   Procedure: SEPTOPLASTY;  Surgeon: Margaretha Sheffield, MD;  Location: [place];  Service: ENT;  Laterality: N/A;  GAVE DISK TO CE[facility] 4-5 KP   TURBINATE REDUCTION N/A 12/22/2016   Procedure: TURBINATE REDUCTION partial inferior;  Surgeon: Margaretha Sheffield, MD;  Location: [place];  Service: ENT;  Laterality: N/A;    Social History   Socioeconomic History   Marital status: Married    Spouse name: Not on file   Number of children: Not on file   Years of education: Not on file   Highest education level: Not on file  Occupational History   Not on file  Tobacco Use   Smoking status: Former    Packs/day: 1.00    Years: 15.00    Total pack years: 15.00    Types: Cigarettes    Quit date: 2013    Years since quitting: 11.0   Smokeless tobacco: Never  Vaping Use   Vaping Use: Former  Substance and Sexual Activity   Alcohol use: Yes    Alcohol/week: 2.0 standard drinks of alcohol    Types: 1 Glasses of wine, 1 Cans of beer per week    Comment: social (4-6 drinks/month)   Drug use: No   Sexual activity: Yes  Other Topics Concern   Not on file  Social History Narrative   Lives at home with hubby   Social Determinants of Health   Financial Resource Strain: Not on file  Food Insecurity: Not on file  Transportation Needs: Not on file  Physical Activity: Not on file  Stress: Not on file  Social Connections: Not on file  Intimate Partner Violence: Not on file    Family History  Problem Relation Age of Onset   Breast cancer Mother        dx 36s; 2nd primary at 39   Breast cancer Paternal Grandmother        dx 20s; deceased 69s   Breast cancer Paternal  Aunt        dx 12s; currently 72s   Breast cancer Cousin        dx 67s; currently late 77s; daughter of a paternal uncle   Lung cancer Paternal Uncle        smoker; deceased     Current Outpatient Medications:    calcium-vitamin D (OSCAL WITH D) 500-200 MG-UNIT tablet, Take 1 tablet by mouth., Disp: , Rfl:    Cyanocobalamin (B-12) 2500 MCG SUBL, Place 2,500 mcg under the tongue daily., Disp: , Rfl:    estradiol (ESTRACE) 0.1 MG/GM vaginal cream, Use 1/4 app per vagina 1-2 times weekly, Disp: , Rfl:    etodolac (LODINE) 500 MG tablet, Take 500 mg by mouth 2 (two) times daily., Disp: , Rfl:    Fezolinetant (VEOZAH) 45 MG TABS, Take by mouth., Disp: , Rfl:    hydrochlorothiazide (MICROZIDE) 12.5 MG capsule, Take 1 capsule (12.5 mg total) by mouth daily., Disp: 90 capsule, Rfl: 1   ibuprofen (ADVIL) 800 MG tablet, Take 800 mg by mouth every 8 (eight) hours as needed for headache., Disp: , Rfl:    levocetirizine (XYZAL) 5 MG tablet, Take 5 mg by mouth daily. , Disp: , Rfl:    meclizine (ANTIVERT) 25 MG tablet, Take 25 mg by mouth 3 (three) times daily as needed for dizziness., Disp: , Rfl:    naproxen sodium (ALEVE) 220 MG tablet, Take 440 mg by mouth 2 (two) times daily as needed (pain.)., Disp: , Rfl:    NON FORMULARY, Place 3 drops under the tongue at bedtime. Allergy Oral Drops made by [facility], Disp: , Rfl:    omeprazole (PRILOSEC) 40 MG capsule, Take 1 capsule (40 mg total) by mouth daily., Disp: 90 capsule, Rfl: 1   sertraline (ZOLOFT) 50 MG tablet, Take 1 tablet (50 mg total) by mouth daily., Disp: 90 tablet, Rfl: 1   tamoxifen (NOLVADEX) 20 MG tablet, TAKE 1 TABLET BY MOUTH EVERY DAY, Disp: 90 tablet, Rfl: 1   traMADol (ULTRAM) 50 MG tablet, Take 50 mg by mouth every 6 (six) hours as needed (pain)., Disp: , Rfl:    traZODone (DESYREL) 50 MG tablet, Take 1 tablet (50 mg total) by mouth at bedtime., Disp: 90 tablet, Rfl: 1   triamcinolone (NASACORT) 55 MCG/ACT AERO nasal inhaler,  Place 1 spray into the nose daily., Disp: , Rfl:    ALPRAZolam (XANAX) 0.25 MG tablet, Take 1 tablet (0.25 mg  total) by mouth daily as needed for anxiety. (Patient not taking: Reported on 09/06/2022), Disp: 30 tablet, Rfl: 0  Physical exam:  Vitals:   09/16/22 1114  BP: 108/83  Pulse: 87  Resp: 18  Temp: 98.9 F (37.2 C)  SpO2: 98%  Weight: 175 lb (79.4 kg)   Physical Exam Cardiovascular:     Rate and Rhythm: Normal rate and regular rhythm.     Heart sounds: Normal heart sounds.  Pulmonary:     Effort: Pulmonary effort is normal.     Breath sounds: Normal breath sounds.  Abdominal:     General: Bowel sounds are normal.     Palpations: Abdomen is soft.  Skin:    General: Skin is warm and dry.  Neurological:     Mental Status: She is alert and oriented to person, place, and time.    Breast exam was performed in seated and lying down position. Patient is status post left lumpectomy with a well-healed surgical scar. No evidence of any palpable masses. No evidence of axillary adenopathy. No evidence of any palpable masses or lumps in the right breast. No evidence of right axillary adenopathy      Latest Ref Rng & Units 09/06/2022   11:41 AM  CMP  Glucose 70 - 99 mg/dL 89   BUN 6 - 24 mg/dL 8   Creatinine 0.57 - 1.00 mg/dL 0.81   Sodium 134 - 144 mmol/L 138   Potassium 3.5 - 5.2 mmol/L 3.6   Chloride 96 - 106 mmol/L 98   CO2 20 - 29 mmol/L 22   Calcium 8.7 - 10.2 mg/dL 8.9   Total Protein 6.0 - 8.5 g/dL 7.1   Total Bilirubin 0.0 - 1.2 mg/dL 0.4   Alkaline Phos 44 - 121 IU/L 67   AST 0 - 40 IU/L 34   ALT 0 - 32 IU/L 25       Latest Ref Rng & Units 05/31/2021   10:38 AM  CBC  Hemoglobin 12.0 - 15.0 g/dL 15.3   Hematocrit 36.0 - 46.0 % 45.0     Assessment and plan- Patient is a 52 y.o. female with stage I invasive mammary carcinoma of the left breast ER/PR positive HER2 negative s/p surgery, adjuvantRadiation.  She is here for routine follow-up of breast cancer on  tamoxifen  Clinically patient is doing well with no concerning signs and symptoms of recurrence based on today's exam.  This is year 5 of surveillance and she will be completing 5 years of endocrine therapy in February 2025.  I will see her back in 1 year and we will have a discussion at that time if she wants to stop at 5 years versus continue it for 10 years given that she was premenopausal at diagnosis.  I will be scheduling her mammogram in July 2024.  Currently she is postmenopausal.  Baseline bone density did show osteopenia but was not bad enough to warrant adjuvant bisphosphonates.  She will continue calcium and vitamin D   Visit Diagnosis 1. Encounter for follow-up surveillance of breast cancer   2. Encounter for monitoring tamoxifen therapy      Dr. Randa Evens, MD, MPH [facility] [phone] 09/16/2022 4:27 PM

## 2022-09-16 NOTE — Progress Notes (Signed)
Patient here for oncology follow-up appointment, expresses no complaints or concerns at this time.    

## 2022-10-10 ENCOUNTER — Encounter: Payer: Self-pay | Admitting: Family Medicine

## 2022-11-09 ENCOUNTER — Telehealth: Payer: Managed Care, Other (non HMO) | Admitting: Physician Assistant

## 2022-11-09 ENCOUNTER — Other Ambulatory Visit: Payer: Self-pay | Admitting: Family Medicine

## 2022-11-09 DIAGNOSIS — J019 Acute sinusitis, unspecified: Secondary | ICD-10-CM | POA: Diagnosis not present

## 2022-11-09 DIAGNOSIS — B9689 Other specified bacterial agents as the cause of diseases classified elsewhere: Secondary | ICD-10-CM | POA: Diagnosis not present

## 2022-11-09 MED ORDER — AMOXICILLIN-POT CLAVULANATE 875-125 MG PO TABS: 1.0000 | ORAL_TABLET | Freq: Two times a day (BID) | ORAL | 0 refills | Status: DC

## 2022-11-09 NOTE — Telephone Encounter (Signed)
Requested medication (s) are due for refill today: yes  Requested medication (s) are on the active medication list: yes  Last refill:  05/16/22  Future visit scheduled: yes  Notes to clinic:  Unable to refill per protocol, last refill by another provider.      Requested Prescriptions  Pending Prescriptions Disp Refills   tamoxifen (NOLVADEX) 20 MG tablet [Pharmacy Med Name: TAMOXIFEN 20 MG TABLET] 90 tablet 1    Sig: TAKE 1 TABLET BY MOUTH EVERY DAY     There is no refill protocol information for this order

## 2022-11-09 NOTE — Progress Notes (Signed)

## 2022-12-20 ENCOUNTER — Other Ambulatory Visit: Payer: Self-pay | Admitting: Oncology

## 2023-01-05 ENCOUNTER — Other Ambulatory Visit: Payer: Self-pay | Admitting: Orthopedic Surgery

## 2023-01-11 ENCOUNTER — Encounter
Admission: RE | Admit: 2023-01-11 | Discharge: 2023-01-11 | Disposition: A | Discharge status: Home / Self Care | Payer: Managed Care, Other (non HMO) | Source: Ambulatory Visit | Attending: Orthopedic Surgery | Admitting: Orthopedic Surgery

## 2023-01-11 VITALS — Ht 65.0 in | Wt 170.0 lb

## 2023-01-11 DIAGNOSIS — I10 Essential (primary) hypertension: Secondary | ICD-10-CM

## 2023-01-11 DIAGNOSIS — Z01812 Encounter for preprocedural laboratory examination: Secondary | ICD-10-CM

## 2023-01-11 HISTORY — DX: Deficiency of other specified B group vitamins: E53.8

## 2023-01-11 NOTE — Patient Instructions (Addendum)
Your procedure is scheduled on: Friday, May 17 Report to the Registration Desk on the 1st floor of the [facility] To find out your arrival time, please call [phone] between 1PM - 3PM on: Thursday, May 16 If your arrival time is 6:00 am, do not arrive before that time as the [facility] entrance doors do not open until 6:00 am.  REMEMBER: Instructions that are not followed completely may result in serious medical risk, up to and including death; or upon the discretion of your surgeon and anesthesiologist your surgery may need to be rescheduled.  Do not eat food after midnight the night before surgery.  No gum chewing or hard candies.  You may however, drink CLEAR liquids up to 2 hours before you are scheduled to arrive for your surgery. Do not drink anything within 2 hours of your scheduled arrival time.  Clear liquids include: - water  - apple juice without pulp - gatorade (not RED colors) - black coffee or tea (Do NOT add milk or creamers to the coffee or tea) Do NOT drink anything that is not on this list.  In addition, your doctor has ordered for you to drink the provided:  Ensure Pre-Surgery Clear Carbohydrate Drink  Drinking this carbohydrate drink up to two hours before surgery helps to reduce insulin resistance and improve patient outcomes. Please complete drinking 2 hours before scheduled arrival time.  One week prior to surgery: starting today, May 15 Stop etodolac and Anti-inflammatories (NSAIDS) such as Advil, Aleve, Ibuprofen, Motrin, Naproxen, Naprosyn and Aspirin based products such as Excedrin, Goody's Powder, BC Powder. Stop ANY OVER THE COUNTER supplements until after surgery. Stop calcium, multiple vitamins. You may however, continue to take Tylenol if needed for pain up until the day of surgery.  Continue taking all prescribed medications   TAKE ONLY THESE MEDICATIONS THE MORNING OF SURGERY WITH A SIP OF WATER:  Omeprazole (Prilosec) - (take one the  night before and one on the morning of surgery - helps to prevent nausea after surgery.) Sertraline (Zoloft) Tamoxifen Alprazolam (Xanax) if needed for anxiety Tramadol if needed for pain  No Alcohol for 24 hours before or after surgery.  No Smoking including e-cigarettes for 24 hours before surgery.  No chewable tobacco products for at least 6 hours before surgery.  No nicotine patches on the day of surgery.  Do not use any "recreational" drugs for at least a week (preferably 2 weeks) before your surgery.  Please be advised that the combination of cocaine and anesthesia may have negative outcomes, up to and including death. If you test positive for cocaine, your surgery will be cancelled.  On the morning of surgery brush your teeth with toothpaste and water, you may rinse your mouth with mouthwash if you wish. Do not swallow any toothpaste or mouthwash.  Use CHG Soap as directed on instruction sheet.  Do not wear jewelry, make-up, hairpins, clips or nail polish.  Do not wear lotions, powders, or perfumes.   Do not shave body hair from the neck down 48 hours before surgery.  Contact lenses, hearing aids and dentures may not be worn into surgery.  Do not bring valuables to the hospital. [facility] is not responsible for any missing/lost belongings or valuables.   Notify your doctor if there is any change in your medical condition (cold, fever, infection).  Wear comfortable clothing (specific to your surgery type) to the hospital.  After surgery, you can help prevent lung complications by doing breathing exercises.  Take deep breaths and cough every 1-2 hours. Your doctor may order a device called an Incentive Spirometer to help you take deep breaths.  If you are being discharged the day of surgery, you will not be allowed to drive home. You will need a responsible individual to drive you home and stay with you for 24 hours after surgery.   If you are taking public  transportation, you will need to have a responsible individual with you.  Please call the Pre-admissions Testing Dept. at [phone] if you have any questions about these instructions.  Surgery Visitation Policy:  Patients having surgery or a procedure may have two visitors.  Children under the age of 38 must have an adult with them who is not the patient.     Preparing for Surgery with CHLORHEXIDINE GLUCONATE (CHG) Soap  Chlorhexidine Gluconate (CHG) Soap  o An antiseptic cleaner that kills germs and bonds with the skin to continue killing germs even after washing  o Used for showering the night before surgery and morning of surgery  Before surgery, you can play an important role by reducing the number of germs on your skin.  CHG (Chlorhexidine gluconate) soap is an antiseptic cleanser which kills germs and bonds with the skin to continue killing germs even after washing.  Please do not use if you have an allergy to CHG or antibacterial soaps. If your skin becomes reddened/irritated stop using the CHG.  1. Shower the NIGHT BEFORE SURGERY and the MORNING OF SURGERY with CHG soap.  2. If you choose to wash your hair, wash your hair first as usual with your normal shampoo.  3. After shampooing, rinse your hair and body thoroughly to remove the shampoo.  4. Use CHG as you would any other liquid soap. You can apply CHG directly to the skin and wash gently with a scrungie or a clean washcloth.  5. Apply the CHG soap to your body only from the neck down. Do not use on open wounds or open sores. Avoid contact with your eyes, ears, mouth, and genitals (private parts). Wash face and genitals (private parts) with your normal soap.  6. Wash thoroughly, paying special attention to the area where your surgery will be performed.  7. Thoroughly rinse your body with warm water.  8. Do not shower/wash with your normal soap after using and rinsing off the CHG soap.  9. Pat yourself dry  with a clean towel.  10. Wear clean pajamas to bed the night before surgery.  12. Place clean sheets on your bed the night of your first shower and do not sleep with pets.  13. Shower again with the CHG soap on the day of surgery prior to arriving at the hospital.  14. Do not apply any deodorants/lotions/powders.  15. Please wear clean clothes to the hospital.

## 2023-01-12 ENCOUNTER — Telehealth: Payer: Self-pay | Admitting: Urgent Care

## 2023-01-12 ENCOUNTER — Encounter
Admission: RE | Admit: 2023-01-12 | Discharge: 2023-01-12 | Disposition: A | Discharge status: Home / Self Care | Payer: Managed Care, Other (non HMO) | Source: Ambulatory Visit | Attending: Orthopedic Surgery | Admitting: Orthopedic Surgery

## 2023-01-12 DIAGNOSIS — E876 Hypokalemia: Secondary | ICD-10-CM | POA: Diagnosis not present

## 2023-01-12 DIAGNOSIS — Z79899 Other long term (current) drug therapy: Secondary | ICD-10-CM | POA: Diagnosis not present

## 2023-01-12 DIAGNOSIS — I10 Essential (primary) hypertension: Secondary | ICD-10-CM

## 2023-01-12 DIAGNOSIS — Z01818 Encounter for other preprocedural examination: Secondary | ICD-10-CM | POA: Insufficient documentation

## 2023-01-12 DIAGNOSIS — Z01812 Encounter for preprocedural laboratory examination: Secondary | ICD-10-CM

## 2023-01-12 DIAGNOSIS — R9431 Abnormal electrocardiogram [ECG] [EKG]: Secondary | ICD-10-CM | POA: Insufficient documentation

## 2023-01-12 LAB — BASIC METABOLIC PANEL
Anion gap: 14 (ref 5–15)
BUN: 9 mg/dL (ref 6–20)
CO2: 24 mmol/L (ref 22–32)
Calcium: 8.4 mg/dL — ABNORMAL LOW (ref 8.9–10.3)
Chloride: 98 mmol/L (ref 98–111)
Creatinine, Ser: 0.8 mg/dL (ref 0.44–1.00)
GFR, Estimated: >60 mL/min (ref >60)
Glucose, Bld: 83 mg/dL (ref 70–99)
Potassium: 2.5 mmol/L — CL (ref 3.5–5.1)
Sodium: 136 mmol/L (ref 135–145)

## 2023-01-12 LAB — CBC
HCT: 43.6 % (ref 36.0–46.0)
Hemoglobin: 15 g/dL (ref 12.0–15.0)
MCH: 31.1 pg (ref 26.0–34.0)
MCHC: 34.4 g/dL (ref 30.0–36.0)
MCV: 90.5 fL (ref 80.0–100.0)
Platelets: 233 10*3/uL (ref 150–400)
RBC: 4.82 MIL/uL (ref 3.87–5.11)
RDW: 13.3 % (ref 11.5–15.5)
WBC: 7.4 10*3/uL (ref 4.0–10.5)
nRBC: 0 % (ref 0.0–0.2)

## 2023-01-12 MED ORDER — POTASSIUM CHLORIDE CRYS ER 20 MEQ PO TBCR
EXTENDED_RELEASE_TABLET | ORAL | 0 refills | Status: DC
Start: 2023-01-12 — End: 2023-01-13

## 2023-01-12 NOTE — Progress Notes (Signed)
[facility] Perioperative Services: Pre-Admission/Anesthesia Testing  Abnormal Lab Notification and Treatment Plan of Care   Date: 01/12/23  Name: Tiffany Humphrey MRN:   [id number]  Re: Abnormal labs noted during PAT appointment   Notified:  Provider Name Provider Role Notification Mode  Signa Kell, MD Orthopedics (Surgeon) Routed and/or faxed via Verl Bangs, MD Primary Care Provider Routed and/or faxed via [facility]   Clinical Information and Notes:  ABNORMAL LAB VALUE(S): Lab Results  Component Value Date   K 2.5 (LL) 01/12/2023   Tiffany Humphrey is scheduled for an elective RIGHT KNEE DIAGNOSTIC ARTHROSCOPY, PARTIAL SYNOVECTOMY, AND/OR CHONDROPLASTY  on 01/13/2023. In review of her medication reconciliation, it is noted that the patient is taking prescribed diuretic medications (HCTZ 12.5 mg) daily.   Please note, in efforts to promote a safe and effective anesthetic course, per current guidelines/standards set by the [facility] anesthesia team, the minimal acceptable K+ level for the patient to proceed with general anesthesia is 3.0 mmol/L. She is already too low for surgery. Given the limited amount of time before her procedure (tomorrow), electing to aggressively replace K+ with oral supplements in efforts to optimize her levels and ensure that she is safely able to proceed with her surgery as planned.   Impression and Plan:  Tiffany Humphrey found to be HYPOkalemic at 2.5 mmol/L on preoperative labs. She is on thiazide diuretic therapy. ECG showed NSR at a rate of 74 bpm. QRS amplitude reduced, TWI in lead V2, and T-wave flattening noted in leads III and V3. While these findings can be seen with hypokalemia, her previous tracing from 05/07/2019 demonstrated similar findings. Patient feeling well overall; no chest pain, SOB, palpitations, AMS, profound weakness, muscle cramping, or significant fatigue. Patient does not have any significant cardiovascular or  renovascular diagnoses. Renal function normal with a BUN of 9 and a creatinine of 0.80 mg/dL.   Patient is not on any type of oral K+ supplementation at this time. Called patient to discuss results and plans for correction of noted electrolyte derangement as follows:  Meds ordered this encounter  Medications   potassium chloride SA (KLOR-CON M) 20 MEQ tablet    Sig: Take 3 tablets (60 mEq) now, 2 tablets (40 mEq) before bed tonight, and 2 tablets (40 mEq) in the morning. Follow up with PCP for repeat labs.    Dispense:  7 tablet    Refill:  0    Please contact patient once Rx is filled and ready. This is for preoperative optimization and needs to be started ASAP.   Encouraged patient to follow up with PCP about 2-3 weeks postoperatively to have labs rechecked to ensure that levels are remaining within normal range. Discussed nutritional intake of K+ rich foods as an adjunctive way to keep her K+ levels normal; list of K+ rich foods verbally provided. Also mentioned ORS, however advised her not to rely solely on these drinks, as they are high in Na+, and she has a HTN diagnosis.   Will send copy of this note to surgeon and PCP to make them aware of K+ level and plans for correction. Discussed that PCP may elect to pursue a change in diuretic therapy to a K+ sparing type medication, or alternatively, they may consider adding a daily K+ supplement if levels remain low on recheck. Order entered to recheck K+ on the day of her surgery to ensure optimization. Wished patient the best of luck with her upcoming surgery and  subsequent recovery. She was encouraged to return call to the PAT clinic, or to her surgeon's office, should any questions or concerns arise between now and the time of her surgery. Patient was appreciative of the care/concern expressed by PAT staff.   Encounter Diagnoses  Name Primary?   Pre-operative laboratory examination Yes   Diuretic-induced hypokalemia    Long term current use of  diuretic    Tiffany Mulling, MSN, APRN, FNP-C, CEN [facility]  Peri-operative Services Nurse Practitioner Phone: [phone] 01/12/23 11:29 AM  NOTE: This note has been prepared using Dragon dictation software. Despite my best ability to proofread, there is always the potential that unintentional transcriptional errors may still occur from this process.

## 2023-01-13 ENCOUNTER — Encounter
Admission: RE | Disposition: A | Discharge status: Home / Self Care | Payer: Self-pay | Source: Home / Self Care | Attending: Orthopedic Surgery

## 2023-01-13 ENCOUNTER — Ambulatory Visit: Payer: Managed Care, Other (non HMO) | Admitting: Urgent Care

## 2023-01-13 ENCOUNTER — Ambulatory Visit
Admission: RE | Admit: 2023-01-13 | Discharge: 2023-01-13 | Disposition: A | Discharge status: Home / Self Care | Payer: Managed Care, Other (non HMO) | Attending: Orthopedic Surgery | Admitting: Orthopedic Surgery

## 2023-01-13 ENCOUNTER — Encounter: Payer: Self-pay | Admitting: Orthopedic Surgery

## 2023-01-13 ENCOUNTER — Other Ambulatory Visit: Payer: Self-pay

## 2023-01-13 DIAGNOSIS — S83241A Other tear of medial meniscus, current injury, right knee, initial encounter: Secondary | ICD-10-CM | POA: Diagnosis not present

## 2023-01-13 DIAGNOSIS — X58XXXA Exposure to other specified factors, initial encounter: Secondary | ICD-10-CM | POA: Insufficient documentation

## 2023-01-13 DIAGNOSIS — E876 Hypokalemia: Secondary | ICD-10-CM

## 2023-01-13 DIAGNOSIS — Z79899 Other long term (current) drug therapy: Secondary | ICD-10-CM

## 2023-01-13 DIAGNOSIS — M1711 Unilateral primary osteoarthritis, right knee: Secondary | ICD-10-CM | POA: Diagnosis not present

## 2023-01-13 DIAGNOSIS — Z01812 Encounter for preprocedural laboratory examination: Secondary | ICD-10-CM

## 2023-01-13 HISTORY — DX: Sleep disorder, unspecified: G47.9

## 2023-01-13 HISTORY — PX: KNEE ARTHROSCOPY: SHX127

## 2023-01-13 LAB — POCT I-STAT, CHEM 8
BUN: 6 mg/dL (ref 6–20)
BUN: 6 mg/dL (ref 6–20)
BUN: 6 mg/dL (ref 6–20)
Calcium, Ion: 0.86 mmol/L — CL (ref 1.15–1.40)
Calcium, Ion: 0.89 mmol/L — CL (ref 1.15–1.40)
Calcium, Ion: 0.89 mmol/L — CL (ref 1.15–1.40)
Chloride: 108 mmol/L (ref 98–111)
Chloride: 108 mmol/L (ref 98–111)
Chloride: 110 mmol/L (ref 98–111)
Creatinine, Ser: 0.7 mg/dL (ref 0.44–1.00)
Creatinine, Ser: 0.7 mg/dL (ref 0.44–1.00)
Creatinine, Ser: 0.7 mg/dL (ref 0.44–1.00)
Glucose, Bld: 75 mg/dL (ref 70–99)
Glucose, Bld: 76 mg/dL (ref 70–99)
Glucose, Bld: 80 mg/dL (ref 70–99)
HCT: 45 % (ref 36.0–46.0)
HCT: 46 % (ref 36.0–46.0)
HCT: 40 % (ref 36.0–46.0)
Hemoglobin: 15.3 g/dL — ABNORMAL HIGH (ref 12.0–15.0)
Hemoglobin: 15.6 g/dL — ABNORMAL HIGH (ref 12.0–15.0)
Hemoglobin: 13.6 g/dL (ref 12.0–15.0)
Potassium: 6.5 mmol/L (ref 3.5–5.1)
Potassium: 6.5 mmol/L (ref 3.5–5.1)
Potassium: 5.3 mmol/L — ABNORMAL HIGH (ref 3.5–5.1)
Sodium: 135 mmol/L (ref 135–145)
Sodium: 136 mmol/L (ref 135–145)
Sodium: 136 mmol/L (ref 135–145)
TCO2: 24 mmol/L (ref 22–32)
TCO2: 25 mmol/L (ref 22–32)
TCO2: 22 mmol/L (ref 22–32)

## 2023-01-13 SURGERY — ARTHROSCOPY, KNEE
Anesthesia: General | Site: Knee | Laterality: Right

## 2023-01-13 MED ORDER — ONDANSETRON HCL 4 MG/2ML IJ SOLN
INTRAMUSCULAR | Status: AC
  Filled 2023-01-13: qty 2

## 2023-01-13 MED ORDER — ASPIRIN 325 MG PO TBEC: 325.0000 mg | DELAYED_RELEASE_TABLET | Freq: Every day | ORAL | 0 refills | Status: AC

## 2023-01-13 MED ORDER — FENTANYL CITRATE (PF) 100 MCG/2ML IJ SOLN
INTRAMUSCULAR | Status: AC
  Filled 2023-01-13: qty 2

## 2023-01-13 MED ORDER — CHLORHEXIDINE GLUCONATE 0.12 % MT SOLN
15.0000 mL | Freq: Once | OROMUCOSAL | Status: AC
  Administered 2023-01-13: 15 mL via OROMUCOSAL

## 2023-01-13 MED ORDER — KETOROLAC TROMETHAMINE 30 MG/ML IJ SOLN
30.0000 mg | INTRAMUSCULAR | Status: AC
  Administered 2023-01-13: 30 mg via INTRAVENOUS

## 2023-01-13 MED ORDER — LIDOCAINE-EPINEPHRINE 1 %-1:100000 IJ SOLN
INTRAMUSCULAR | Status: DC | PRN
  Administered 2023-01-13: 3 mL via SURGICAL_CAVITY
  Administered 2023-01-13: 10 mL via SURGICAL_CAVITY

## 2023-01-13 MED ORDER — CEFAZOLIN SODIUM-DEXTROSE 2-4 GM/100ML-% IV SOLN
2.0000 g | INTRAVENOUS | Status: AC
  Administered 2023-01-13: 2 g via INTRAVENOUS

## 2023-01-13 MED ORDER — HYDROCODONE-ACETAMINOPHEN 5-325 MG PO TABS: 1.0000 | ORAL_TABLET | ORAL | 0 refills | Status: DC | PRN

## 2023-01-13 MED ORDER — LIDOCAINE HCL (CARDIAC) PF 100 MG/5ML IV SOSY
PREFILLED_SYRINGE | INTRAVENOUS | Status: DC | PRN
  Administered 2023-01-13: 80 mg via INTRAVENOUS

## 2023-01-13 MED ORDER — MIDAZOLAM HCL 2 MG/2ML IJ SOLN
INTRAMUSCULAR | Status: AC
  Filled 2023-01-13: qty 2

## 2023-01-13 MED ORDER — SODIUM CHLORIDE 0.9 % IR SOLN: Status: DC | PRN

## 2023-01-13 MED ORDER — LACTATED RINGERS IV SOLN: INTRAVENOUS | Status: DC

## 2023-01-13 MED ORDER — ORAL CARE MOUTH RINSE: 15.0000 mL | Freq: Once | OROMUCOSAL | Status: AC

## 2023-01-13 MED ORDER — DIPHENHYDRAMINE HCL 50 MG/ML IJ SOLN
INTRAMUSCULAR | Status: DC | PRN
  Administered 2023-01-13: 12.5 mg via INTRAVENOUS

## 2023-01-13 MED ORDER — BUPIVACAINE LIPOSOME 1.3 % IJ SUSP
INTRAMUSCULAR | Status: AC
  Filled 2023-01-13: qty 20

## 2023-01-13 MED ORDER — DEXAMETHASONE SODIUM PHOSPHATE 10 MG/ML IJ SOLN
INTRAMUSCULAR | Status: AC
  Filled 2023-01-13: qty 1

## 2023-01-13 MED ORDER — FENTANYL CITRATE (PF) 100 MCG/2ML IJ SOLN
INTRAMUSCULAR | Status: DC | PRN
  Administered 2023-01-13 (×2): 50 ug via INTRAVENOUS

## 2023-01-13 MED ORDER — LIDOCAINE HCL (PF) 2 % IJ SOLN
INTRAMUSCULAR | Status: AC
  Filled 2023-01-13: qty 5

## 2023-01-13 MED ORDER — CHLORHEXIDINE GLUCONATE 0.12 % MT SOLN
OROMUCOSAL | Status: AC
  Filled 2023-01-13: qty 15

## 2023-01-13 MED ORDER — PROPOFOL 10 MG/ML IV BOLUS
INTRAVENOUS | Status: DC | PRN
  Administered 2023-01-13: 150 mg via INTRAVENOUS

## 2023-01-13 MED ORDER — ONDANSETRON 4 MG PO TBDP: 4.0000 mg | ORAL_TABLET | Freq: Three times a day (TID) | ORAL | 0 refills | Status: DC | PRN

## 2023-01-13 MED ORDER — OXYCODONE HCL 5 MG PO TABS
5.0000 mg | ORAL_TABLET | Freq: Once | ORAL | Status: AC | PRN
  Administered 2023-01-13: 5 mg via ORAL

## 2023-01-13 MED ORDER — OXYCODONE HCL 5 MG PO TABS
ORAL_TABLET | ORAL | Status: AC
  Filled 2023-01-13: qty 1

## 2023-01-13 MED ORDER — FENTANYL CITRATE (PF) 100 MCG/2ML IJ SOLN
25.0000 ug | INTRAMUSCULAR | Status: DC | PRN
  Administered 2023-01-13 (×3): 25 ug via INTRAVENOUS

## 2023-01-13 MED ORDER — ONDANSETRON HCL 4 MG/2ML IJ SOLN
INTRAMUSCULAR | Status: DC | PRN
  Administered 2023-01-13: 4 mg via INTRAVENOUS

## 2023-01-13 MED ORDER — DEXAMETHASONE SODIUM PHOSPHATE 10 MG/ML IJ SOLN
INTRAMUSCULAR | Status: DC | PRN
  Administered 2023-01-13: 10 mg via INTRAVENOUS

## 2023-01-13 MED ORDER — KETOROLAC TROMETHAMINE 30 MG/ML IJ SOLN
INTRAMUSCULAR | Status: AC
  Filled 2023-01-13: qty 1

## 2023-01-13 MED ORDER — ACETAMINOPHEN 10 MG/ML IV SOLN
INTRAVENOUS | Status: DC | PRN
  Administered 2023-01-13: 1000 mg via INTRAVENOUS

## 2023-01-13 MED ORDER — DIPHENHYDRAMINE HCL 50 MG/ML IJ SOLN
INTRAMUSCULAR | Status: AC
  Filled 2023-01-13: qty 1

## 2023-01-13 MED ORDER — OXYCODONE HCL 5 MG/5ML PO SOLN: 5.0000 mg | Freq: Once | ORAL | Status: AC | PRN

## 2023-01-13 MED ORDER — PROPOFOL 1000 MG/100ML IV EMUL
INTRAVENOUS | Status: AC
  Filled 2023-01-13: qty 100

## 2023-01-13 MED ORDER — BUPIVACAINE HCL (PF) 0.5 % IJ SOLN
INTRAMUSCULAR | Status: AC
  Filled 2023-01-13: qty 30

## 2023-01-13 MED ORDER — EPINEPHRINE PF 1 MG/ML IJ SOLN
INTRAMUSCULAR | Status: AC
  Filled 2023-01-13: qty 4

## 2023-01-13 MED ORDER — ACETAMINOPHEN 500 MG PO TABS: 1000.0000 mg | ORAL_TABLET | Freq: Three times a day (TID) | ORAL | 2 refills | Status: DC

## 2023-01-13 MED ORDER — DEXMEDETOMIDINE HCL IN NACL 80 MCG/20ML IV SOLN
INTRAVENOUS | Status: DC | PRN
  Administered 2023-01-13: 12 ug via INTRAVENOUS
  Administered 2023-01-13: 8 ug via INTRAVENOUS

## 2023-01-13 MED ORDER — NEOMYCIN-POLYMYXIN B GU 40-200000 IR SOLN
Status: AC
  Filled 2023-01-13: qty 20

## 2023-01-13 MED ORDER — ACETAMINOPHEN 10 MG/ML IV SOLN
INTRAVENOUS | Status: AC
  Filled 2023-01-13: qty 100

## 2023-01-13 MED ORDER — PROPOFOL 10 MG/ML IV BOLUS
INTRAVENOUS | Status: AC
  Filled 2023-01-13: qty 20

## 2023-01-13 MED ORDER — MIDAZOLAM HCL 2 MG/2ML IJ SOLN
INTRAMUSCULAR | Status: DC | PRN
  Administered 2023-01-13: 2 mg via INTRAVENOUS

## 2023-01-13 MED ORDER — CEFAZOLIN SODIUM-DEXTROSE 2-4 GM/100ML-% IV SOLN
INTRAVENOUS | Status: AC
  Filled 2023-01-13: qty 100

## 2023-01-13 MED ORDER — IBUPROFEN 800 MG PO TABS: 800.0000 mg | ORAL_TABLET | Freq: Three times a day (TID) | ORAL | 0 refills | Status: AC

## 2023-01-13 SURGICAL SUPPLY — 55 items
ADAPTER IRRIG TUBE 2 SPIKE SOL (ADAPTER) ×2 IMPLANT
ADPR TBG 2 SPK PMP STRL ASCP (ADAPTER) ×2
APL PRP STRL LF DISP 70% ISPRP (MISCELLANEOUS) ×1
BLADE SAW SGTL 13X75X1.27 (BLADE) ×1 IMPLANT
BLADE SHAVER 4.5X7 STR FR (MISCELLANEOUS) IMPLANT
BLADE SURG 15 STRL LF DISP TIS (BLADE) ×1 IMPLANT
BLADE SURG 15 STRL SS (BLADE) ×1
BLADE SURG SZ11 CARB STEEL (BLADE) ×1 IMPLANT
BNDG ESMARCH 6 X 12 STRL LF (GAUZE/BANDAGES/DRESSINGS)
BNDG ESMARCH 6X12 STRL LF (GAUZE/BANDAGES/DRESSINGS) IMPLANT
BUR BR 5.5 WIDE MOUTH (BURR) IMPLANT
CHLORAPREP W/TINT 26 (MISCELLANEOUS) ×1 IMPLANT
COOLER POLAR GLACIER W/PUMP (MISCELLANEOUS) ×1 IMPLANT
CUFF TOURN SGL QUICK 24 (TOURNIQUET CUFF)
CUFF TOURN SGL QUICK 34 (TOURNIQUET CUFF)
CUFF TRNQT CYL 24X4X16.5-23 (TOURNIQUET CUFF) IMPLANT
CUFF TRNQT CYL 34X4.125X (TOURNIQUET CUFF) IMPLANT
DRAPE ARTHRO LIMB 89X125 STRL (DRAPES) ×1 IMPLANT
DRAPE C-ARM XRAY 36X54 (DRAPES) ×1 IMPLANT
DRAPE C-ARMOR (DRAPES) ×1 IMPLANT
DRAPE IMP U-DRAPE 54X76 (DRAPES) ×1 IMPLANT
GAUZE SPONGE 4X4 12PLY STRL (GAUZE/BANDAGES/DRESSINGS) ×1 IMPLANT
GLOVE BIOGEL PI IND STRL 8 (GLOVE) ×2 IMPLANT
GLOVE SURG ORTHO 8.0 STRL STRW (GLOVE) ×1 IMPLANT
GLOVE SURG SYN 7.5  E (GLOVE) ×1
GLOVE SURG SYN 7.5 E (GLOVE) ×1 IMPLANT
GLOVE SURG SYN 7.5 PF PI (GLOVE) ×1 IMPLANT
GOWN STRL REUS W/ TWL LRG LVL3 (GOWN DISPOSABLE) ×1 IMPLANT
GOWN STRL REUS W/TWL LRG LVL3 (GOWN DISPOSABLE) ×1
IV LACTATED RINGER IRRG 3000ML (IV SOLUTION) ×4
IV LR IRRIG 3000ML ARTHROMATIC (IV SOLUTION) ×4 IMPLANT
KIT TURNOVER KIT A (KITS) ×1 IMPLANT
MANIFOLD NEPTUNE II (INSTRUMENTS) ×2 IMPLANT
MAT ABSORB  FLUID 56X50 GRAY (MISCELLANEOUS) ×1
MAT ABSORB FLUID 56X50 GRAY (MISCELLANEOUS) ×1 IMPLANT
NDL HYPO 22X1.5 SAFETY MO (MISCELLANEOUS) ×1 IMPLANT
NDL MAYO CATGUT SZ5 (NEEDLE) ×1
NDL SUT 5 .5 CRC TPR PNT MAYO (NEEDLE) ×1 IMPLANT
NEEDLE HYPO 22X1.5 SAFETY MO (MISCELLANEOUS) ×1 IMPLANT
PACK ARTHROSCOPY KNEE (MISCELLANEOUS) ×1 IMPLANT
PAD ABD DERMACEA PRESS 5X9 (GAUZE/BANDAGES/DRESSINGS) ×2 IMPLANT
PAD WRAPON POLAR KNEE (MISCELLANEOUS) ×1 IMPLANT
SLEEVE REMOTE CONTROL 5X12 (DRAPES) IMPLANT
SPONGE T-LAP 18X18 ~~LOC~~+RFID (SPONGE) ×1 IMPLANT
SUT BONE WAX W31G (SUTURE) ×1 IMPLANT
SUT ETHILON 3-0 FS-10 30 BLK (SUTURE) ×1
SUT MNCRL AB 4-0 PS2 18 (SUTURE) ×1 IMPLANT
SUT VIC AB 2-0 CT1 (SUTURE) ×1 IMPLANT
SUTURE EHLN 3-0 FS-10 30 BLK (SUTURE) ×1 IMPLANT
TRAP FLUID SMOKE EVACUATOR (MISCELLANEOUS) ×1 IMPLANT
TUBE SET DOUBLEFLO INFLOW (TUBING) ×1 IMPLANT
TUBE SET DOUBLEFLO OUTFLOW (TUBING) ×1 IMPLANT
WAND WEREWOLF FLOW 90D (MISCELLANEOUS) ×1 IMPLANT
WATER STERILE IRR 500ML POUR (IV SOLUTION) ×1 IMPLANT
WRAPON POLAR PAD KNEE (MISCELLANEOUS) ×1

## 2023-01-13 NOTE — Discharge Instructions (Addendum)
Arthroscopic Knee Surgery - Partial Meniscectomy   Post-Op Instructions   1. Bracing or crutches: Crutches will be provided at the time of discharge from the surgery center if you do not already have them.   2. Ice: You may be provided with a device [facility]) that allows you to ice the affected area effectively. Otherwise you can ice manually.    3. Driving:  Plan on not driving for at least two weeks. Please note that you are advised NOT to drive while taking narcotic pain medications as you may be impaired and unsafe to drive.   4. Activity: Ankle pumps several times an hour while awake to prevent blood clots. Weight bearing: as tolerated. Use crutches for as needed (usually ~1 week or less) until pain allows you to ambulate without a limp. Bending and straightening the knee is unlimited. Elevate knee above heart level as much as possible for one week. Avoid standing more than 5 minutes (consecutively) for the first week.  Avoid long distance travel for 2 weeks.  5. Medications:  - You have been provided a prescription for narcotic pain medicine. After surgery, take 1-2 narcotic tablets every 4 hours if needed for severe pain.  - You may take up to 3000mg /day of tylenol (acetaminophen). You can take 1000mg  3x/day. Please check your narcotic. If you have acetaminophen in your narcotic (each tablet will be 325mg ), be careful not to exceed a total of 3000mg /day of acetaminophen.  - A prescription for anti-nausea medication will be provided in case the narcotic medicine or anesthesia causes nausea - take 1 tablet every 6 hours only if nauseated.  - Take ibuprofen 800 mg every 8 hours WITH food to reduce post-operative knee swelling. DO NOT STOP IBUPROFEN POST-OP UNTIL INSTRUCTED TO DO SO at first post-op office visit (10-14 days after surgery). However, please discontinue if you have any abdominal discomfort after taking this.  - Take enteric coated aspirin 325 mg once daily for 2 weeks to prevent  blood clots.    6. Bandages: The physical therapist should change the bandages at the first post-op appointment. If needed, the dressing supplies have been provided to you.   7. Physical Therapy: 1-2 times per week for 6 weeks. Therapy typically starts on post operative Day 3 or 4. You have been provided an order for physical therapy. The therapist will provide home exercises.   8. Work: May do light duty/desk job in approximately 1-2 weeks when off of narcotics, pain is well-controlled, and swelling has decreased. Labor intensive jobs may require 4-6 weeks to return.      9. Post-Op Appointments: Your first post-op appointment will be with Dr. Allena Katz in approximately 2 weeks time.          If you find that they have not been scheduled please call the Orthopaedic Appointment front desk at [phone]. AMBULATORY SURGERY  DISCHARGE INSTRUCTIONS   The drugs that you were given will stay in your system until tomorrow so for the next 24 hours you should not:  Drive an automobile Make any legal decisions Drink any alcoholic beverage   You may resume regular meals tomorrow.  Today it is better to start with liquids and gradually work up to solid foods.  You may eat anything you prefer, but it is better to start with liquids, then soup and crackers, and gradually work up to solid foods.   Please notify your doctor immediately if you have any unusual bleeding, trouble breathing, redness and pain at  the surgery site, drainage, fever, or pain not relieved by medication.    Additional Instructions:        Please contact your physician with any problems or Same Day Surgery at [phone], Monday through Friday 6 am to 4 pm, or [place] at [facility] number at [phone].    POLAR CARE INFORMATION  MassAdvertisement.it  How to use B[employer] Polar Care [facility] Therapy System?  YouTube   [email or web address].[email or web address]  OPERATING INSTRUCTIONS  Start the  product With dry hands, connect the transformer to the electrical connection located on the top of the cooler. Next, plug the transformer into an appropriate electrical outlet. The unit will automatically start running at this point.  To stop the pump, disconnect electrical power.  Unplug to stop the product when not in use. Unplugging the Polar Care unit turns it off. Always unplug immediately after use. Never leave it plugged in while unattended. Remove pad.    FIRST ADD WATER TO FILL LINE, THEN ICE---Replace ice when existing ice is almost melted  1 Discuss Treatment with your Licensed Health Care Practitioner and Use Only as Prescribed 2 Apply Insulation Barrier & Cold Therapy Pad 3 Check for Moisture 4 Inspect Skin Regularly  Tips and Trouble Shooting Usage Tips 1. Use cubed or chunked ice for optimal performance. 2. It is recommended to drain the Pad between uses. To drain the pad, hold the Pad upright with the hose pointed toward the ground. Depress the black plunger and allow water to drain out. 3. You may disconnect the Pad from the unit without removing the pad from the affected area by depressing the silver tabs on the hose coupling and gently pulling the hoses apart. The Pad and unit will seal itself and will not leak. Note: Some dripping during release is normal. 4. DO NOT RUN PUMP WITHOUT WATER! The pump in this unit is designed to run with water. Running the unit without water will cause permanent damage to the pump. 5. Unplug unit before removing lid.  TROUBLESHOOTING GUIDE Pump not running, Water not flowing to the pad, Pad is not getting cold 1. Make sure the transformer is plugged into the wall outlet. 2. Confirm that the ice and water are filled to the indicated levels. 3. Make sure there are no kinks in the pad. 4. Gently pull on the blue tube to make sure the tube/pad junction is straight. 5. Remove the pad from the treatment site and ll it while the pad is lying at;  then reapply. 6. Confirm that the pad couplings are securely attached to the unit. Listen for the double clicks (Figure 1) to confirm the pad couplings are securely attached.  Leaks    Note: Some condensation on the lines, controller, and pads is unavoidable, especially in warmer climates. 1. If using a [employer] Polar Care Cold Therapy unit with a detachable Cold Therapy Pad, and a leak exists (other than condensation on the lines) disconnect the pad couplings. Make sure the silver tabs on the couplings are depressed before reconnecting the pad to the pump hose; then confirm both sides of the coupling are properly clicked in. 2. If the coupling continues to leak or a leak is detected in the pad itself, stop using it and call [employer] Customer Care at [phone].  Cleaning After use, empty and dry the unit with a soft cloth. Warm water and mild detergent may be used occasionally to clean the pump and tubes.  WARNING: The Polar Care Cube can  be cold enough to cause serious injury, including full skin necrosis. Follow these Operating Instructions, and carefully read the Product Insert (see pouch on side of unit) and the Cold Therapy Pad Fitting Instructions (provided with each Cold Therapy Pad) prior to use.       POLAR CARE INFORMATION  MassAdvertisement.it  How to use Breg Polar Care [facility] Therapy System?  YouTube   ShippingScam.co.uk  OPERATING INSTRUCTIONS  Start the product With dry hands, connect the transformer to the electrical connection located on the top of the cooler. Next, plug the transformer into an appropriate electrical outlet. The unit will automatically start running at this point.  To stop the pump, disconnect electrical power.  Unplug to stop the product when not in use. Unplugging the Polar Care unit turns it off. Always unplug immediately after use. Never leave it plugged in while unattended. Remove pad.    FIRST ADD WATER TO FILL LINE, THEN  ICE---Replace ice when existing ice is almost melted  1 Discuss Treatment with your Licensed Health Care Practitioner and Use Only as Prescribed 2 Apply Insulation Barrier & Cold Therapy Pad 3 Check for Moisture 4 Inspect Skin Regularly  Tips and Trouble Shooting Usage Tips 1. Use cubed or chunked ice for optimal performance. 2. It is recommended to drain the Pad between uses. To drain the pad, hold the Pad upright with the hose pointed toward the ground. Depress the black plunger and allow water to drain out. 3. You may disconnect the Pad from the unit without removing the pad from the affected area by depressing the silver tabs on the hose coupling and gently pulling the hoses apart. The Pad and unit will seal itself and will not leak. Note: Some dripping during release is normal. 4. DO NOT RUN PUMP WITHOUT WATER! The pump in this unit is designed to run with water. Running the unit without water will cause permanent damage to the pump. 5. Unplug unit before removing lid.  TROUBLESHOOTING GUIDE Pump not running, Water not flowing to the pad, Pad is not getting cold 1. Make sure the transformer is plugged into the wall outlet. 2. Confirm that the ice and water are filled to the indicated levels. 3. Make sure there are no kinks in the pad. 4. Gently pull on the blue tube to make sure the tube/pad junction is straight. 5. Remove the pad from the treatment site and ll it while the pad is lying at; then reapply. 6. Confirm that the pad couplings are securely attached to the unit. Listen for the double clicks (Figure 1) to confirm the pad couplings are securely attached.  Leaks    Note: Some condensation on the lines, controller, and pads is unavoidable, especially in warmer climates. 1. If using a Breg Polar Care Cold Therapy unit with a detachable Cold Therapy Pad, and a leak exists (other than condensation on the lines) disconnect the pad couplings. Make sure the silver tabs on the couplings  are depressed before reconnecting the pad to the pump hose; then confirm both sides of the coupling are properly clicked in. 2. If the coupling continues to leak or a leak is detected in the pad itself, stop using it and call [employer] Customer Care at [phone].  Cleaning After use, empty and dry the unit with a soft cloth. Warm water and mild detergent may be used occasionally to clean the pump and tubes.  WARNING: The Polar Care Cube can be cold enough to cause serious injury,  including full skin necrosis. Follow these Operating Instructions, and carefully read the Product Insert (see pouch on side of unit) and the Cold Therapy Pad Fitting Instructions (provided with each Cold Therapy Pad) prior to use.

## 2023-01-13 NOTE — Progress Notes (Signed)
Upon arrival attempted to draw Chem-8 with 22G butterfly with Vacutainer in RAC, with very slow blood return.  Blood test and results found to be severely abnormal.  Redrew Chem-8 with 20G IV catheter. Blood results came back to be K+ 5.3. and iCa 0.89.  Patient states has been taking Potassium supplements and calcium does typically run low. Anesthesia made aware, okay with proceeding based on those results.

## 2023-01-13 NOTE — H&P (Signed)
Paper H&P to be scanned into permanent record. H&P reviewed. No significant changes noted.  

## 2023-01-13 NOTE — Transfer of Care (Signed)
Immediate Anesthesia Transfer of Care Note  Patient: Tiffany Humphrey  Procedure(s) Performed: Right knee diagnostic arthroscopy, partial synovectomy, and/or chondroplasty (Right: Knee)  Patient Location: PACU  Anesthesia Type:General  Level of Consciousness: awake, alert , and oriented  Airway & Oxygen Therapy: Patient Spontanous Breathing and Patient connected to face mask oxygen  Post-op Assessment: Report given to RN and Post -op Vital signs reviewed and stable  Post vital signs: Reviewed and stable  Last Vitals:  Vitals Value Taken Time  BP 114/73 01/13/23 1315  Temp 97.5   Pulse 80 01/13/23 1317  Resp 19 01/13/23 1317  SpO2 99 % 01/13/23 1317  Vitals shown include unvalidated device data.  Last Pain:  Vitals:   01/13/23 1115  TempSrc: Temporal  PainSc: 5          Complications: No notable events documented.

## 2023-01-13 NOTE — Anesthesia Postprocedure Evaluation (Signed)
Anesthesia Post Note  Patient: Tiffany Humphrey  Procedure(s) Performed: Right knee diagnostic arthroscopy, partial synovectomy, and/or chondroplasty (Right: Knee)  Patient location during evaluation: PACU Anesthesia Type: General Level of consciousness: awake and alert Pain management: pain level controlled Vital Signs Assessment: post-procedure vital signs reviewed and stable Respiratory status: spontaneous breathing, nonlabored ventilation, respiratory function stable and patient connected to nasal cannula oxygen Cardiovascular status: blood pressure returned to baseline and stable Postop Assessment: no apparent nausea or vomiting Anesthetic complications: no   No notable events documented.   Last Vitals:  Vitals:   01/13/23 1345 01/13/23 1407  BP: 101/73 93/67  Pulse: 77 76  Resp: 19 18  Temp: 36.8 C (!) 36.3 C  SpO2: 92% 96%    Last Pain:  Vitals:   01/13/23 1407  TempSrc: Temporal  PainSc: 3                  Cleda Mccreedy Auther Lyerly

## 2023-01-13 NOTE — Anesthesia Procedure Notes (Addendum)
Procedure Name: LMA Insertion Date/Time: 01/13/2023 12:02 PM  Performed by: Kailani Brass, [place], CRNAPre-anesthesia Checklist: Patient identified, Patient being monitored, Timeout performed, Emergency Drugs available and Suction available Patient Re-evaluated:Patient Re-evaluated prior to induction Oxygen Delivery Method: Circle system utilized Preoxygenation: Pre-oxygenation with 100% oxygen Induction Type: IV induction Ventilation: Mask ventilation without difficulty LMA: LMA inserted LMA Size: 4.0 Tube type: Oral Number of attempts: 1 Placement Confirmation: positive ETCO2 and breath sounds checked- equal and bilateral Tube secured with: Tape Dental Injury: Teeth and Oropharynx as per pre-operative assessment

## 2023-01-13 NOTE — Anesthesia Preprocedure Evaluation (Signed)
Anesthesia Evaluation  Patient identified by MRN, date of birth, ID band Patient awake    Reviewed: Allergy & Precautions, NPO status , Patient's Chart, lab work & pertinent test results  History of Anesthesia Complications (+) PONV and history of anesthetic complications  Airway Mallampati: II  TM Distance: >3 FB Neck ROM: Full    Dental no notable dental hx. (+) Dental Advidsory Given   Pulmonary neg sleep apnea, neg COPD, former smoker   breath sounds clear to auscultation- rhonchi (-) wheezing      Cardiovascular Exercise Tolerance: Good hypertension, Pt. on medications (-) CAD, (-) Past MI, (-) Cardiac Stents and (-) CABG  Rhythm:Regular Rate:Normal - Systolic murmurs and - Diastolic murmurs    Neuro/Psych  Headaches, neg Seizures PSYCHIATRIC DISORDERS Anxiety        GI/Hepatic Neg liver ROS,GERD  Medicated and Controlled,,  Endo/Other  negative endocrine ROSneg diabetes    Renal/GU negative Renal ROS     Musculoskeletal  (+) Arthritis ,    Abdominal  (+) - obese  Peds  Hematology negative hematology ROS (+)   Anesthesia Other Findings Past Medical History: No date: Anxiety 06/2018: Breast cancer (HCC)     Comment:  left breast No date: Cancer (HCC)     Comment:  LEFT BREAST  No date: Complication of anesthesia     Comment:  panic attacks No date: GERD (gastroesophageal reflux disease) No date: Headache     Comment:  migraines/stress, optical migraines No date: History of kidney stones No date: Hypertension No date: Multilevel degenerative disc disease No date: Personal history of radiation therapy No date: PONV (postoperative nausea and vomiting) No date: Vertigo     Comment:  last episode 10/2016-NO PROBLEMS SINCE SINUS SURGERY IN               2018 No date: Wears contact lenses   Reproductive/Obstetrics                             Anesthesia Physical Anesthesia  Plan  ASA: 2  Anesthesia Plan: General   Post-op Pain Management:    Induction: Intravenous  PONV Risk Score and Plan: 3 and Propofol infusion and TIVA  Airway Management Planned: LMA  Additional Equipment:   Intra-op Plan:   Post-operative Plan: Extubation in OR  Informed Consent: I have reviewed the patients History and Physical, chart, labs and discussed the procedure including the risks, benefits and alternatives for the proposed anesthesia with the patient or authorized representative who has indicated his/her understanding and acceptance.     Dental Advisory Given  Plan Discussed with: Anesthesiologist, CRNA and Surgeon  Anesthesia Plan Comments: (Patient consented for risks of anesthesia including but not limited to:  - adverse reactions to medications - damage to eyes, teeth, lips or other oral mucosa - nerve damage due to positioning  - sore throat or hoarseness - Damage to heart, brain, nerves, lungs, other parts of body or loss of life  Patient voiced understanding.)        Anesthesia Quick Evaluation

## 2023-01-13 NOTE — Op Note (Signed)
Operative Note    SURGERY DATE: 01/13/2023   PRE-OP DIAGNOSIS:  1. 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 Right knee medial femoral condyle and medial distal trochlea degenerative changes 2. Right medial meniscus tear (posterior horn/body junction affecting ~15% of meniscus width)   PROCEDURES:  1. Right knee arthroscopy, partial medial meniscectomy (posterior horn/body junction affecting ~15% of meniscus width) 2. Right knee chondroplasty of patellofemoral and medial compartments   SURGEON: Rosealee Albee, MD   ANESTHESIA: Gen   ESTIMATED BLOOD LOSS: minimal   TOTAL IV FLUIDS: per anesthesia   INDICATION(S):  Tiffany Humphrey is a 52 y.o. female with chronic failed extensive nonoperative management consisting of multiple injections, activity modifications, physical therapy exercises and medical management.  She had with signs and symptoms as well as MRI finding of possible meniscus tearing, medial compartment degenerative changes, and some synovitic appearing changes in the patellofemoral joint. After discussion of risks, benefits, and alternatives to surgery, the patient elected to proceed.   OPERATIVE FINDINGS:    Examination under anesthesia: A careful examination under anesthesia was performed.  Passive range of motion was: Hyperextension: 2.  Extension: 0.  Flexion: 130.  Lachman: normal. Pivot Shift: normal.  Posterior drawer: normal.  Varus stability in full extension: normal.  Varus stability in 30 degrees of flexion: normal.  Valgus stability in full extension: normal.  Valgus stability in 30 degrees of flexion: normal.   Intra-operative findings: A thorough arthroscopic examination of the knee was performed.  The findings are: 1. Suprapatellar pouch: Normal 2. Undersurface of median ridge: Grade 1 softening 3. Medial patellar facet: Grade 1 softening 4. Lateral patellar facet: Grade 1 softening 5. Trochlea: Normal  except 3 small areas of blistering measuring approximately 3 x 3 mm each 6. Lateral gutter/popliteus tendon: Normal 7. Hoffa's fat pad: Inflamed 8. Medial gutter/plica: Normal 9. ACL: Normal 10. PCL: Normal 11. Medial meniscus: Small, free edge tear of the posterior horn/body affecting approximately 15% of the meniscus width 12. Medial compartment cartilage: 8 4 chondral defect of the proximal medial femoral condyle/distal aspect of the medial trochlea measuring approximately 5 mm medial-lateral x13 mm anterior-posterior.  In extension, the age of this lesion appeared to be in the weightbearing portion.  More distally along the medial femoral condyle, there was extensive grade 2-3 degenerative change.  There were focal areas of grade 2 degenerative change to the tibial plateau peripherally. 13. Lateral meniscus: Normal 14. Lateral compartment cartilage: Grade 1 degenerative changes to the tibial plateau, normal lateral femoral condyle   OPERATIVE REPORT:     I identified Tiffany Humphrey in the pre-operative holding area. I marked the operative knee with my initials. I reviewed the risks and benefits of the proposed surgical intervention and the patient wished to proceed. The patient was transferred to the operative suite and placed in the supine position with all bony prominences padded.  Anesthesia was administered. Appropriate IV antibiotics were administered prior to incision. The extremity was then prepped and draped in standard fashion. A time out was performed confirming the correct extremity, correct patient, and correct procedure.   Arthroscopy portals were marked. Local anesthetic was injected to the planned portal sites. The anterolateral portal was established with an 11 blade.      The arthroscope was placed in the anterolateral portal and then into the suprapatellar pouch. Next, the medial portal was established under needle localization. A diagnostic knee scope was completed with the  above findings. The small medial meniscus tear was identified.  Hoffa's fat pad was debrided as there is some inflammatory and appearing tissue.   The meniscal tear was debrided using an arthroscopic biter and an oscillating shaver until the meniscus had stable borders. A chondroplasty was performed of the medial compartment such that there were stable cartilage edges without any loose fragments of cartilage.  The lateral most of the 3 trochlear blisters was also debrided with an oscillating shaver as there was some fissuring involved.  Addressing the other 2 blisters would likely lead to more chondral damage so they were left intact.  Arthroscopic fluid was then removed from the joint.   The portals were closed with 3-0 Nylon suture. Sterile dressings included Xeroform, 4x4s, Sof-Rol, and Bias wrap. A Polarcare was placed.  The patient was then awakened and taken to the PACU hemodynamically stable without complication.     POSTOPERATIVE PLAN: The patient will be discharged home today once they meet PACU criteria. Aspirin 325 mg daily was prescribed for 2 weeks for DVT prophylaxis.  Physical therapy will start on POD#3-4. Weight-bearing as tolerated. Follow up in 2 weeks per protocol.

## 2023-01-15 ENCOUNTER — Encounter: Payer: Self-pay | Admitting: Orthopedic Surgery

## 2023-01-16 ENCOUNTER — Telehealth: Payer: Self-pay

## 2023-01-20 ENCOUNTER — Encounter: Payer: Self-pay | Admitting: Oncology

## 2023-02-24 ENCOUNTER — Telehealth: Payer: Self-pay | Admitting: Family Medicine

## 2023-02-24 NOTE — Telephone Encounter (Signed)
Copied from CRM [id number]. Topic: General - Inquiry >> Feb 24, 2023 12:45 PM Patsy Lager T wrote: Reason for CRM: patient called requesting Delice Bison call her back. Patient refused to give a reason

## 2023-03-07 ENCOUNTER — Ambulatory Visit: Payer: Managed Care, Other (non HMO) | Admitting: Family Medicine

## 2023-03-07 DIAGNOSIS — F5101 Primary insomnia: Secondary | ICD-10-CM

## 2023-03-07 DIAGNOSIS — K219 Gastro-esophageal reflux disease without esophagitis: Secondary | ICD-10-CM

## 2023-03-07 DIAGNOSIS — R601 Generalized edema: Secondary | ICD-10-CM | POA: Diagnosis not present

## 2023-03-07 DIAGNOSIS — R030 Elevated blood-pressure reading, without diagnosis of hypertension: Secondary | ICD-10-CM | POA: Diagnosis not present

## 2023-03-07 DIAGNOSIS — F419 Anxiety disorder, unspecified: Secondary | ICD-10-CM | POA: Diagnosis not present

## 2023-03-07 DIAGNOSIS — F329 Major depressive disorder, single episode, unspecified: Secondary | ICD-10-CM

## 2023-03-07 MED ORDER — SERTRALINE HCL 50 MG PO TABS: 50.0000 mg | ORAL_TABLET | Freq: Every day | ORAL | 1 refills | Status: DC

## 2023-03-07 MED ORDER — OMEPRAZOLE 40 MG PO CPDR: 40.0000 mg | DELAYED_RELEASE_CAPSULE | Freq: Every day | ORAL | 1 refills | Status: DC

## 2023-03-07 MED ORDER — TRAZODONE HCL 50 MG PO TABS
50.0000 mg | ORAL_TABLET | Freq: Every day | ORAL | 1 refills | Status: DC
Start: 2023-03-07 — End: 2023-09-02

## 2023-03-07 MED ORDER — HYDROCHLOROTHIAZIDE 12.5 MG PO CAPS: 12.5000 mg | ORAL_CAPSULE | Freq: Every day | ORAL | 1 refills | Status: DC

## 2023-03-07 NOTE — Patient Instructions (Signed)
Calorie Counting for Weight Loss Calories are units of energy. Your body needs a certain number of calories from food to keep going throughout the day. When you eat or drink more calories than your body needs, your body stores the extra calories mostly as fat. When you eat or drink fewer calories than your body needs, your body burns fat to get the energy it needs. Calorie counting means keeping track of how many calories you eat and drink each day. Calorie counting can be helpful if you need to lose weight. If you eat fewer calories than your body needs, you should lose weight. Ask your health care provider what a healthy weight is for you. For calorie counting to work, you will need to eat the right number of calories each day to lose a healthy amount of weight per week. A dietitian can help you figure out how many calories you need in a day and will suggest ways to reach your calorie goal. A healthy amount of weight to lose each week is usually 1-2 lb (0.5-0.9 kg). This usually means that your daily calorie intake should be reduced by 500-750 calories. Eating 1,200-1,500 calories a day can help most women lose weight. Eating 1,500-1,800 calories a day can help most men lose weight. What do I need to know about calorie counting? Work with your health care provider or dietitian to determine how many calories you should get each day. To meet your daily calorie goal, you will need to: Find out how many calories are in each food that you would like to eat. Try to do this before you eat. Decide how much of the food you plan to eat. Keep a food log. Do this by writing down what you ate and how many calories it had. To successfully lose weight, it is important to balance calorie counting with a healthy lifestyle that includes regular activity. Where do I find calorie information?  The number of calories in a food can be found on a Nutrition Facts label. If a food does not have a Nutrition Facts label, try  to look up the calories online or ask your dietitian for help. Remember that calories are listed per serving. If you choose to have more than one serving of a food, you will have to multiply the calories per serving by the number of servings you plan to eat. For example, the label on a package of bread might say that a serving size is 1 slice and that there are 90 calories in a serving. If you eat 1 slice, you will have eaten 90 calories. If you eat 2 slices, you will have eaten 180 calories. How do I keep a food log? After each time that you eat, record the following in your food log as soon as possible: What you ate. Be sure to include toppings, sauces, and other extras on the food. How much you ate. This can be measured in cups, ounces, or number of items. How many calories were in each food and drink. The total number of calories in the food you ate. Keep your food log near you, such as in a pocket-sized notebook or on an app or website on your mobile phone. Some programs will calculate calories for you and show you how many calories you have left to meet your daily goal. What are some portion-control tips? Know how many calories are in a serving. This will help you know how many servings you can have of a certain   food. Use a measuring cup to measure serving sizes. You could also try weighing out portions on a kitchen scale. With time, you will be able to estimate serving sizes for some foods. Take time to put servings of different foods on your favorite plates or in your favorite bowls and cups so you know what a serving looks like. Try not to eat straight from a food's packaging, such as from a bag or box. Eating straight from the package makes it hard to see how much you are eating and can lead to overeating. Put the amount you would like to eat in a cup or on a plate to make sure you are eating the right portion. Use smaller plates, glasses, and bowls for smaller portions and to prevent  overeating. Try not to multitask. For example, avoid watching TV or using your computer while eating. If it is time to eat, sit down at a table and enjoy your food. This will help you recognize when you are full. It will also help you be more mindful of what and how much you are eating. What are tips for following this plan? Reading food labels Check the calorie count compared with the serving size. The serving size may be smaller than what you are used to eating. Check the source of the calories. Try to choose foods that are high in protein, fiber, and vitamins, and low in saturated fat, trans fat, and sodium. Shopping Read nutrition labels while you shop. This will help you make healthy decisions about which foods to buy. Pay attention to nutrition labels for low-fat or fat-free foods. These foods sometimes have the same number of calories or more calories than the full-fat versions. They also often have added sugar, starch, or salt to make up for flavor that was removed with the fat. Make a grocery list of lower-calorie foods and stick to it. Cooking Try to cook your favorite foods in a healthier way. For example, try baking instead of frying. Use low-fat dairy products. Meal planning Use more fruits and vegetables. One-half of your plate should be fruits and vegetables. Include lean proteins, such as chicken, turkey, and fish. Lifestyle Each week, aim to do one of the following: 150 minutes of moderate exercise, such as walking. 75 minutes of vigorous exercise, such as running. General information Know how many calories are in the foods you eat most often. This will help you calculate calorie counts faster. Find a way of tracking calories that works for you. Get creative. Try different apps or programs if writing down calories does not work for you. What foods should I eat?  Eat nutritious foods. It is better to have a nutritious, high-calorie food, such as an avocado, than a food with  few nutrients, such as a bag of potato chips. Use your calories on foods and drinks that will fill you up and will not leave you hungry soon after eating. Examples of foods that fill you up are nuts and nut butters, vegetables, lean proteins, and high-fiber foods such as whole grains. High-fiber foods are foods with more than 5 g of fiber per serving. Pay attention to calories in drinks. Low-calorie drinks include water and unsweetened drinks. The items listed above may not be a complete list of foods and beverages you can eat. Contact a dietitian for more information. What foods should I limit? Limit foods or drinks that are not good sources of vitamins, minerals, or protein or that are high in unhealthy fats. These   include: Candy. Other sweets. Sodas, specialty coffee drinks, alcohol, and juice. The items listed above may not be a complete list of foods and beverages you should avoid. Contact a dietitian for more information. How do I count calories when eating out? Pay attention to portions. Often, portions are much larger when eating out. Try these tips to keep portions smaller: Consider sharing a meal instead of getting your own. If you get your own meal, eat only half of it. Before you start eating, ask for a container and put half of your meal into it. When available, consider ordering smaller portions from the menu instead of full portions. Pay attention to your food and drink choices. Knowing the way food is cooked and what is included with the meal can help you eat fewer calories. If calories are listed on the menu, choose the lower-calorie options. Choose dishes that include vegetables, fruits, whole grains, low-fat dairy products, and lean proteins. Choose items that are boiled, broiled, grilled, or steamed. Avoid items that are buttered, battered, fried, or served with cream sauce. Items labeled as crispy are usually fried, unless stated otherwise. Choose water, low-fat milk,  unsweetened iced tea, or other drinks without added sugar. If you want an alcoholic beverage, choose a lower-calorie option, such as a glass of wine or light beer. Ask for dressings, sauces, and syrups on the side. These are usually high in calories, so you should limit the amount you eat. If you want a salad, choose a garden salad and ask for grilled meats. Avoid extra toppings such as bacon, cheese, or fried items. Ask for the dressing on the side, or ask for olive oil and vinegar or lemon to use as dressing. Estimate how many servings of a food you are given. Knowing serving sizes will help you be aware of how much food you are eating at restaurants. Where to find more information Centers for Disease Control and Prevention: www.cdc.gov [place] Department of Agriculture: myplate.gov Summary Calorie counting means keeping track of how many calories you eat and drink each day. If you eat fewer calories than your body needs, you should lose weight. A healthy amount of weight to lose per week is usually 1-2 lb (0.5-0.9 kg). This usually means reducing your daily calorie intake by 500-750 calories. The number of calories in a food can be found on a Nutrition Facts label. If a food does not have a Nutrition Facts label, try to look up the calories online or ask your dietitian for help. Use smaller plates, glasses, and bowls for smaller portions and to prevent overeating. Use your calories on foods and drinks that will fill you up and not leave you hungry shortly after a meal. This information is not intended to replace advice given to you by your health care provider. Make sure you discuss any questions you have with your health care provider. Document Revised: 09/26/2019 Document Reviewed: 09/26/2019 Elsevier Patient Education  2023 Elsevier Inc.  

## 2023-03-07 NOTE — Progress Notes (Signed)
Date:  03/07/2023   Name:  Tiffany Humphrey   DOB:  03-Aug-1971   MRN:  [id number]   Chief Complaint: Hypertension, Gastroesophageal Reflux, Depression, and Insomnia  Hypertension This is a chronic problem. The current episode started more than 1 year ago. The problem has been gradually improving since onset. The problem is controlled. Pertinent negatives include no blurred vision, chest pain, headaches, malaise/fatigue, orthopnea, palpitations, PND, shortness of breath or sweats. There are no associated agents to hypertension. There are no known risk factors for coronary artery disease. Past treatments include diuretics. The current treatment provides moderate improvement. There are no compliance problems.  There is no history of angina, CAD/MI, heart failure, left ventricular hypertrophy or PVD. There is no history of chronic renal disease, a hypertension causing med or renovascular disease.  Gastroesophageal Reflux She reports no abdominal pain, no chest pain, no coughing, no dysphagia, no heartburn, no nausea or no wheezing. This is a chronic problem. The current episode started more than 1 year ago. The problem has been gradually improving. Pertinent negatives include no fatigue or melena. There are no known risk factors. She has tried a PPI for the symptoms. The treatment provided moderate relief.  Depression        This is a chronic problem.  The onset quality is sudden.   The problem occurs intermittently.  The problem has been gradually improving since onset.  Associated symptoms include insomnia.  Associated symptoms include no fatigue, no helplessness, no hopelessness, no decreased interest, no body aches, no headaches and not sad.  Past treatments include SSRIs - Selective serotonin reuptake inhibitors.  Compliance with treatment is good.  Previous treatment provided mild relief. Insomnia Primary symptoms: no fragmented sleep, no sleep disturbance, no difficulty falling asleep, no  somnolence, no frequent awakening, no premature morning awakening, no malaise/fatigue, no napping.   The onset quality is sudden. The symptoms are aggravated by SSRI use. The treatment provided moderate relief. PMH includes: no hypertension, depression, no restless leg syndrome.     Lab Results  Component Value Date   NA 136 01/13/2023   K 5.3 (H) 01/13/2023   CO2 24 01/12/2023   GLUCOSE 80 01/13/2023   BUN 6 01/13/2023   CREATININE 0.70 01/13/2023   CALCIUM 8.4 (L) 01/12/2023   EGFR 88 09/06/2022   GFRNONAA >60 01/12/2023   Lab Results  Component Value Date   CHOL 182 09/06/2022   HDL 57 09/06/2022   LDLCALC 100 (H) 09/06/2022   TRIG 142 09/06/2022   Lab Results  Component Value Date   TSH 1.730 07/01/2019   No results found for: "HGBA1C" Lab Results  Component Value Date   WBC 7.4 01/12/2023   HGB 13.6 01/13/2023   HCT 40.0 01/13/2023   MCV 90.5 01/12/2023   PLT 233 01/12/2023   Lab Results  Component Value Date   ALT 25 09/06/2022   AST 34 09/06/2022   ALKPHOS 67 09/06/2022   BILITOT 0.4 09/06/2022   No results found for: "25OHVITD2", "25OHVITD3", "VD25OH"   Review of Systems  Constitutional:  Negative for fatigue and malaise/fatigue.  Eyes:  Negative for blurred vision.  Respiratory:  Negative for cough, chest tightness, shortness of breath and wheezing.   Cardiovascular:  Negative for chest pain, palpitations, orthopnea and PND.  Gastrointestinal:  Negative for abdominal pain, dysphagia, heartburn, melena and nausea.  Neurological:  Negative for headaches.  Psychiatric/Behavioral:  Positive for depression. Negative for sleep disturbance. The patient has insomnia.  Patient Active Problem List   Diagnosis Date Noted   Menorrhagia with regular cycle 03/16/2022   Lymphedema of breast 01/29/2019   Genetic testing 01/14/2019   Malignant neoplasm of upper-outer quadrant of left breast in female, estrogen receptor positive (HCC) 07/06/2018   Breast cancer  (HCC) 06/18/2018   Migraines 08/03/2015    Allergies  Allergen Reactions   Quinolones Hives   Clindamycin Hives   Codeine Other (See Comments) and Palpitations    tachycardia    Past Surgical History:  Procedure Laterality Date   BREAST BIOPSY Right 06/08/2016   CYSTIC PAPILLARY APOCRINE METAPLASIA.    BREAST BIOPSY Left 06/06/2018   left breast stereo X clip positive   BREAST EXCISIONAL BIOPSY Left 06/22/2018   lumpectomy with sn and nl   BREAST EXCISIONAL BIOPSY Left 05/31/2021   removal of fat necrosis due to pain   BREAST LUMPECTOMY Left 06/22/2018   COLONOSCOPY  03/28/2022   CYSTOSTOMY Right 05/10/2019   Procedure: RIGHT OVARIAN CYSTOSTOMY;  Surgeon: Feliberto Gottron Ihor Austin, MD;  Location: [facility];  Service: Gynecology;  Laterality: Right;   FRONTAL SINUS EXPLORATION Bilateral 12/22/2016   Procedure: FRONTAL SINUS EXPLORATION;  Surgeon: Vernie Murders, MD;  Location: [facility];  Service: ENT;  Laterality: Bilateral;   IMAGE GUIDED SINUS SURGERY N/A 12/22/2016   Procedure: IMAGE GUIDED SINUS SURGERY;  Surgeon: Vernie Murders, MD;  Location: [facility];  Service: ENT;  Laterality: N/A;   KNEE ARTHROSCOPY Right 01/13/2023   Procedure: Right knee diagnostic arthroscopy, partial synovectomy, and/or chondroplasty;  Surgeon: Signa Kell, MD;  Location: [facility];  Service: Orthopedics;  Laterality: Right;   LAPAROSCOPIC TUBAL LIGATION Bilateral 05/10/2019   Procedure: LAPAROSCOPIC TUBAL LIGATION, falope rings Right fallope ring  Left tubal cautery;  Surgeon: Schermerhorn, Ihor Austin, MD;  Location: [facility];  Service: Gynecology;  Laterality: Bilateral;   MASS EXCISION Left 05/31/2021   Procedure: EXCISION MASS (scar);  Surgeon: Carolan Shiver, MD;  Location: [facility];  Service: General;  Laterality: Left;   MAXILLARY ANTROSTOMY Bilateral 12/22/2016   Procedure: MAXILLARY ANTROSTOMY;  Surgeon: Vernie Murders, MD;  Location: [facility];  Service: ENT;   Laterality: Bilateral;   PARTIAL MASTECTOMY WITH NEEDLE LOCALIZATION Left 06/22/2018   Procedure: PARTIAL MASTECTOMY WITH NEEDLE LOCALIZATION;  Surgeon: Carolan Shiver, MD;  Location: [facility];  Service: General;  Laterality: Left;   SENTINEL NODE BIOPSY Left 06/22/2018   Procedure: SENTINEL NODE BIOPSY;  Surgeon: Carolan Shiver, MD;  Location: [facility];  Service: General;  Laterality: Left;   SEPTOPLASTY N/A 12/22/2016   Procedure: SEPTOPLASTY;  Surgeon: Vernie Murders, MD;  Location: [facility];  Service: ENT;  Laterality: N/A;  GAVE DISK TO [facility] 4-5 KP   TURBINATE REDUCTION N/A 12/22/2016   Procedure: TURBINATE REDUCTION partial inferior;  Surgeon: Vernie Murders, MD;  Location: [facility];  Service: ENT;  Laterality: N/A;    Social History   Tobacco Use   Smoking status: Former    Packs/day: 1.00    Years: 15.00    Additional pack years: 0.00    Total pack years: 15.00    Types: Cigarettes    Quit date: 2013    Years since quitting: 11.5   Smokeless tobacco: Never  Vaping Use   Vaping Use: Former  Substance Use Topics   Alcohol use: Yes    Alcohol/week: 2.0 standard drinks of alcohol    Types: 1 Glasses of wine, 1 Cans of beer per week    Comment: social (4-6  drinks/month)   Drug use: Never     Medication list has been reviewed and updated.  Current Meds  Medication Sig   acetaminophen (TYLENOL) 500 MG tablet Take 2 tablets (1,000 mg total) by mouth every 8 (eight) hours.   ALPRAZolam (XANAX) 0.25 MG tablet Take 1 tablet (0.25 mg total) by mouth daily as needed for anxiety.   calcium-vitamin D (OSCAL WITH D) 500-200 MG-UNIT tablet Take 1 tablet by mouth daily.   Cyanocobalamin (B-12) 2500 MCG SUBL Place 2,500 mcg under the tongue daily.   estradiol (ESTRACE) 0.1 MG/GM vaginal cream Use 1/4 app per vagina 1-2 times weekly   hydrochlorothiazide (MICROZIDE) 12.5 MG capsule Take 1 capsule (12.5 mg total) by mouth daily.   ibuprofen (ADVIL) 800  MG tablet Take 800 mg by mouth every 8 (eight) hours as needed for headache.   levocetirizine (XYZAL) 5 MG tablet Take 5 mg by mouth daily.    meclizine (ANTIVERT) 25 MG tablet Take 25 mg by mouth 3 (three) times daily as needed for dizziness.   Multiple Vitamin (MULTIVITAMIN WITH MINERALS) TABS tablet Take 1 tablet by mouth daily.   NON FORMULARY Place 3 drops under the tongue at bedtime. Allergy Oral Drops made by [facility]   omeprazole (PRILOSEC) 40 MG capsule Take 1 capsule (40 mg total) by mouth daily.   sertraline (ZOLOFT) 50 MG tablet Take 1 tablet (50 mg total) by mouth daily.   tamoxifen (NOLVADEX) 20 MG tablet Take 1 tablet (20 mg total) by mouth daily.   traZODone (DESYREL) 50 MG tablet Take 1 tablet (50 mg total) by mouth at bedtime.   triamcinolone (NASACORT) 55 MCG/ACT AERO nasal inhaler Place 1 spray into the nose daily.   [DISCONTINUED] HYDROcodone-acetaminophen (NORCO) 5-325 MG tablet Take 1-2 tablets by mouth every 4 (four) hours as needed for moderate pain or severe pain.       03/07/2023    9:19 AM 09/06/2022   10:57 AM 03/15/2022   11:25 AM 01/12/2022   11:39 AM  GAD 7 : Generalized Anxiety Score  Nervous, Anxious, on Edge 0 0 1 0  Control/stop worrying 0 0 0 0  Worry too much - different things 0 0 2 0  Trouble relaxing 0 0 1 0  Restless 0 0 0 0  Easily annoyed or irritable 2 0 1 0  Afraid - awful might happen 0 0 0 0  Total GAD 7 Score 2 0 5 0  Anxiety Difficulty Not difficult at all Not difficult at all Not difficult at all Not difficult at all       03/07/2023    9:19 AM 09/06/2022   10:57 AM 03/15/2022   11:25 AM  Depression screen PHQ 2/9  Decreased Interest 0 0 0  Down, Depressed, Hopeless 0 0 0  PHQ - 2 Score 0 0 0  Altered sleeping 0 0 0  Tired, decreased energy 0 0 0  Change in appetite 1 0 0  Feeling bad or failure about yourself  0 0 0  Trouble concentrating 0 0 0  Moving slowly or fidgety/restless 0 0 0  Suicidal thoughts 0 0 0  PHQ-9 Score 1  0 0  Difficult doing work/chores Not difficult at all Not difficult at all Not difficult at all    BP Readings from Last 3 Encounters:  03/07/23 110/78  01/13/23 93/67  09/16/22 108/83    Physical Exam Vitals and nursing note reviewed.  Constitutional:      General: She is  not in acute distress.    Appearance: She is not diaphoretic.  HENT:     Head: Normocephalic and atraumatic.     Right Ear: Tympanic membrane and external ear normal.     Left Ear: Tympanic membrane and external ear normal.     Nose: Nose normal. No congestion or rhinorrhea.     Mouth/Throat:     Mouth: Mucous membranes are moist.     Pharynx: No oropharyngeal exudate or posterior oropharyngeal erythema.  Eyes:     General:        Right eye: No discharge.        Left eye: No discharge.     Conjunctiva/sclera: Conjunctivae normal.     Pupils: Pupils are equal, round, and reactive to light.  Neck:     Thyroid: No thyromegaly.     Vascular: No carotid bruit or JVD.  Cardiovascular:     Rate and Rhythm: Normal rate and regular rhythm.     Heart sounds: Normal heart sounds. No murmur heard.    No friction rub. No gallop.  Pulmonary:     Effort: Pulmonary effort is normal.     Breath sounds: Normal breath sounds. No wheezing, rhonchi or rales.  Abdominal:     General: Bowel sounds are normal.     Palpations: Abdomen is soft. There is no mass.     Tenderness: There is no abdominal tenderness. There is no guarding or rebound.  Musculoskeletal:        General: Normal range of motion.     Cervical back: Normal range of motion and neck supple. No tenderness.  Lymphadenopathy:     Cervical: No cervical adenopathy.  Skin:    General: Skin is warm and dry.     Capillary Refill: Capillary refill takes less than 2 seconds.     Findings: No bruising.  Neurological:     General: No focal deficit present.     Mental Status: She is alert.     Motor: No weakness.     Coordination: Coordination normal.     Deep  Tendon Reflexes: Reflexes are normal and symmetric.     Wt Readings from Last 3 Encounters:  03/07/23 181 lb (82.1 kg)  01/13/23 170 lb (77.1 kg)  01/11/23 170 lb (77.1 kg)    BP 110/78   Pulse 78   Ht 5\' 5"  (1.651 m)   Wt 181 lb (82.1 kg)   LMP 05/25/2020 (Approximate)   SpO2 96%   BMI 30.12 kg/m   Assessment and Plan: 1. Elevated blood pressure reading without diagnosis of hypertension Chronic.  Controlled.  Stable.  Blood pressure 100/78.  Asymptomatic.  Tolerating medications well.  Continue hydrochlorothiazide 12.5 mg daily.  Will recheck labs at later date as patient is just returned from vacation.  Will recheck in 6 months. - hydrochlorothiazide (MICROZIDE) 12.5 MG capsule; Take 1 capsule (12.5 mg total) by mouth daily.  Dispense: 90 capsule; Refill: 1  2. Generalized edema Chronic.  Controlled.  Stable.  Continue hydrochlorothiazide 12.5 mg daily.  Which helps with edema as well. - hydrochlorothiazide (MICROZIDE) 12.5 MG capsule; Take 1 capsule (12.5 mg total) by mouth daily.  Dispense: 90 capsule; Refill: 1  3. Gastroesophageal reflux disease without esophagitis Chronic.  Controlled.  Stable.  Continue omeprazole 40 mg daily. - omeprazole (PRILOSEC) 40 MG capsule; Take 1 capsule (40 mg total) by mouth daily.  Dispense: 90 capsule; Refill: 1  4. Anxiety Chronic.  Controlled.  Stable.  PHQ is  1 GAD score is 2.  Continue sertraline 50 mg once a day. - sertraline (ZOLOFT) 50 MG tablet; Take 1 tablet (50 mg total) by mouth daily.  Dispense: 90 tablet; Refill: 1  5. Reactive depression Chronic.  Controlled.  Stable.  GAD score is 2 PHQ is 1.  Continue sertraline 50 mg daily. - sertraline (ZOLOFT) 50 MG tablet; Take 1 tablet (50 mg total) by mouth daily.  Dispense: 90 tablet; Refill: 1  6. Primary insomnia .  Controlled.  Stable.  Difficulty with initiating sleep.  Will continue trazodone 50 mg 1/2-1 nightly. - traZODone (DESYREL) 50 MG tablet; Take 1 tablet (50 mg total)  by mouth at bedtime.  Dispense: 90 tablet; Refill: 1   Weight loss discussed with patient and patient has been given a 1200 to 1500-calorie dietary guidelines.  We briefly discussed medications and that the preference would be to see go to a weight loss clinic situation initially and after getting on dietary and exercise guidelines consider medication.  Elizabeth Sauer, MD

## 2023-03-13 ENCOUNTER — Ambulatory Visit
Admission: RE | Admit: 2023-03-13 | Discharge: 2023-03-13 | Disposition: A | Discharge status: Home / Self Care | Payer: Managed Care, Other (non HMO) | Source: Ambulatory Visit | Attending: Oncology | Admitting: Oncology

## 2023-03-13 DIAGNOSIS — Z1231 Encounter for screening mammogram for malignant neoplasm of breast: Secondary | ICD-10-CM | POA: Diagnosis not present

## 2023-03-13 DIAGNOSIS — Z853 Personal history of malignant neoplasm of breast: Secondary | ICD-10-CM | POA: Insufficient documentation

## 2023-03-13 DIAGNOSIS — Z08 Encounter for follow-up examination after completed treatment for malignant neoplasm: Secondary | ICD-10-CM | POA: Insufficient documentation

## 2023-03-18 ENCOUNTER — Telehealth: Payer: Managed Care, Other (non HMO) | Admitting: Nurse Practitioner

## 2023-03-18 DIAGNOSIS — J069 Acute upper respiratory infection, unspecified: Secondary | ICD-10-CM | POA: Diagnosis not present

## 2023-03-18 MED ORDER — BENZONATATE 100 MG PO CAPS: 100.0000 mg | ORAL_CAPSULE | Freq: Three times a day (TID) | ORAL | 0 refills | Status: DC | PRN

## 2023-03-18 MED ORDER — FLUTICASONE PROPIONATE 50 MCG/ACT NA SUSP: 2.0000 | Freq: Every day | NASAL | 6 refills | Status: DC

## 2023-03-18 MED ORDER — AZITHROMYCIN 250 MG PO TABS: ORAL_TABLET | ORAL | 0 refills | Status: DC

## 2023-03-18 NOTE — Progress Notes (Signed)

## 2023-05-22 ENCOUNTER — Encounter: Payer: Self-pay | Admitting: Oncology

## 2023-05-23 NOTE — Telephone Encounter (Signed)
Tiffany Humphrey - Tiffany Humphrey

## 2023-05-24 ENCOUNTER — Other Ambulatory Visit: Payer: Self-pay

## 2023-05-24 ENCOUNTER — Inpatient Hospital Stay: Payer: Managed Care, Other (non HMO) | Attending: Hospice and Palliative Medicine | Admitting: Nurse Practitioner

## 2023-05-24 ENCOUNTER — Ambulatory Visit
Admission: RE | Admit: 2023-05-24 | Discharge: 2023-05-24 | Disposition: A | Discharge status: Home / Self Care | Payer: Managed Care, Other (non HMO) | Source: Ambulatory Visit | Attending: Nurse Practitioner

## 2023-05-24 ENCOUNTER — Ambulatory Visit
Admission: RE | Admit: 2023-05-24 | Discharge: 2023-05-24 | Disposition: A | Discharge status: Home / Self Care | Payer: Managed Care, Other (non HMO) | Source: Ambulatory Visit | Attending: Nurse Practitioner | Admitting: Nurse Practitioner

## 2023-05-24 ENCOUNTER — Other Ambulatory Visit: Payer: Self-pay | Admitting: Nurse Practitioner

## 2023-05-24 ENCOUNTER — Encounter: Payer: Self-pay | Admitting: Nurse Practitioner

## 2023-05-24 VITALS — BP 116/75 | HR 88 | Temp 99.0°F | Wt 176.0 lb

## 2023-05-24 DIAGNOSIS — N644 Mastodynia: Secondary | ICD-10-CM | POA: Insufficient documentation

## 2023-05-24 DIAGNOSIS — Z803 Family history of malignant neoplasm of breast: Secondary | ICD-10-CM | POA: Diagnosis not present

## 2023-05-24 DIAGNOSIS — C50412 Malignant neoplasm of upper-outer quadrant of left female breast: Secondary | ICD-10-CM | POA: Diagnosis not present

## 2023-05-24 DIAGNOSIS — Z87891 Personal history of nicotine dependence: Secondary | ICD-10-CM | POA: Insufficient documentation

## 2023-05-24 DIAGNOSIS — Z79810 Long term (current) use of selective estrogen receptor modulators (SERMs): Secondary | ICD-10-CM | POA: Insufficient documentation

## 2023-05-24 DIAGNOSIS — Z170 Estrogen receptor positive status [ER+]: Secondary | ICD-10-CM | POA: Insufficient documentation

## 2023-05-24 DIAGNOSIS — Z801 Family history of malignant neoplasm of trachea, bronchus and lung: Secondary | ICD-10-CM | POA: Diagnosis not present

## 2023-05-24 DIAGNOSIS — Z853 Personal history of malignant neoplasm of breast: Secondary | ICD-10-CM

## 2023-05-24 MED ORDER — TRAMADOL HCL 50 MG PO TABS: 50.0000 mg | ORAL_TABLET | Freq: Four times a day (QID) | ORAL | 0 refills | Status: AC | PRN

## 2023-05-24 MED ORDER — AMOXICILLIN-POT CLAVULANATE 875-125 MG PO TABS: 1.0000 | ORAL_TABLET | Freq: Two times a day (BID) | ORAL | 0 refills | Status: DC

## 2023-05-24 NOTE — Progress Notes (Signed)
Symptom Management Clinic  [facility] at [facility] A Department of the [place]. [facility] [address] [place], [place] [address] [phone] (phone) [phone] (fax)  Patient Care Team: Duanne Limerick, MD as PCP - General (Family Medicine)   Name of the patient: Tiffany Humphrey  [id number]  1970-09-07   Date of visit: 05/24/23  Diagnosis- left breast cancer  Chief complaint/ Reason for visit- left breast pain & swelling  Heme/Onc history:  Oncology History  Breast cancer (HCC)  06/18/2018 Initial Diagnosis   Breast cancer (HCC)   06/29/2018 Cancer Staging   Staging form: Breast, AJCC 8th Edition - Pathologic stage from 06/29/2018: Stage IA (pT1a, pN0, cM0, G2, ER+, PR+, HER2-) - Signed by Creig Hines, MD on 07/02/2018     Interval history- Patient is 52 year old female, on tamoxifen for history of breast cancer, s/p lumpectomy and radiation in 2019, who presents to symptom management clinic for complaints of left breast pain. Pain started Saturday and has worsened since onset. Radiates from her lumpectomy scar to shoulder and axilla. She feels area appears swollen. No fevers or chills. Sleep in interrupted d/t pain. She has reduced range of motion due to pain.    Review of systems- Review of Systems  Constitutional:  Negative for chills, fever, malaise/fatigue and weight loss.  Eyes: Negative.   Respiratory: Negative.  Negative for cough, sputum production and shortness of breath.   Cardiovascular:  Negative for chest pain, palpitations, orthopnea, claudication and leg swelling.  Gastrointestinal:  Negative for abdominal pain, blood in stool, constipation, diarrhea, heartburn, nausea and vomiting.  Genitourinary: Negative.   Musculoskeletal:  Positive for neck pain. Negative for back pain, joint pain and myalgias.  Skin: Negative.   Neurological:  Negative for dizziness, tingling, weakness and headaches.   Endo/Heme/Allergies: Negative.   Psychiatric/Behavioral: Negative.  Negative for depression. The patient is not nervous/anxious.       Allergies  Allergen Reactions   Quinolones Hives   Clindamycin Hives   Codeine Other (See Comments) and Palpitations    tachycardia    Past Medical History:  Diagnosis Date   Anxiety    a.) on BZO (alprazolam) PRN   B12 deficiency    Complication of anesthesia    a.) PONV; b.) preopertive anxiety/panic attacks   GERD (gastroesophageal reflux disease)    Headache    migraines/stress, optical migraines   History of kidney stones    History of left breast cancer 06/2018   a.) stage I invasive mammary carcinoma (ER/PR + , HER2.neu -); s/p lumpectomy + adjuvant XRT + endocrine therapy (tamoxifen)   Hypertension    Multilevel degenerative disc disease    Personal history of radiation therapy    PONV (postoperative nausea and vomiting)    Sleep difficulties    a.) on trazodone PRN   Vertigo    last episode 10/2016-NO PROBLEMS SINCE SINUS SURGERY IN 2018   Wears contact lenses     Past Surgical History:  Procedure Laterality Date   BREAST BIOPSY Right 06/08/2016   CYSTIC PAPILLARY APOCRINE METAPLASIA.    BREAST BIOPSY Left 06/06/2018   left breast stereo X clip positive   BREAST EXCISIONAL BIOPSY Left 06/22/2018   lumpectomy with sn and nl   BREAST EXCISIONAL BIOPSY Left 05/31/2021   removal of fat necrosis due to pain   BREAST LUMPECTOMY Left 06/22/2018   COLONOSCOPY  03/28/2022   CYSTOSTOMY Right 05/10/2019   Procedure: RIGHT OVARIAN  CYSTOSTOMY;  Surgeon: Schermerhorn, Ihor Austin, MD;  Location: [facility];  Service: Gynecology;  Laterality: Right;   FRONTAL SINUS EXPLORATION Bilateral 12/22/2016   Procedure: FRONTAL SINUS EXPLORATION;  Surgeon: Vernie Murders, MD;  Location: [facility];  Service: ENT;  Laterality: Bilateral;   IMAGE GUIDED SINUS SURGERY N/A 12/22/2016   Procedure: IMAGE GUIDED SINUS SURGERY;  Surgeon: Vernie Murders, MD;  Location: [facility];  Service: ENT;  Laterality: N/A;   KNEE ARTHROSCOPY Right 01/13/2023   Procedure: Right knee diagnostic arthroscopy, partial synovectomy, and/or chondroplasty;  Surgeon: Signa Kell, MD;  Location: [facility];  Service: Orthopedics;  Laterality: Right;   LAPAROSCOPIC TUBAL LIGATION Bilateral 05/10/2019   Procedure: LAPAROSCOPIC TUBAL LIGATION, falope rings Right fallope ring  Left tubal cautery;  Surgeon: Schermerhorn, Ihor Austin, MD;  Location: [facility];  Service: Gynecology;  Laterality: Bilateral;   MASS EXCISION Left 05/31/2021   Procedure: EXCISION MASS (scar);  Surgeon: Carolan Shiver, MD;  Location: [facility];  Service: General;  Laterality: Left;   MAXILLARY ANTROSTOMY Bilateral 12/22/2016   Procedure: MAXILLARY ANTROSTOMY;  Surgeon: Vernie Murders, MD;  Location: [facility];  Service: ENT;  Laterality: Bilateral;   PARTIAL MASTECTOMY WITH NEEDLE LOCALIZATION Left 06/22/2018   Procedure: PARTIAL MASTECTOMY WITH NEEDLE LOCALIZATION;  Surgeon: Carolan Shiver, MD;  Location: [facility];  Service: General;  Laterality: Left;   SENTINEL NODE BIOPSY Left 06/22/2018   Procedure: SENTINEL NODE BIOPSY;  Surgeon: Carolan Shiver, MD;  Location: [facility];  Service: General;  Laterality: Left;   SEPTOPLASTY N/A 12/22/2016   Procedure: SEPTOPLASTY;  Surgeon: Vernie Murders, MD;  Location: [facility];  Service: ENT;  Laterality: N/A;  GAVE DISK TO CECE 4-5 KP   TURBINATE REDUCTION N/A 12/22/2016   Procedure: TURBINATE REDUCTION partial inferior;  Surgeon: Vernie Murders, MD;  Location: [facility];  Service: ENT;  Laterality: N/A;    Social History   Socioeconomic History   Marital status: Married    Spouse name: Therapist, nutritional   Number of children: 1   Years of education: Not on file   Highest education level: Associate degree: academic program  Occupational History   Not on file  Tobacco Use   Smoking status: Former     Current packs/day: 0.00    Average packs/day: 1 pack/day for 15.0 years (15.0 ttl pk-yrs)    Types: Cigarettes    Start date: 51    Quit date: 2013    Years since quitting: 11.7   Smokeless tobacco: Never  Vaping Use   Vaping status: Former  Substance and Sexual Activity   Alcohol use: Yes    Alcohol/week: 2.0 standard drinks of alcohol    Types: 1 Glasses of wine, 1 Cans of beer per week    Comment: social (4-6 drinks/month)   Drug use: Never   Sexual activity: Yes  Other Topics Concern   Not on file  Social History Narrative   Lives at home with hubby   Social Determinants of Health   Financial Resource Strain: Patient Declined (03/07/2023)   Overall Financial Resource Strain (CARDIA)    Difficulty of Paying Living Expenses: Patient declined  Food Insecurity: Patient Declined (03/07/2023)   Hunger Vital Sign    Worried About Running Out of Food in the Last Year: Patient declined    Ran Out of Food in the Last Year: Patient declined  Transportation Needs: No Transportation Needs (03/07/2023)   PRAPARE - Administrator, Civil Service (Medical): No  Lack of Transportation (Non-Medical): No  Physical Activity: Unknown (03/07/2023)   Exercise Vital Sign    Days of Exercise per Week: 4 days    Minutes of Exercise per Session: Patient declined  Stress: No Stress Concern Present (03/07/2023)   [employer] of Occupational Health - Occupational Stress Questionnaire    Feeling of Stress : Not at all  Social Connections: Moderately Integrated (03/07/2023)   Social Connection and Isolation Panel [NHANES]    Frequency of Communication with Friends and Family: More than three times a week    Frequency of Social Gatherings with Friends and Family: Twice a week    Attends Religious Services: 1 to 4 times per year    Active Member of [employer] or Organizations: No    Attends Engineer, structural: Not on file    Marital Status: Married  Catering manager Violence: Not on  file    Family History  Problem Relation Age of Onset   Breast cancer Mother        dx 24s; 2nd primary at 80   Breast cancer Paternal Grandmother        dx 50s; deceased 34s   Breast cancer Paternal Aunt        dx 65s; currently 91s   Breast cancer Cousin        dx 61s; currently late 48s; daughter of a paternal uncle   Lung cancer Paternal Uncle        smoker; deceased     Current Outpatient Medications:    acetaminophen (TYLENOL) 500 MG tablet, Take 2 tablets (1,000 mg total) by mouth every 8 (eight) hours., Disp: 90 tablet, Rfl: 2   ALPRAZolam (XANAX) 0.25 MG tablet, Take 1 tablet (0.25 mg total) by mouth daily as needed for anxiety., Disp: 30 tablet, Rfl: 0   benzonatate (TESSALON PERLES) 100 MG capsule, Take 1 capsule (100 mg total) by mouth 3 (three) times daily as needed for cough., Disp: 20 capsule, Rfl: 0   calcium-vitamin D (OSCAL WITH D) 500-200 MG-UNIT tablet, Take 1 tablet by mouth daily., Disp: , Rfl:    Cyanocobalamin (B-12) 2500 MCG SUBL, Place 2,500 mcg under the tongue daily., Disp: , Rfl:    estradiol (ESTRACE) 0.1 MG/GM vaginal cream, Use 1/4 app per vagina 1-2 times weekly, Disp: , Rfl:    fluticasone (FLONASE) 50 MCG/ACT nasal spray, Place 2 sprays into both nostrils daily., Disp: 16 g, Rfl: 6   hydrochlorothiazide (MICROZIDE) 12.5 MG capsule, Take 1 capsule (12.5 mg total) by mouth daily., Disp: 90 capsule, Rfl: 1   ibuprofen (ADVIL) 800 MG tablet, Take 800 mg by mouth every 8 (eight) hours as needed for headache., Disp: , Rfl:    levocetirizine (XYZAL) 5 MG tablet, Take 5 mg by mouth daily. , Disp: , Rfl:    meclizine (ANTIVERT) 25 MG tablet, Take 25 mg by mouth 3 (three) times daily as needed for dizziness., Disp: , Rfl:    Multiple Vitamin (MULTIVITAMIN WITH MINERALS) TABS tablet, Take 1 tablet by mouth daily., Disp: , Rfl:    NON FORMULARY, Place 3 drops under the tongue at bedtime. Allergy Oral Drops made by Allergy Clinic, Disp: , Rfl:    omeprazole  (PRILOSEC) 40 MG capsule, Take 1 capsule (40 mg total) by mouth daily., Disp: 90 capsule, Rfl: 1   sertraline (ZOLOFT) 50 MG tablet, Take 1 tablet (50 mg total) by mouth daily., Disp: 90 tablet, Rfl: 1   tamoxifen (NOLVADEX) 20 MG tablet, Take 1 tablet (  20 mg total) by mouth daily., Disp: 90 tablet, Rfl: 2   traZODone (DESYREL) 50 MG tablet, Take 1 tablet (50 mg total) by mouth at bedtime., Disp: 90 tablet, Rfl: 1   triamcinolone (NASACORT) 55 MCG/ACT AERO nasal inhaler, Place 1 spray into the nose daily., Disp: , Rfl:    azithromycin (ZITHROMAX Z-PAK) 250 MG tablet, As directed, Disp: 6 tablet, Rfl: 0   traMADol (ULTRAM) 50 MG tablet, Take 50 mg by mouth every 6 (six) hours as needed (pain). (Patient not taking: Reported on 03/07/2023), Disp: , Rfl:   Physical exam:  Vitals:   05/24/23 1252  BP: 116/75  Pulse: 88  Temp: 99 F (37.2 C)  TempSrc: Tympanic  SpO2: 100%  Weight: 176 lb (79.8 kg)   Physical Exam Constitutional:      Appearance: She is ill-appearing.  Pulmonary:     Effort: No respiratory distress.  Chest:  Breasts:    Left: Tenderness present. No inverted nipple, nipple discharge or skin change.     Comments: Breast exam was performed in seated and lying down position. Left breast has slight fullness without erythema of upper outer quadrant. Tenderness of the left breast, worse below lumpectomy scar with possible fluid pocket. Exam limited d/t tenderness. Pain extends into axilla and shoulder. No axillary adenopathy.  Lymphadenopathy:     Upper Body:     Left upper body: No supraclavicular or axillary adenopathy.  Neurological:     Mental Status: She is alert.         Latest Ref Rng & Units 01/13/2023   11:33 AM  CMP  Glucose 70 - 99 mg/dL 80   BUN 6 - 20 mg/dL 6   Creatinine 1.61 - 0.96 mg/dL 0.45   Sodium 409 - 811 mmol/L 136   Potassium 3.5 - 5.1 mmol/L 5.3   Chloride 98 - 111 mmol/L 110       Latest Ref Rng & Units 01/13/2023   11:33 AM  CBC  Hemoglobin  12.0 - 15.0 g/dL 91.4   Hematocrit 78.2 - 46.0 % 40.0    No results found.  Assessment and plan- Patient is a 52 y.o. female   Left breast pain- swelling of upper outer quadrant, tenderness, worse at site of lumpectomy. Possible palpable fluid pocket below lumpectomy scar. Seroma? Recommend NSAIDs/ibuprofen with tylenol for pain. Start augmentin for possible bacterial component. Will also send refill of tramadol for pain. Recommend diagnostic left mammogram with ultrasound to evaluate further. If negative, recommend evaluation with lymphedema specialist.  Stage I ER positive left breast cancer - invasive mammary carcinoma of the left breast, ER/PR positive, HER2 negative s/p surgery, adjuvant radiation, currently on tamoxifen. She completes 5 years of endocrine therapy in February 2025.    Disposition:  Mammogram & ultrasound Follow up based on results. RTC sooner if symptoms don't improve or worsen in interim.    Visit Diagnosis 1. Breast pain, left   2. History of left breast cancer     Patient expressed understanding and was in agreement with this plan. She also understands that She can call clinic at any time with any questions, concerns, or complaints.   Thank you for allowing me to participate in the care of this very pleasant patient.   Consuello Masse, DNP, AGNP-C, AOCNP [facility] Center at [facility] [phone]  CC:

## 2023-05-31 ENCOUNTER — Telehealth: Payer: Self-pay | Admitting: Nurse Practitioner

## 2023-05-31 DIAGNOSIS — N644 Mastodynia: Secondary | ICD-10-CM

## 2023-05-31 NOTE — Telephone Encounter (Signed)
Called to check on patient's left breast pain. Reviewed that mammogram and ultrasound were negative for new masses. No improvement with antibiotics. Suspect symptomatic lymphedema. She has restarted her exercises and declines evaluation with lymphedema specialist. Reviewed that if symptoms worsen, I'd like to reevaluate her. She's in agreement.

## 2023-06-13 ENCOUNTER — Encounter: Payer: Self-pay | Admitting: Plastic Surgery

## 2023-06-13 ENCOUNTER — Ambulatory Visit (INDEPENDENT_AMBULATORY_CARE_PROVIDER_SITE_OTHER): Payer: Managed Care, Other (non HMO) | Admitting: Plastic Surgery

## 2023-06-13 VITALS — BP 133/82 | HR 81 | Ht 65.0 in | Wt 165.0 lb

## 2023-06-13 DIAGNOSIS — N6489 Other specified disorders of breast: Secondary | ICD-10-CM | POA: Insufficient documentation

## 2023-06-13 DIAGNOSIS — C50412 Malignant neoplasm of upper-outer quadrant of left female breast: Secondary | ICD-10-CM

## 2023-06-13 DIAGNOSIS — Z170 Estrogen receptor positive status [ER+]: Secondary | ICD-10-CM

## 2023-06-13 DIAGNOSIS — N651 Disproportion of reconstructed breast: Secondary | ICD-10-CM

## 2023-06-13 NOTE — Progress Notes (Signed)
Patient ID: Tiffany Humphrey, female    DOB: 08/25/1971, 52 y.o.   MRN: [id number]   Chief Complaint  Patient presents with   Consult    The patient is a 52 year old female here for evaluation of her breasts.  She was seen by Dr. Hazle Quant in 2019 with distortion of the left breast mammogram.  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  October 2019 she underwent left needle localized breast partial mastectomy with sentinel lymph node biopsy.  In October 2022 she was seen again and underwent excision of painful left breast scar.  She was a smoker but quit about 10 years ago.  She has significant asymmetry which is difficult to find a bra as a result.  Originally she was asking for the radiated breast to be made larger.  We discussed the risks and complications of working on the radiated breast.  I am very concerned that doing a lift on the left radiated breast will put her at great risk of loss of nipple areolar complex.  Also has an indentation on the left lateral breast from the biopsy.  It was made worse by the radiation.  The patient states this is very uncomfortable and sometimes even painful.  She feels like it has a pulling sensation.  The biggest issue is the asymmetry and not fitting into her close properly.     Review of Systems  Constitutional:  Positive for activity change. Negative for appetite change.  HENT: Negative.    Eyes: Negative.   Respiratory: Negative.    Cardiovascular: Negative.   Gastrointestinal: Negative.   Endocrine: Negative.   Genitourinary: Negative.   Musculoskeletal: Negative.     Past Medical History:  Diagnosis Date   Anxiety    a.) on BZO (alprazolam) PRN   B12 deficiency    Complication of anesthesia    a.) PONV; b.) preopertive anxiety/panic attacks   GERD (gastroesophageal reflux disease)    Headache    migraines/stress, optical migraines   History of kidney stones    History of left breast cancer 06/2018   a.) stage I  invasive mammary carcinoma (ER/PR + , HER2.neu -); s/p lumpectomy + adjuvant XRT + endocrine therapy (tamoxifen)   Hypertension    Multilevel degenerative disc disease    Personal history of radiation therapy    PONV (postoperative nausea and vomiting)    Sleep difficulties    a.) on trazodone PRN   Vertigo    last episode 10/2016-NO PROBLEMS SINCE SINUS SURGERY IN 2018   Wears contact lenses     Past Surgical History:  Procedure Laterality Date   BREAST BIOPSY Right 06/08/2016   CYSTIC PAPILLARY APOCRINE METAPLASIA.    BREAST BIOPSY Left 06/06/2018   left breast stereo X clip positive   BREAST EXCISIONAL BIOPSY Left 06/22/2018   lumpectomy with sn and nl   BREAST EXCISIONAL BIOPSY Left 05/31/2021   removal of fat necrosis due to pain   BREAST LUMPECTOMY Left 06/22/2018   COLONOSCOPY  03/28/2022   CYSTOSTOMY Right 05/10/2019   Procedure: RIGHT OVARIAN CYSTOSTOMY;  Surgeon: Feliberto Gottron Ihor Austin, MD;  Location: [facility];  Service: Gynecology;  Laterality: Right;   FRONTAL SINUS EXPLORATION Bilateral 12/22/2016   Procedure: FRONTAL SINUS EXPLORATION;  Surgeon: Vernie Murders, MD;  Location: [facility];  Service: ENT;  Laterality: Bilateral;   IMAGE GUIDED SINUS SURGERY N/A 12/22/2016   Procedure: IMAGE GUIDED SINUS SURGERY;  Surgeon: Vernie Murders, MD;  Location: [facility];  Service: ENT;  Laterality: N/A;   KNEE ARTHROSCOPY Right 01/13/2023   Procedure: Right knee diagnostic arthroscopy, partial synovectomy, and/or chondroplasty;  Surgeon: Signa Kell, MD;  Location: [facility];  Service: Orthopedics;  Laterality: Right;   LAPAROSCOPIC TUBAL LIGATION Bilateral 05/10/2019   Procedure: LAPAROSCOPIC TUBAL LIGATION, falope rings Right fallope ring  Left tubal cautery;  Surgeon: Schermerhorn, Ihor Austin, MD;  Location: [facility];  Service: Gynecology;  Laterality: Bilateral;   MASS EXCISION Left 05/31/2021   Procedure: EXCISION MASS (scar);  Surgeon: Carolan Shiver, MD;   Location: [facility];  Service: General;  Laterality: Left;   MAXILLARY ANTROSTOMY Bilateral 12/22/2016   Procedure: MAXILLARY ANTROSTOMY;  Surgeon: Vernie Murders, MD;  Location: [facility];  Service: ENT;  Laterality: Bilateral;   PARTIAL MASTECTOMY WITH NEEDLE LOCALIZATION Left 06/22/2018   Procedure: PARTIAL MASTECTOMY WITH NEEDLE LOCALIZATION;  Surgeon: Carolan Shiver, MD;  Location: [facility];  Service: General;  Laterality: Left;   SENTINEL NODE BIOPSY Left 06/22/2018   Procedure: SENTINEL NODE BIOPSY;  Surgeon: Carolan Shiver, MD;  Location: [facility];  Service: General;  Laterality: Left;   SEPTOPLASTY N/A 12/22/2016   Procedure: SEPTOPLASTY;  Surgeon: Vernie Murders, MD;  Location: [facility];  Service: ENT;  Laterality: N/A;  GAVE DISK TO CECE 4-5 KP   TURBINATE REDUCTION N/A 12/22/2016   Procedure: TURBINATE REDUCTION partial inferior;  Surgeon: Vernie Murders, MD;  Location: [facility];  Service: ENT;  Laterality: N/A;      Current Outpatient Medications:    acetaminophen (TYLENOL) 500 MG tablet, Take 2 tablets (1,000 mg total) by mouth every 8 (eight) hours., Disp: 90 tablet, Rfl: 2   ALPRAZolam (XANAX) 0.25 MG tablet, Take 1 tablet (0.25 mg total) by mouth daily as needed for anxiety., Disp: 30 tablet, Rfl: 0   calcium-vitamin D (OSCAL WITH D) 500-200 MG-UNIT tablet, Take 1 tablet by mouth daily., Disp: , Rfl:    Cyanocobalamin (B-12) 2500 MCG SUBL, Place 2,500 mcg under the tongue daily., Disp: , Rfl:    fluticasone (FLONASE) 50 MCG/ACT nasal spray, Place 2 sprays into both nostrils daily., Disp: 16 g, Rfl: 6   hydrochlorothiazide (MICROZIDE) 12.5 MG capsule, Take 1 capsule (12.5 mg total) by mouth daily., Disp: 90 capsule, Rfl: 1   ibuprofen (ADVIL) 800 MG tablet, Take 800 mg by mouth every 8 (eight) hours as needed for headache., Disp: , Rfl:    levocetirizine (XYZAL) 5 MG tablet, Take 5 mg by mouth daily. , Disp: , Rfl:    meclizine (ANTIVERT)  25 MG tablet, Take 25 mg by mouth 3 (three) times daily as needed for dizziness., Disp: , Rfl:    Multiple Vitamin (MULTIVITAMIN WITH MINERALS) TABS tablet, Take 1 tablet by mouth daily., Disp: , Rfl:    NON FORMULARY, Place 3 drops under the tongue at bedtime. Allergy Oral Drops made by [facility], Disp: , Rfl:    omeprazole (PRILOSEC) 40 MG capsule, Take 1 capsule (40 mg total) by mouth daily., Disp: 90 capsule, Rfl: 1   tamoxifen (NOLVADEX) 20 MG tablet, Take 1 tablet (20 mg total) by mouth daily., Disp: 90 tablet, Rfl: 2   traMADol (ULTRAM) 50 MG tablet, Take 1 tablet (50 mg total) by mouth every 6 (six) hours as needed (breast pain)., Disp: 30 tablet, Rfl: 0   traZODone (DESYREL) 50 MG tablet, Take 1 tablet (50 mg total) by mouth at bedtime., Disp: 90 tablet, Rfl: 1   triamcinolone (NASACORT) 55 MCG/ACT AERO nasal  inhaler, Place 1 spray into the nose daily., Disp: , Rfl:    amoxicillin-clavulanate (AUGMENTIN) 875-125 MG tablet, Take 1 tablet by mouth 2 (two) times daily., Disp: 14 tablet, Rfl: 0   azithromycin (ZITHROMAX Z-PAK) 250 MG tablet, As directed, Disp: 6 tablet, Rfl: 0   benzonatate (TESSALON PERLES) 100 MG capsule, Take 1 capsule (100 mg total) by mouth 3 (three) times daily as needed for cough., Disp: 20 capsule, Rfl: 0   estradiol (ESTRACE) 0.1 MG/GM vaginal cream, Use 1/4 app per vagina 1-2 times weekly, Disp: , Rfl:    sertraline (ZOLOFT) 50 MG tablet, Take 1 tablet (50 mg total) by mouth daily., Disp: 90 tablet, Rfl: 1   Objective:   Vitals:   06/13/23 1110  BP: 133/82  Pulse: 81  SpO2: 97%    Physical Exam Vitals and nursing note reviewed.  Constitutional:      Appearance: Normal appearance.  Cardiovascular:     Rate and Rhythm: Normal rate.     Pulses: Normal pulses.  Pulmonary:     Effort: Pulmonary effort is normal.  Abdominal:     General: There is no distension.     Palpations: Abdomen is soft.     Tenderness: There is no abdominal tenderness.   Musculoskeletal:        General: No swelling or deformity.  Skin:    General: Skin is warm.     Capillary Refill: Capillary refill takes less than 2 seconds.  Neurological:     Mental Status: She is alert and oriented to person, place, and time.  Psychiatric:        Mood and Affect: Mood normal.        Behavior: Behavior normal.        Thought Content: Thought content normal.        Judgment: Judgment normal.     Assessment & Plan:  Malignant neoplasm of upper-outer quadrant of left breast in female, estrogen receptor positive (HCC)  Postoperative breast asymmetry  The patient does want to stay safe.  The safest option will be for a right mastopexy reduction for symmetry with fat grafting of the left breast for the scar contracture. Pictures were obtained of the patient and placed in the chart with the patient's or guardian's permission.  [name] Styles Fambro, DO

## 2023-07-21 ENCOUNTER — Ambulatory Visit (INDEPENDENT_AMBULATORY_CARE_PROVIDER_SITE_OTHER): Payer: Managed Care, Other (non HMO) | Admitting: Physician Assistant

## 2023-07-21 DIAGNOSIS — C50412 Malignant neoplasm of upper-outer quadrant of left female breast: Secondary | ICD-10-CM

## 2023-07-21 DIAGNOSIS — Z170 Estrogen receptor positive status [ER+]: Secondary | ICD-10-CM

## 2023-07-21 MED ORDER — CEPHALEXIN 500 MG PO CAPS: 500.0000 mg | ORAL_CAPSULE | Freq: Four times a day (QID) | ORAL | 0 refills | Status: AC

## 2023-07-21 MED ORDER — OXYCODONE HCL 5 MG PO TABS: 5.0000 mg | ORAL_TABLET | Freq: Three times a day (TID) | ORAL | 0 refills | Status: AC | PRN

## 2023-07-21 MED ORDER — ONDANSETRON 4 MG PO TBDP: 4.0000 mg | ORAL_TABLET | Freq: Three times a day (TID) | ORAL | 0 refills | Status: DC | PRN

## 2023-07-21 NOTE — Progress Notes (Signed)
Patient ID: Tiffany Humphrey, female    DOB: [birth year], 52 y.o.   MRN: [id number]  Chief Complaint  Patient presents with   Pre-op Exam      ICD-10-CM   1. Malignant neoplasm of upper-outer quadrant of left breast in female, estrogen receptor positive (HCC)  C50.412    Z17.0        History of Present Illness: Tiffany Humphrey is a 52 y.o.  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  She presents for preoperative evaluation for upcoming procedure, left breast fat grafting and right breast mastopexy/reduction for symmetry, scheduled for 08/02/2023 with Dr. Ulice Bold.  The patient has not had problems with anesthesia, however she admits that she becomes particularly anxious and has experienced panic attacks in the past.  She asks that she is fully sedated before we stop her down on the bed.  Will have her discussed with anesthesia on day of surgery.  She endorses a 14-pack-year smoking history, but quit over 10 years ago.  She denies any COPD or other pulmonary disease.  Denies any cardiac disease, previous or current anticoagulation, stroke, or diabetes.  She denies any personal or family history of blood clots or clotting disorder.  She does endorse lower extremity varicosities.  She sees Dr. Smith Robert for oncology and reports that she is still taking tamoxifen.  Informed patient that we typically have patients hold the tamoxifen 2 weeks before and after surgery, will send clearance to the office of Dr. Smith Robert.  Discussed plans for surgery and patient is understanding agreeable.  Summary of Previous Visit: She met with Dr. Ulice Bold for consult 06/13/2023.  At that time, endorsed significant asymmetry following left breast cancer and subsequent surgeries as well as radiation.  She initially wanted the left breast to be slightly larger to match the right breast, but understands the risks of attempting that surgery particularly given her  history of radiation.  She also reported indentation on left lateral breast from biopsy that has been made worse with radiation and is uncomfortable.  Discussed options for surgery.  The safest would be for a right sided reduction/mastopexy for symmetry and left breast scar release and fat grafting.  This is something that patient was agreeable to.  Discussed expectations and patient is agreeable to proceed.  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  Recommended some time off from work postoperatively for recovery.  PMH Significant for: Left breast cancer s/p partial mastectomy/lumpectomy 2019 and subsequent radiation, left breast scar revision 05/2021, postoperative breast asymmetry, HTN, GERD, anxiety.   Past Medical History: Allergies: Allergies  Allergen Reactions   Quinolones Hives   Clindamycin Hives   Codeine Other (See Comments) and Palpitations    tachycardia    Current Medications:  Current Outpatient Medications:    acetaminophen (TYLENOL) 500 MG tablet, Take 2 tablets (1,000 mg total) by mouth every 8 (eight) hours., Disp: 90 tablet, Rfl: 2   ALPRAZolam (XANAX) 0.25 MG tablet, Take 1 tablet (0.25 mg total) by mouth daily as needed for anxiety., Disp: 30 tablet, Rfl: 0   calcium-vitamin D (OSCAL WITH D) 500-200 MG-UNIT tablet, Take 1 tablet by mouth daily., Disp: , Rfl:    cephALEXin (KEFLEX) 500 MG capsule, Take 1 capsule (500 mg total) by mouth 4 (four) times daily for 3 days., Disp: 12 capsule, Rfl: 0   Cyanocobalamin (B-12) 2500 MCG SUBL, Place 2,500 mcg under the tongue daily., Disp: ,  Rfl:    fluticasone (FLONASE) 50 MCG/ACT nasal spray, Place 2 sprays into both nostrils daily., Disp: 16 g, Rfl: 6   hydrochlorothiazide (MICROZIDE) 12.5 MG capsule, Take 1 capsule (12.5 mg total) by mouth daily., Disp: 90 capsule, Rfl: 1   ibuprofen (ADVIL) 800 MG tablet, Take 800 mg by mouth every 8 (eight) hours as needed for headache., Disp: , Rfl:    levocetirizine (XYZAL) 5 MG  tablet, Take 5 mg by mouth daily. , Disp: , Rfl:    meclizine (ANTIVERT) 25 MG tablet, Take 25 mg by mouth 3 (three) times daily as needed for dizziness., Disp: , Rfl:    Multiple Vitamin (MULTIVITAMIN WITH MINERALS) TABS tablet, Take 1 tablet by mouth daily., Disp: , Rfl:    omeprazole (PRILOSEC) 40 MG capsule, Take 1 capsule (40 mg total) by mouth daily., Disp: 90 capsule, Rfl: 1   ondansetron (ZOFRAN-ODT) 4 MG disintegrating tablet, Take 1 tablet (4 mg total) by mouth every 8 (eight) hours as needed for nausea or vomiting., Disp: 9 tablet, Rfl: 0   oxyCODONE (ROXICODONE) 5 MG immediate release tablet, Take 1 tablet (5 mg total) by mouth every 8 (eight) hours as needed for up to 7 days for severe pain (pain score 7-10)., Disp: 20 tablet, Rfl: 0   tamoxifen (NOLVADEX) 20 MG tablet, Take 1 tablet (20 mg total) by mouth daily., Disp: 90 tablet, Rfl: 2   traMADol (ULTRAM) 50 MG tablet, Take 1 tablet (50 mg total) by mouth every 6 (six) hours as needed (breast pain)., Disp: 30 tablet, Rfl: 0   traZODone (DESYREL) 50 MG tablet, Take 1 tablet (50 mg total) by mouth at bedtime., Disp: 90 tablet, Rfl: 1   triamcinolone (NASACORT) 55 MCG/ACT AERO nasal inhaler, Place 1 spray into the nose daily., Disp: , Rfl:   Past Medical Problems: Past Medical History:  Diagnosis Date   Anxiety    a.) on BZO (alprazolam) PRN   B12 deficiency    Complication of anesthesia    a.) PONV; b.) preopertive anxiety/panic attacks   GERD (gastroesophageal reflux disease)    Headache    migraines/stress, optical migraines   History of kidney stones    History of left breast cancer 06/2018   a.) stage I invasive mammary carcinoma (ER/PR + , HER2.neu -); s/p lumpectomy + adjuvant XRT + endocrine therapy (tamoxifen)   Hypertension    Multilevel degenerative disc disease    Personal history of radiation therapy    PONV (postoperative nausea and vomiting)    Sleep difficulties    a.) on trazodone PRN   Vertigo    last  episode 10/2016-NO PROBLEMS SINCE SINUS SURGERY IN 2018   Wears contact lenses     Past Surgical History: Past Surgical History:  Procedure Laterality Date   BREAST BIOPSY Right 06/08/2016   CYSTIC PAPILLARY APOCRINE METAPLASIA.    BREAST BIOPSY Left 06/06/2018   left breast stereo X clip positive   BREAST EXCISIONAL BIOPSY Left 06/22/2018   lumpectomy with sn and nl   BREAST EXCISIONAL BIOPSY Left 05/31/2021   removal of fat necrosis due to pain   BREAST LUMPECTOMY Left 06/22/2018   COLONOSCOPY  03/28/2022   CYSTOSTOMY Right 05/10/2019   Procedure: RIGHT OVARIAN CYSTOSTOMY;  Surgeon: Feliberto Gottron Ihor Austin, MD;  Location: [facility];  Service: Gynecology;  Laterality: Right;   FRONTAL SINUS EXPLORATION Bilateral 12/22/2016   Procedure: FRONTAL SINUS EXPLORATION;  Surgeon: Vernie Murders, MD;  Location: [facility];  Service: ENT;  Laterality:  Bilateral;   IMAGE GUIDED SINUS SURGERY N/A 12/22/2016   Procedure: IMAGE GUIDED SINUS SURGERY;  Surgeon: Vernie Murders, MD;  Location: [facility];  Service: ENT;  Laterality: N/A;   KNEE ARTHROSCOPY Right 01/13/2023   Procedure: Right knee diagnostic arthroscopy, partial synovectomy, and/or chondroplasty;  Surgeon: Signa Kell, MD;  Location: [facility];  Service: Orthopedics;  Laterality: Right;   LAPAROSCOPIC TUBAL LIGATION Bilateral 05/10/2019   Procedure: LAPAROSCOPIC TUBAL LIGATION, falope rings Right fallope ring  Left tubal cautery;  Surgeon: Schermerhorn, Ihor Austin, MD;  Location: [facility];  Service: Gynecology;  Laterality: Bilateral;   MASS EXCISION Left 05/31/2021   Procedure: EXCISION MASS (scar);  Surgeon: Carolan Shiver, MD;  Location: [facility];  Service: General;  Laterality: Left;   MAXILLARY ANTROSTOMY Bilateral 12/22/2016   Procedure: MAXILLARY ANTROSTOMY;  Surgeon: Vernie Murders, MD;  Location: [facility];  Service: ENT;  Laterality: Bilateral;   PARTIAL MASTECTOMY WITH NEEDLE LOCALIZATION Left  06/22/2018   Procedure: PARTIAL MASTECTOMY WITH NEEDLE LOCALIZATION;  Surgeon: Carolan Shiver, MD;  Location: [facility];  Service: General;  Laterality: Left;   SENTINEL NODE BIOPSY Left 06/22/2018   Procedure: SENTINEL NODE BIOPSY;  Surgeon: Carolan Shiver, MD;  Location: [facility];  Service: General;  Laterality: Left;   SEPTOPLASTY N/A 12/22/2016   Procedure: SEPTOPLASTY;  Surgeon: Vernie Murders, MD;  Location: [facility];  Service: ENT;  Laterality: N/A;  GAVE DISK TO CECE 4-5 KP   TURBINATE REDUCTION N/A 12/22/2016   Procedure: TURBINATE REDUCTION partial inferior;  Surgeon: Vernie Murders, MD;  Location: [facility];  Service: ENT;  Laterality: N/A;    Social History: Social History   Socioeconomic History   Marital status: Married    Spouse name: Therapist, nutritional   Number of children: 1   Years of education: Not on file   Highest education level: Associate degree: academic program  Occupational History   Not on file  Tobacco Use   Smoking status: Former    Current packs/day: 0.00    Average packs/day: 1 pack/day for 15.0 years (15.0 ttl pk-yrs)    Types: Cigarettes    Start date: 63    Quit date: 2013    Years since quitting: 11.8   Smokeless tobacco: Never  Vaping Use   Vaping status: Former  Substance and Sexual Activity   Alcohol use: Yes    Alcohol/week: 2.0 standard drinks of alcohol    Types: 1 Glasses of wine, 1 Cans of beer per week    Comment: social (4-6 drinks/month)   Drug use: Never   Sexual activity: Yes  Other Topics Concern   Not on file  Social History Narrative   Lives at home with hubby   Social Determinants of Health   Financial Resource Strain: Patient Declined (03/07/2023)   Overall Financial Resource Strain (CARDIA)    Difficulty of Paying Living Expenses: Patient declined  Food Insecurity: Patient Declined (03/07/2023)   Hunger Vital Sign    Worried About Running Out of Food in the Last Year: Patient declined    Ran Out  of Food in the Last Year: Patient declined  Transportation Needs: No Transportation Needs (03/07/2023)   PRAPARE - Administrator, Civil Service (Medical): No    Lack of Transportation (Non-Medical): No  Physical Activity: Unknown (03/07/2023)   Exercise Vital Sign    Days of Exercise per Week: 4 days    Minutes of Exercise per Session: Patient declined  Stress: No Stress Concern Present (  03/07/2023)   [place] [employer] - Occupational Stress Questionnaire    Feeling of Stress : Not at all  Social Connections: Moderately Integrated (03/07/2023)   Social Connection and Isolation Panel [NHANES]    Frequency of Communication with Friends and Family: More than three times a week    Frequency of Social Gatherings with Friends and Family: Twice a week    Attends Religious Services: 1 to 4 times per year    Active Member of [employer] or Organizations: No    Attends Engineer, structural: Not on file    Marital Status: Married  Catering manager Violence: Not on file    Family History: Family History  Problem Relation Age of Onset   Breast cancer Mother        dx 25s; 2nd primary at 26   Breast cancer Paternal Grandmother        dx 20s; deceased 72s   Breast cancer Paternal Aunt        dx 23s; currently 51s   Breast cancer Cousin        dx 16s; currently late 93s; daughter of a paternal uncle   Lung cancer Paternal Uncle        smoker; deceased    Review of Systems: ROS Denies any recent chest pain, difficulty breathing, leg swelling, fevers.  Physical Exam: Vital Signs LMP 05/25/2020 (Approximate)   Physical Exam Constitutional:      General: Not in acute distress.    Appearance: Normal appearance. Not ill-appearing.  HENT:     Head: Normocephalic and atraumatic.  Eyes:     Pupils: Pupils are equal, round. Cardiovascular:     Rate and Rhythm: Normal rate.    Pulses: Normal pulses.  Pulmonary:     Effort: No respiratory distress or  increased work of breathing.  Speaks in full sentences. Abdominal:     General: Abdomen is flat. No distension.   Musculoskeletal: Normal range of motion. No lower extremity swelling or edema.  Skin:    General: Skin is warm and dry.     Findings: No erythema or rash.  Neurological:     Mental Status: Alert and oriented to person, place, and time.  Psychiatric:        Mood and Affect: Mood normal.        Behavior: Behavior normal.    Assessment/Plan: The patient is scheduled for fat grafting left breast and right breast reduction/mastopexy for symmetry with Dr. Ulice Bold.  Risks, benefits, and alternatives of procedure discussed, questions answered and consent obtained.    Smoking Status: Former smoker, quit 10 years ago. Last Mammogram: 02/2023; Results: BI-RADS Category 2: Benign.  Caprini Score: 7; Risk Factors include: Age, BMI greater than 25, scattered varicosities, and length of planned surgery. Recommendation for mechanical and possibly pharmacological prophylaxis.  Will discuss with Dr. Ulice Bold and prescribe Lovenox if indicated, but otherwise encourage early ambulation.   Pictures obtained: 06/13/2023  Post-op Rx sent to pharmacy: Oxycodone, Zofran, Keflex.  Advised the patient to exercise caution if taking her trazodone or benzodiazepines in conjunction with her oxycodone given combined sedative effects.  Patient was provided with the General Surgical Risk consent document and Pain Medication Agreement prior to their appointment.  They had adequate time to read through the risk consent documents and Pain Medication Agreement. We also discussed them in person together during this preop appointment. All of their questions were answered to their satisfaction.  Recommended calling if they have any further questions.  Risk consent form and Pain Medication Agreement to be scanned into patient's chart.  The risks that can be encountered with and after liposuction were discussed and  include the following but no limited to these:  Asymmetry, fluid accumulation, firmness of the area, fat necrosis with death of fat tissue, bleeding, infection, delayed healing, anesthesia risks, skin sensation changes, injury to structures including nerves, blood vessels, and muscles which may be temporary or permanent, allergies to tape, suture materials and glues, blood products, topical preparations or injected agents, skin and contour irregularities, skin discoloration and swelling, deep vein thrombosis, cardiac and pulmonary complications, pain, which may persist, persistent pain, recurrence of the lesion, poor healing of the incision, possible need for revisional surgery or staged procedures. Thiere can also be persistent swelling, poor wound healing, rippling or loose skin, worsening of cellulite, swelling, and thermal burn or heat injury from ultrasound with the ultrasound-assisted lipoplasty technique. Any change in weight fluctuations can alter the outcome.  The risk that can be encountered with mastopexy/breast lift were discussed and include the following but not limited to these:  Breast asymmetry, fluid accumulation, firmness of the breast, inability to breast feed, loss of nipple or areola, skin loss, decrease or no nipple sensation, fat necrosis of the breast tissue, bleeding, infection, healing delay.  There are risks of anesthesia, changes to skin sensation and injury to nerves or blood vessels.  The muscle can be temporarily or permanently injured.  You may have an allergic reaction to tape, suture, glue, blood products which can result in skin discoloration, swelling, pain, skin lesions, poor healing.  Any of these can lead to the need for revisonal surgery or stage procedures.  A mastopexy has potential to interfere with diagnostic procedures.  Nipple or breast piercing can increase risks of infection.  This procedure is best done when the breast is fully developed.  Changes in the breast  will continue to occur over time.  Pregnancy can alter the outcomes of previous breast surgery, weight gain and weigh loss can also effect the long term appearance.     Electronically signed by: Evelena Leyden, PA-C 07/21/2023 3:55 PM

## 2023-07-21 NOTE — H&P (View-Only) (Signed)
 Patient ID: Tiffany Humphrey, female    DOB: [birth year], 52 y.o.   MRN: [id number]  Chief Complaint  Patient presents with   Pre-op Exam      ICD-10-CM   1. Malignant neoplasm of upper-outer quadrant of left breast in female, estrogen receptor positive (HCC)  C50.412    Z17.0        History of Present Illness: Tiffany Humphrey is a 52 y.o.  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  She presents for preoperative evaluation for upcoming procedure, left breast fat grafting and right breast mastopexy/reduction for symmetry, scheduled for 08/02/2023 with Dr. Ulice Bold.  The patient has not had problems with anesthesia, however she admits that she becomes particularly anxious and has experienced panic attacks in the past.  She asks that she is fully sedated before we stop her down on the bed.  Will have her discussed with anesthesia on day of surgery.  She endorses a 14-pack-year smoking history, but quit over 10 years ago.  She denies any COPD or other pulmonary disease.  Denies any cardiac disease, previous or current anticoagulation, stroke, or diabetes.  She denies any personal or family history of blood clots or clotting disorder.  She does endorse lower extremity varicosities.  She sees Dr. Smith Robert for oncology and reports that she is still taking tamoxifen.  Informed patient that we typically have patients hold the tamoxifen 2 weeks before and after surgery, will send clearance to the office of Dr. Smith Robert.  Discussed plans for surgery and patient is understanding agreeable.  Summary of Previous Visit: She met with Dr. Ulice Bold for consult 06/13/2023.  At that time, endorsed significant asymmetry following left breast cancer and subsequent surgeries as well as radiation.  She initially wanted the left breast to be slightly larger to match the right breast, but understands the risks of attempting that surgery particularly given her  history of radiation.  She also reported indentation on left lateral breast from biopsy that has been made worse with radiation and is uncomfortable.  Discussed options for surgery.  The safest would be for a right sided reduction/mastopexy for symmetry and left breast scar release and fat grafting.  This is something that patient was agreeable to.  Discussed expectations and patient is agreeable to proceed.  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  Recommended some time off from work postoperatively for recovery.  PMH Significant for: Left breast cancer s/p partial mastectomy/lumpectomy 2019 and subsequent radiation, left breast scar revision 05/2021, postoperative breast asymmetry, HTN, GERD, anxiety.   Past Medical History: Allergies: Allergies  Allergen Reactions   Quinolones Hives   Clindamycin Hives   Codeine Other (See Comments) and Palpitations    tachycardia    Current Medications:  Current Outpatient Medications:    acetaminophen (TYLENOL) 500 MG tablet, Take 2 tablets (1,000 mg total) by mouth every 8 (eight) hours., Disp: 90 tablet, Rfl: 2   ALPRAZolam (XANAX) 0.25 MG tablet, Take 1 tablet (0.25 mg total) by mouth daily as needed for anxiety., Disp: 30 tablet, Rfl: 0   calcium-vitamin D (OSCAL WITH D) 500-200 MG-UNIT tablet, Take 1 tablet by mouth daily., Disp: , Rfl:    cephALEXin (KEFLEX) 500 MG capsule, Take 1 capsule (500 mg total) by mouth 4 (four) times daily for 3 days., Disp: 12 capsule, Rfl: 0   Cyanocobalamin (B-12) 2500 MCG SUBL, Place 2,500 mcg under the tongue daily., Disp: ,  Rfl:    fluticasone (FLONASE) 50 MCG/ACT nasal spray, Place 2 sprays into both nostrils daily., Disp: 16 g, Rfl: 6   hydrochlorothiazide (MICROZIDE) 12.5 MG capsule, Take 1 capsule (12.5 mg total) by mouth daily., Disp: 90 capsule, Rfl: 1   ibuprofen (ADVIL) 800 MG tablet, Take 800 mg by mouth every 8 (eight) hours as needed for headache., Disp: , Rfl:    levocetirizine (XYZAL) 5 MG  tablet, Take 5 mg by mouth daily. , Disp: , Rfl:    meclizine (ANTIVERT) 25 MG tablet, Take 25 mg by mouth 3 (three) times daily as needed for dizziness., Disp: , Rfl:    Multiple Vitamin (MULTIVITAMIN WITH MINERALS) TABS tablet, Take 1 tablet by mouth daily., Disp: , Rfl:    omeprazole (PRILOSEC) 40 MG capsule, Take 1 capsule (40 mg total) by mouth daily., Disp: 90 capsule, Rfl: 1   ondansetron (ZOFRAN-ODT) 4 MG disintegrating tablet, Take 1 tablet (4 mg total) by mouth every 8 (eight) hours as needed for nausea or vomiting., Disp: 9 tablet, Rfl: 0   oxyCODONE (ROXICODONE) 5 MG immediate release tablet, Take 1 tablet (5 mg total) by mouth every 8 (eight) hours as needed for up to 7 days for severe pain (pain score 7-10)., Disp: 20 tablet, Rfl: 0   tamoxifen (NOLVADEX) 20 MG tablet, Take 1 tablet (20 mg total) by mouth daily., Disp: 90 tablet, Rfl: 2   traMADol (ULTRAM) 50 MG tablet, Take 1 tablet (50 mg total) by mouth every 6 (six) hours as needed (breast pain)., Disp: 30 tablet, Rfl: 0   traZODone (DESYREL) 50 MG tablet, Take 1 tablet (50 mg total) by mouth at bedtime., Disp: 90 tablet, Rfl: 1   triamcinolone (NASACORT) 55 MCG/ACT AERO nasal inhaler, Place 1 spray into the nose daily., Disp: , Rfl:   Past Medical Problems: Past Medical History:  Diagnosis Date   Anxiety    a.) on BZO (alprazolam) PRN   B12 deficiency    Complication of anesthesia    a.) PONV; b.) preopertive anxiety/panic attacks   GERD (gastroesophageal reflux disease)    Headache    migraines/stress, optical migraines   History of kidney stones    History of left breast cancer 06/2018   a.) stage I invasive mammary carcinoma (ER/PR + , HER2.neu -); s/p lumpectomy + adjuvant XRT + endocrine therapy (tamoxifen)   Hypertension    Multilevel degenerative disc disease    Personal history of radiation therapy    PONV (postoperative nausea and vomiting)    Sleep difficulties    a.) on trazodone PRN   Vertigo    last  episode 10/2016-NO PROBLEMS SINCE SINUS SURGERY IN 2018   Wears contact lenses     Past Surgical History: Past Surgical History:  Procedure Laterality Date   BREAST BIOPSY Right 06/08/2016   CYSTIC PAPILLARY APOCRINE METAPLASIA.    BREAST BIOPSY Left 06/06/2018   left breast stereo X clip positive   BREAST EXCISIONAL BIOPSY Left 06/22/2018   lumpectomy with sn and nl   BREAST EXCISIONAL BIOPSY Left 05/31/2021   removal of fat necrosis due to pain   BREAST LUMPECTOMY Left 06/22/2018   COLONOSCOPY  03/28/2022   CYSTOSTOMY Right 05/10/2019   Procedure: RIGHT OVARIAN CYSTOSTOMY;  Surgeon: Feliberto Gottron Ihor Austin, MD;  Location: [facility];  Service: Gynecology;  Laterality: Right;   FRONTAL SINUS EXPLORATION Bilateral 12/22/2016   Procedure: FRONTAL SINUS EXPLORATION;  Surgeon: Vernie Murders, MD;  Location: [facility];  Service: ENT;  Laterality:  Bilateral;   IMAGE GUIDED SINUS SURGERY N/A 12/22/2016   Procedure: IMAGE GUIDED SINUS SURGERY;  Surgeon: Vernie Murders, MD;  Location: [facility];  Service: ENT;  Laterality: N/A;   KNEE ARTHROSCOPY Right 01/13/2023   Procedure: Right knee diagnostic arthroscopy, partial synovectomy, and/or chondroplasty;  Surgeon: Signa Kell, MD;  Location: [facility];  Service: Orthopedics;  Laterality: Right;   LAPAROSCOPIC TUBAL LIGATION Bilateral 05/10/2019   Procedure: LAPAROSCOPIC TUBAL LIGATION, falope rings Right fallope ring  Left tubal cautery;  Surgeon: Schermerhorn, Ihor Austin, MD;  Location: [facility];  Service: Gynecology;  Laterality: Bilateral;   MASS EXCISION Left 05/31/2021   Procedure: EXCISION MASS (scar);  Surgeon: Carolan Shiver, MD;  Location: [facility];  Service: General;  Laterality: Left;   MAXILLARY ANTROSTOMY Bilateral 12/22/2016   Procedure: MAXILLARY ANTROSTOMY;  Surgeon: Vernie Murders, MD;  Location: [facility];  Service: ENT;  Laterality: Bilateral;   PARTIAL MASTECTOMY WITH NEEDLE LOCALIZATION Left  06/22/2018   Procedure: PARTIAL MASTECTOMY WITH NEEDLE LOCALIZATION;  Surgeon: Carolan Shiver, MD;  Location: [facility];  Service: General;  Laterality: Left;   SENTINEL NODE BIOPSY Left 06/22/2018   Procedure: SENTINEL NODE BIOPSY;  Surgeon: Carolan Shiver, MD;  Location: [facility];  Service: General;  Laterality: Left;   SEPTOPLASTY N/A 12/22/2016   Procedure: SEPTOPLASTY;  Surgeon: Vernie Murders, MD;  Location: [facility];  Service: ENT;  Laterality: N/A;  GAVE DISK TO CECE 4-5 KP   TURBINATE REDUCTION N/A 12/22/2016   Procedure: TURBINATE REDUCTION partial inferior;  Surgeon: Vernie Murders, MD;  Location: [facility];  Service: ENT;  Laterality: N/A;    Social History: Social History   Socioeconomic History   Marital status: Married    Spouse name: Therapist, nutritional   Number of children: 1   Years of education: Not on file   Highest education level: Associate degree: academic program  Occupational History   Not on file  Tobacco Use   Smoking status: Former    Current packs/day: 0.00    Average packs/day: 1 pack/day for 15.0 years (15.0 ttl pk-yrs)    Types: Cigarettes    Start date: 63    Quit date: 2013    Years since quitting: 11.8   Smokeless tobacco: Never  Vaping Use   Vaping status: Former  Substance and Sexual Activity   Alcohol use: Yes    Alcohol/week: 2.0 standard drinks of alcohol    Types: 1 Glasses of wine, 1 Cans of beer per week    Comment: social (4-6 drinks/month)   Drug use: Never   Sexual activity: Yes  Other Topics Concern   Not on file  Social History Narrative   Lives at home with hubby   Social Determinants of Health   Financial Resource Strain: Patient Declined (03/07/2023)   Overall Financial Resource Strain (CARDIA)    Difficulty of Paying Living Expenses: Patient declined  Food Insecurity: Patient Declined (03/07/2023)   Hunger Vital Sign    Worried About Running Out of Food in the Last Year: Patient declined    Ran Out  of Food in the Last Year: Patient declined  Transportation Needs: No Transportation Needs (03/07/2023)   PRAPARE - Administrator, Civil Service (Medical): No    Lack of Transportation (Non-Medical): No  Physical Activity: Unknown (03/07/2023)   Exercise Vital Sign    Days of Exercise per Week: 4 days    Minutes of Exercise per Session: Patient declined  Stress: No Stress Concern Present (  03/07/2023)   [place] [employer] - Occupational Stress Questionnaire    Feeling of Stress : Not at all  Social Connections: Moderately Integrated (03/07/2023)   Social Connection and Isolation Panel [NHANES]    Frequency of Communication with Friends and Family: More than three times a week    Frequency of Social Gatherings with Friends and Family: Twice a week    Attends Religious Services: 1 to 4 times per year    Active Member of [employer] or Organizations: No    Attends Engineer, structural: Not on file    Marital Status: Married  Catering manager Violence: Not on file    Family History: Family History  Problem Relation Age of Onset   Breast cancer Mother        dx 25s; 2nd primary at 26   Breast cancer Paternal Grandmother        dx 20s; deceased 72s   Breast cancer Paternal Aunt        dx 23s; currently 51s   Breast cancer Cousin        dx 16s; currently late 93s; daughter of a paternal uncle   Lung cancer Paternal Uncle        smoker; deceased    Review of Systems: ROS Denies any recent chest pain, difficulty breathing, leg swelling, fevers.  Physical Exam: Vital Signs LMP 05/25/2020 (Approximate)   Physical Exam Constitutional:      General: Not in acute distress.    Appearance: Normal appearance. Not ill-appearing.  HENT:     Head: Normocephalic and atraumatic.  Eyes:     Pupils: Pupils are equal, round. Cardiovascular:     Rate and Rhythm: Normal rate.    Pulses: Normal pulses.  Pulmonary:     Effort: No respiratory distress or  increased work of breathing.  Speaks in full sentences. Abdominal:     General: Abdomen is flat. No distension.   Musculoskeletal: Normal range of motion. No lower extremity swelling or edema.  Skin:    General: Skin is warm and dry.     Findings: No erythema or rash.  Neurological:     Mental Status: Alert and oriented to person, place, and time.  Psychiatric:        Mood and Affect: Mood normal.        Behavior: Behavior normal.    Assessment/Plan: The patient is scheduled for fat grafting left breast and right breast reduction/mastopexy for symmetry with Dr. Ulice Bold.  Risks, benefits, and alternatives of procedure discussed, questions answered and consent obtained.    Smoking Status: Former smoker, quit 10 years ago. Last Mammogram: 02/2023; Results: BI-RADS Category 2: Benign.  Caprini Score: 7; Risk Factors include: Age, BMI greater than 25, scattered varicosities, and length of planned surgery. Recommendation for mechanical and possibly pharmacological prophylaxis.  Will discuss with Dr. Ulice Bold and prescribe Lovenox if indicated, but otherwise encourage early ambulation.   Pictures obtained: 06/13/2023  Post-op Rx sent to pharmacy: Oxycodone, Zofran, Keflex.  Advised the patient to exercise caution if taking her trazodone or benzodiazepines in conjunction with her oxycodone given combined sedative effects.  Patient was provided with the General Surgical Risk consent document and Pain Medication Agreement prior to their appointment.  They had adequate time to read through the risk consent documents and Pain Medication Agreement. We also discussed them in person together during this preop appointment. All of their questions were answered to their satisfaction.  Recommended calling if they have any further questions.  Risk consent form and Pain Medication Agreement to be scanned into patient's chart.  The risks that can be encountered with and after liposuction were discussed and  include the following but no limited to these:  Asymmetry, fluid accumulation, firmness of the area, fat necrosis with death of fat tissue, bleeding, infection, delayed healing, anesthesia risks, skin sensation changes, injury to structures including nerves, blood vessels, and muscles which may be temporary or permanent, allergies to tape, suture materials and glues, blood products, topical preparations or injected agents, skin and contour irregularities, skin discoloration and swelling, deep vein thrombosis, cardiac and pulmonary complications, pain, which may persist, persistent pain, recurrence of the lesion, poor healing of the incision, possible need for revisional surgery or staged procedures. Thiere can also be persistent swelling, poor wound healing, rippling or loose skin, worsening of cellulite, swelling, and thermal burn or heat injury from ultrasound with the ultrasound-assisted lipoplasty technique. Any change in weight fluctuations can alter the outcome.  The risk that can be encountered with mastopexy/breast lift were discussed and include the following but not limited to these:  Breast asymmetry, fluid accumulation, firmness of the breast, inability to breast feed, loss of nipple or areola, skin loss, decrease or no nipple sensation, fat necrosis of the breast tissue, bleeding, infection, healing delay.  There are risks of anesthesia, changes to skin sensation and injury to nerves or blood vessels.  The muscle can be temporarily or permanently injured.  You may have an allergic reaction to tape, suture, glue, blood products which can result in skin discoloration, swelling, pain, skin lesions, poor healing.  Any of these can lead to the need for revisonal surgery or stage procedures.  A mastopexy has potential to interfere with diagnostic procedures.  Nipple or breast piercing can increase risks of infection.  This procedure is best done when the breast is fully developed.  Changes in the breast  will continue to occur over time.  Pregnancy can alter the outcomes of previous breast surgery, weight gain and weigh loss can also effect the long term appearance.     Electronically signed by: Evelena Leyden, PA-C 07/21/2023 3:55 PM

## 2023-07-24 ENCOUNTER — Encounter (HOSPITAL_BASED_OUTPATIENT_CLINIC_OR_DEPARTMENT_OTHER): Payer: Self-pay | Admitting: Plastic Surgery

## 2023-07-24 ENCOUNTER — Encounter: Payer: Self-pay | Admitting: Family Medicine

## 2023-07-24 ENCOUNTER — Other Ambulatory Visit: Payer: Self-pay

## 2023-07-24 NOTE — Progress Notes (Signed)
   07/24/23 1409  PAT Phone Screen  Is the patient taking a GLP-1 receptor agonist? No  Do You Have Diabetes? No  Do You Have Hypertension? Yes ("white coat syndrome")  Have You Ever Been to the ER for Asthma? No  Have You Taken Oral Steroids in the Past 3 Months? No  Do you Take Phenteramine or any Other Diet Drugs? No  Recent  Lab Work, EKG, CXR? (S)  Yes (EKG)  Where was this test performed? (S)  [facility] 01/16/23  Do you have a history of heart problems? No  Any Recent Hospitalizations? No  Height 5\' 5"  (1.651 m)  Weight 76.2 kg  Pat Appointment Scheduled (S)  Yes (BMP, pt states she will get her BMP from her PCP in [place])

## 2023-07-24 NOTE — Progress Notes (Signed)
   07/24/23 1409  PAT Phone Screen  Is the patient taking a GLP-1 receptor agonist? No  Do You Have Diabetes? No  Do You Have Hypertension? Yes ("white coat syndrome")  Have You Ever Been to the ER for Asthma? No  Have You Taken Oral Steroids in the Past 3 Months? No  Do you Take Phenteramine or any Other Diet Drugs? No  Recent  Lab Work, EKG, CXR? (S)  Yes (EKG)  Where was this test performed? (S)  [facility] 01/16/23  Do you have a history of heart problems? No  Any Recent Hospitalizations? No  Height 5\' 5"  (1.651 m)  Weight 76.2 kg  Pat Appointment Scheduled (S)  Yes (BMP)

## 2023-07-25 ENCOUNTER — Encounter (HOSPITAL_BASED_OUTPATIENT_CLINIC_OR_DEPARTMENT_OTHER)
Admission: RE | Admit: 2023-07-25 | Discharge: 2023-07-25 | Disposition: A | Discharge status: Home / Self Care | Payer: Managed Care, Other (non HMO) | Source: Ambulatory Visit | Attending: Plastic Surgery | Admitting: Plastic Surgery

## 2023-07-25 ENCOUNTER — Telehealth: Payer: Self-pay | Admitting: *Deleted

## 2023-07-25 DIAGNOSIS — Z01812 Encounter for preprocedural laboratory examination: Secondary | ICD-10-CM | POA: Diagnosis present

## 2023-07-25 DIAGNOSIS — I10 Essential (primary) hypertension: Secondary | ICD-10-CM | POA: Diagnosis not present

## 2023-07-25 LAB — BASIC METABOLIC PANEL
Anion gap: 9 (ref 5–15)
BUN: 8 mg/dL (ref 6–20)
CO2: 24 mmol/L (ref 22–32)
Calcium: 8.9 mg/dL (ref 8.9–10.3)
Chloride: 103 mmol/L (ref 98–111)
Creatinine, Ser: 0.83 mg/dL (ref 0.44–1.00)
GFR, Estimated: >60 mL/min (ref >60)
Glucose, Bld: 91 mg/dL (ref 70–99)
Potassium: 4.2 mmol/L (ref 3.5–5.1)
Sodium: 136 mmol/L (ref 135–145)

## 2023-07-25 NOTE — Telephone Encounter (Signed)
[facility] faxed request to Dr. Smith Robert about holding tamoxifen around the surgery . Form was faxed to there office at [phone] and the fax went through

## 2023-08-02 ENCOUNTER — Ambulatory Visit (HOSPITAL_BASED_OUTPATIENT_CLINIC_OR_DEPARTMENT_OTHER)
Admission: RE | Admit: 2023-08-02 | Discharge: 2023-08-02 | Disposition: A | Discharge status: Home / Self Care | Payer: Managed Care, Other (non HMO) | Attending: Plastic Surgery | Admitting: Plastic Surgery

## 2023-08-02 ENCOUNTER — Ambulatory Visit (HOSPITAL_BASED_OUTPATIENT_CLINIC_OR_DEPARTMENT_OTHER): Payer: Managed Care, Other (non HMO) | Admitting: Anesthesiology

## 2023-08-02 ENCOUNTER — Encounter (HOSPITAL_BASED_OUTPATIENT_CLINIC_OR_DEPARTMENT_OTHER)
Admission: RE | Disposition: A | Discharge status: Home / Self Care | Payer: Self-pay | Source: Home / Self Care | Attending: Plastic Surgery

## 2023-08-02 ENCOUNTER — Other Ambulatory Visit: Payer: Self-pay

## 2023-08-02 ENCOUNTER — Encounter (HOSPITAL_BASED_OUTPATIENT_CLINIC_OR_DEPARTMENT_OTHER): Payer: Self-pay | Admitting: Plastic Surgery

## 2023-08-02 DIAGNOSIS — Y812 Prosthetic and other implants, materials and accessory general- and plastic-surgery devices associated with adverse incidents: Secondary | ICD-10-CM | POA: Diagnosis not present

## 2023-08-02 DIAGNOSIS — Z9012 Acquired absence of left breast and nipple: Secondary | ICD-10-CM | POA: Diagnosis not present

## 2023-08-02 DIAGNOSIS — F419 Anxiety disorder, unspecified: Secondary | ICD-10-CM | POA: Insufficient documentation

## 2023-08-02 DIAGNOSIS — Z79810 Long term (current) use of selective estrogen receptor modulators (SERMs): Secondary | ICD-10-CM | POA: Insufficient documentation

## 2023-08-02 DIAGNOSIS — N62 Hypertrophy of breast: Secondary | ICD-10-CM

## 2023-08-02 DIAGNOSIS — Z853 Personal history of malignant neoplasm of breast: Secondary | ICD-10-CM | POA: Insufficient documentation

## 2023-08-02 DIAGNOSIS — Z79899 Other long term (current) drug therapy: Secondary | ICD-10-CM | POA: Diagnosis not present

## 2023-08-02 DIAGNOSIS — N651 Disproportion of reconstructed breast: Secondary | ICD-10-CM | POA: Insufficient documentation

## 2023-08-02 DIAGNOSIS — K219 Gastro-esophageal reflux disease without esophagitis: Secondary | ICD-10-CM | POA: Insufficient documentation

## 2023-08-02 DIAGNOSIS — T8544XA Capsular contracture of breast implant, initial encounter: Secondary | ICD-10-CM | POA: Diagnosis not present

## 2023-08-02 DIAGNOSIS — I10 Essential (primary) hypertension: Secondary | ICD-10-CM | POA: Insufficient documentation

## 2023-08-02 DIAGNOSIS — Z803 Family history of malignant neoplasm of breast: Secondary | ICD-10-CM | POA: Diagnosis not present

## 2023-08-02 DIAGNOSIS — Y838 Other surgical procedures as the cause of abnormal reaction of the patient, or of later complication, without mention of misadventure at the time of the procedure: Secondary | ICD-10-CM | POA: Diagnosis not present

## 2023-08-02 DIAGNOSIS — Z87891 Personal history of nicotine dependence: Secondary | ICD-10-CM | POA: Insufficient documentation

## 2023-08-02 DIAGNOSIS — M1990 Unspecified osteoarthritis, unspecified site: Secondary | ICD-10-CM | POA: Insufficient documentation

## 2023-08-02 DIAGNOSIS — Z923 Personal history of irradiation: Secondary | ICD-10-CM | POA: Insufficient documentation

## 2023-08-02 HISTORY — PX: LIPOSUCTION WITH LIPOFILLING: SHX6436

## 2023-08-02 HISTORY — PX: BREAST REDUCTION WITH MASTOPEXY: SHX6465

## 2023-08-02 SURGERY — MAMMOPLASTY, REDUCTION, WITH MASTOPEXY
Anesthesia: General | Site: Breast | Laterality: Right

## 2023-08-02 MED ORDER — LIDOCAINE HCL (CARDIAC) PF 100 MG/5ML IV SOSY
PREFILLED_SYRINGE | INTRAVENOUS | Status: DC | PRN
  Administered 2023-08-02: 40 mg via INTRAVENOUS

## 2023-08-02 MED ORDER — HYDROMORPHONE HCL 1 MG/ML IJ SOLN
INTRAMUSCULAR | Status: DC | PRN
Start: 2023-08-02 — End: 2023-08-02
  Administered 2023-08-02: .5 mg via INTRAVENOUS

## 2023-08-02 MED ORDER — CHLORHEXIDINE GLUCONATE CLOTH 2 % EX PADS: 6.0000 | MEDICATED_PAD | Freq: Once | CUTANEOUS | Status: DC

## 2023-08-02 MED ORDER — PHENYLEPHRINE 80 MCG/ML (10ML) SYRINGE FOR IV PUSH (FOR BLOOD PRESSURE SUPPORT)
PREFILLED_SYRINGE | INTRAVENOUS | Status: AC
Start: 2023-08-02 — End: ?
  Filled 2023-08-02: qty 10

## 2023-08-02 MED ORDER — LIDOCAINE-EPINEPHRINE 1 %-1:100000 IJ SOLN
INTRAMUSCULAR | Status: DC | PRN
  Administered 2023-08-02: 40 mL

## 2023-08-02 MED ORDER — ACETAMINOPHEN 10 MG/ML IV SOLN: 1000.0000 mg | Freq: Once | INTRAVENOUS | Status: DC | PRN

## 2023-08-02 MED ORDER — SCOPOLAMINE 1 MG/3DAYS TD PT72
MEDICATED_PATCH | TRANSDERMAL | Status: AC
Start: 2023-08-02 — End: ?
  Filled 2023-08-02: qty 1

## 2023-08-02 MED ORDER — EPHEDRINE 5 MG/ML INJ
INTRAVENOUS | Status: AC
  Filled 2023-08-02: qty 5

## 2023-08-02 MED ORDER — ACETAMINOPHEN 325 MG RE SUPP: 650.0000 mg | RECTAL | Status: DC | PRN

## 2023-08-02 MED ORDER — 0.9 % SODIUM CHLORIDE (POUR BTL) OPTIME
TOPICAL | Status: DC | PRN
  Administered 2023-08-02: 250 mL

## 2023-08-02 MED ORDER — HYDROMORPHONE HCL 1 MG/ML IJ SOLN
INTRAMUSCULAR | Status: AC
  Filled 2023-08-02: qty 0.5

## 2023-08-02 MED ORDER — ATROPINE SULFATE 0.4 MG/ML IV SOLN
INTRAVENOUS | Status: AC
  Filled 2023-08-02: qty 1

## 2023-08-02 MED ORDER — FLUCONAZOLE 150 MG PO TABS: 150.0000 mg | ORAL_TABLET | Freq: Every day | ORAL | 0 refills | Status: DC

## 2023-08-02 MED ORDER — MIDAZOLAM HCL 5 MG/5ML IJ SOLN
INTRAMUSCULAR | Status: DC | PRN
  Administered 2023-08-02: 2 mg via INTRAVENOUS

## 2023-08-02 MED ORDER — DEXAMETHASONE SODIUM PHOSPHATE 4 MG/ML IJ SOLN
INTRAMUSCULAR | Status: DC | PRN
  Administered 2023-08-02: 5 mg via INTRAVENOUS

## 2023-08-02 MED ORDER — FENTANYL CITRATE (PF) 100 MCG/2ML IJ SOLN
INTRAMUSCULAR | Status: AC
  Filled 2023-08-02: qty 2

## 2023-08-02 MED ORDER — CEFAZOLIN SODIUM-DEXTROSE 2-4 GM/100ML-% IV SOLN
2.0000 g | INTRAVENOUS | Status: AC
Start: 2023-08-02 — End: 2023-08-02
  Administered 2023-08-02: 2 g via INTRAVENOUS

## 2023-08-02 MED ORDER — OXYCODONE HCL 5 MG PO TABS: 5.0000 mg | ORAL_TABLET | ORAL | Status: DC | PRN

## 2023-08-02 MED ORDER — OXYCODONE HCL 5 MG/5ML PO SOLN: 5.0000 mg | Freq: Once | ORAL | Status: AC | PRN

## 2023-08-02 MED ORDER — ACETAMINOPHEN 325 MG PO TABS: 325.0000 mg | ORAL_TABLET | ORAL | Status: DC | PRN

## 2023-08-02 MED ORDER — VASHE WOUND IRRIGATION OPTIME
TOPICAL | Status: DC | PRN
  Administered 2023-08-02: 34 [oz_av]

## 2023-08-02 MED ORDER — DROPERIDOL 2.5 MG/ML IJ SOLN
INTRAMUSCULAR | Status: DC | PRN
  Administered 2023-08-02: .625 mg via INTRAVENOUS

## 2023-08-02 MED ORDER — BUPIVACAINE LIPOSOME 1.3 % IJ SUSP
INTRAMUSCULAR | Status: AC
  Filled 2023-08-02: qty 20

## 2023-08-02 MED ORDER — LIDOCAINE HCL 1 % IJ SOLN
INTRAVENOUS | Status: DC | PRN
  Administered 2023-08-02: 100 mL

## 2023-08-02 MED ORDER — SODIUM CHLORIDE 0.9 % IV SOLN: 250.0000 mL | INTRAVENOUS | Status: DC | PRN

## 2023-08-02 MED ORDER — MIDAZOLAM HCL 2 MG/2ML IJ SOLN
INTRAMUSCULAR | Status: AC
Start: 2023-08-02 — End: ?
  Filled 2023-08-02: qty 2

## 2023-08-02 MED ORDER — OXYCODONE HCL 5 MG PO TABS
5.0000 mg | ORAL_TABLET | Freq: Once | ORAL | Status: AC | PRN
  Administered 2023-08-02: 5 mg via ORAL

## 2023-08-02 MED ORDER — CEFAZOLIN SODIUM-DEXTROSE 2-4 GM/100ML-% IV SOLN
INTRAVENOUS | Status: AC
  Filled 2023-08-02: qty 100

## 2023-08-02 MED ORDER — PROPOFOL 10 MG/ML IV BOLUS
INTRAVENOUS | Status: DC | PRN
  Administered 2023-08-02: 150 mg via INTRAVENOUS

## 2023-08-02 MED ORDER — SODIUM CHLORIDE 0.9% FLUSH: 3.0000 mL | Freq: Two times a day (BID) | INTRAVENOUS | Status: DC

## 2023-08-02 MED ORDER — BUPIVACAINE LIPOSOME 1.3 % IJ SUSP
INTRAMUSCULAR | Status: DC | PRN
  Administered 2023-08-02: 20 mL

## 2023-08-02 MED ORDER — FENTANYL CITRATE (PF) 100 MCG/2ML IJ SOLN: 25.0000 ug | INTRAMUSCULAR | Status: DC | PRN

## 2023-08-02 MED ORDER — OXYCODONE HCL 5 MG PO TABS
ORAL_TABLET | ORAL | Status: AC
  Filled 2023-08-02: qty 1

## 2023-08-02 MED ORDER — LIDOCAINE 2% (20 MG/ML) 5 ML SYRINGE
INTRAMUSCULAR | Status: AC
  Filled 2023-08-02: qty 5

## 2023-08-02 MED ORDER — SODIUM CHLORIDE 0.9% FLUSH: 3.0000 mL | INTRAVENOUS | Status: DC | PRN

## 2023-08-02 MED ORDER — DEXAMETHASONE SODIUM PHOSPHATE 10 MG/ML IJ SOLN
INTRAMUSCULAR | Status: AC
  Filled 2023-08-02: qty 1

## 2023-08-02 MED ORDER — LACTATED RINGERS IV SOLN: INTRAVENOUS | Status: DC

## 2023-08-02 MED ORDER — ACETAMINOPHEN 325 MG PO TABS: 650.0000 mg | ORAL_TABLET | ORAL | Status: DC | PRN

## 2023-08-02 MED ORDER — DROPERIDOL 2.5 MG/ML IJ SOLN: 0.6250 mg | Freq: Once | INTRAMUSCULAR | Status: DC | PRN

## 2023-08-02 MED ORDER — SUCCINYLCHOLINE CHLORIDE 200 MG/10ML IV SOSY
PREFILLED_SYRINGE | INTRAVENOUS | Status: AC
Start: 2023-08-02 — End: ?
  Filled 2023-08-02: qty 10

## 2023-08-02 MED ORDER — ACETAMINOPHEN 160 MG/5ML PO SOLN: 325.0000 mg | ORAL | Status: DC | PRN

## 2023-08-02 MED ORDER — ONDANSETRON HCL 4 MG/2ML IJ SOLN
INTRAMUSCULAR | Status: AC
  Filled 2023-08-02: qty 2

## 2023-08-02 MED ORDER — SCOPOLAMINE 1 MG/3DAYS TD PT72
1.0000 | MEDICATED_PATCH | TRANSDERMAL | Status: DC
  Administered 2023-08-02: 1.5 mg via TRANSDERMAL

## 2023-08-02 MED ORDER — FENTANYL CITRATE (PF) 100 MCG/2ML IJ SOLN
INTRAMUSCULAR | Status: DC | PRN
  Administered 2023-08-02 (×3): 50 ug via INTRAVENOUS

## 2023-08-02 SURGICAL SUPPLY — 70 items
BAG DECANTER FOR FLEXI CONT (MISCELLANEOUS) IMPLANT
BINDER ABDOMINAL 10 UNV 27-48 (MISCELLANEOUS) IMPLANT
BINDER ABDOMINAL 12 SM 30-45 (SOFTGOODS) IMPLANT
BINDER ABDOMINAL 9 SM 30-45 (SOFTGOODS) IMPLANT
BINDER BREAST LRG (GAUZE/BANDAGES/DRESSINGS) IMPLANT
BINDER BREAST MEDIUM (GAUZE/BANDAGES/DRESSINGS) IMPLANT
BINDER BREAST XLRG (GAUZE/BANDAGES/DRESSINGS) IMPLANT
BINDER BREAST XXLRG (GAUZE/BANDAGES/DRESSINGS) IMPLANT
BLADE HEX COATED 2.75 (ELECTRODE) ×2 IMPLANT
BLADE SURG 10 STRL SS (BLADE) ×2 IMPLANT
BLADE SURG 15 STRL LF DISP TIS (BLADE) ×2 IMPLANT
BNDG ELASTIC 6X10 VLCR STRL LF (GAUZE/BANDAGES/DRESSINGS) IMPLANT
BNDG GAUZE DERMACEA FLUFF 4 (GAUZE/BANDAGES/DRESSINGS) IMPLANT
CANISTER SUCT 1200ML W/VALVE (MISCELLANEOUS) ×2 IMPLANT
COLLAGEN CELLERATERX 1 GRAM (Miscellaneous) IMPLANT
COVER BACK TABLE 60X90IN (DRAPES) ×2 IMPLANT
COVER MAYO STAND STRL (DRAPES) ×2 IMPLANT
DERMABOND ADVANCED .7 DNX12 (GAUZE/BANDAGES/DRESSINGS) ×2 IMPLANT
DRAIN CHANNEL 19F RND (DRAIN) IMPLANT
DRAPE LAPAROSCOPIC ABDOMINAL (DRAPES) ×2 IMPLANT
DRESSING MEPILEX FLEX 4X4 (GAUZE/BANDAGES/DRESSINGS) IMPLANT
DRSG MEPILEX FLEX 4X4 (GAUZE/BANDAGES/DRESSINGS) ×6
DRSG MEPILEX POST OP 4X8 (GAUZE/BANDAGES/DRESSINGS) IMPLANT
ELECT BLADE 4.0 EZ CLEAN MEGAD (MISCELLANEOUS) ×2
ELECT REM PT RETURN 9FT ADLT (ELECTROSURGICAL) ×2
ELECTRODE BLDE 4.0 EZ CLN MEGD (MISCELLANEOUS) IMPLANT
ELECTRODE REM PT RTRN 9FT ADLT (ELECTROSURGICAL) ×2 IMPLANT
EVACUATOR SILICONE 100CC (DRAIN) IMPLANT
EXTRACTOR CANIST REVOLVE STRL (CANNISTER) ×2 IMPLANT
GAUZE PAD ABD 8X10 STRL (GAUZE/BANDAGES/DRESSINGS) ×4 IMPLANT
GAUZE SPONGE 4X4 12PLY STRL (GAUZE/BANDAGES/DRESSINGS) IMPLANT
GAUZE SPONGE 4X4 12PLY STRL LF (GAUZE/BANDAGES/DRESSINGS) IMPLANT
GLOVE BIO SURGEON STRL SZ 6.5 (GLOVE) ×6 IMPLANT
GOWN STRL REUS W/ TWL LRG LVL3 (GOWN DISPOSABLE) ×4 IMPLANT
IV LACTATED RINGERS 1000ML (IV SOLUTION) ×4 IMPLANT
LINER CANISTER 1000CC FLEX (MISCELLANEOUS) ×2 IMPLANT
NDL HYPO 25X1 1.5 SAFETY (NEEDLE) ×2 IMPLANT
NEEDLE HYPO 25X1 1.5 SAFETY (NEEDLE) ×2
NS IRRIG 1000ML POUR BTL (IV SOLUTION) IMPLANT
PACK BASIN DAY SURGERY FS (CUSTOM PROCEDURE TRAY) ×2 IMPLANT
PAD ALCOHOL SWAB (MISCELLANEOUS) ×2 IMPLANT
PENCIL SMOKE EVACUATOR (MISCELLANEOUS) ×2 IMPLANT
PIN SAFETY STERILE (MISCELLANEOUS) IMPLANT
SLEEVE SCD COMPRESS KNEE MED (STOCKING) ×2 IMPLANT
SPIKE FLUID TRANSFER (MISCELLANEOUS) IMPLANT
SPONGE T-LAP 18X18 ~~LOC~~+RFID (SPONGE) ×4 IMPLANT
STRIP CLOSURE SKIN 1/2X4 (GAUZE/BANDAGES/DRESSINGS) ×2 IMPLANT
STRIP SUTURE WOUND CLOSURE 1/2 (MISCELLANEOUS) ×2 IMPLANT
SUT MNCRL AB 4-0 PS2 18 (SUTURE) ×2 IMPLANT
SUT MON AB 3-0 SH27 (SUTURE) ×2 IMPLANT
SUT MON AB 4-0 PS1 27 (SUTURE) ×2 IMPLANT
SUT MON AB 5-0 PS2 18 (SUTURE) ×4 IMPLANT
SUT PDS 3-0 CT2 (SUTURE) ×2
SUT PDS II 3-0 CT2 27 ABS (SUTURE) IMPLANT
SUT SILK 3 0 PS 1 (SUTURE) IMPLANT
SUT VIC AB 3-0 SH 27X BRD (SUTURE) ×2 IMPLANT
SYR 10ML LL (SYRINGE) ×10 IMPLANT
SYR 3ML 18GX1 1/2 (SYRINGE) IMPLANT
SYR 50ML LL SCALE MARK (SYRINGE) ×2 IMPLANT
SYR BULB IRRIG 60ML STRL (SYRINGE) ×2 IMPLANT
SYR CONTROL 10ML LL (SYRINGE) ×2 IMPLANT
SYR TOOMEY 50ML (SYRINGE) IMPLANT
TAPE MEASURE VINYL STERILE (MISCELLANEOUS) ×2 IMPLANT
TOWEL GREEN STERILE FF (TOWEL DISPOSABLE) ×4 IMPLANT
TRAY DSU PREP LF (CUSTOM PROCEDURE TRAY) ×2 IMPLANT
TUBE CONNECTING 20X1/4 (TUBING) ×2 IMPLANT
TUBING INFILTRATION IT-10001 (TUBING) ×2 IMPLANT
TUBING SET GRADUATE ASPIR 12FT (MISCELLANEOUS) ×2 IMPLANT
UNDERPAD 30X36 HEAVY ABSORB (UNDERPADS AND DIAPERS) ×4 IMPLANT
YANKAUER SUCT BULB TIP NO VENT (SUCTIONS) ×2 IMPLANT

## 2023-08-02 NOTE — Discharge Instructions (Addendum)
INSTRUCTIONS FOR AFTER BREAST SURGERY   You will likely have some questions about what to expect following your operation.  The following information will help you and your family understand what to expect when you are discharged from the hospital.  It is important to follow these guidelines to help ensure a smooth recovery and reduce complication.  Postoperative instructions include information on: diet, wound care, medications and physical activity.  AFTER SURGERY Expect to go home after the procedure.  In some cases, you may need to spend one night in the hospital for observation.  DIET Breast surgery does not require a specific diet.  However, the healthier you eat the better your body will heal. It is important to increasing your protein intake.  This means limiting the foods with sugar and carbohydrates.  Focus on vegetables and some meat.  If you have liposuction during your procedure be sure to drink water.  If your urine is bright yellow, then it is concentrated, and you need to drink more water.  As a general rule after surgery, you should have 8 ounces of water every hour while awake.  If you find you are persistently nauseated or unable to take in liquids let us know.  NO TOBACCO USE or EXPOSURE.  This will slow your healing process and lead to a wound.  WOUND CARE Leave the binder on for 3 days . Use fragrance free soap like Dial, Dove or [place].   After 3 days you can remove the binder to shower. Once dry apply binder or sports bra. If you have liposuction you will have a soft and spongy dressing (Lipofoam) that helps prevent creases in your skin.  Remove before you shower and then replace it.  It is also available on [employer]. If you have steri-strips / tape directly attached to your skin leave them in place. It is OK to get these wet.   No baths, pools or hot tubs for four weeks. We close your incision to leave the smallest and best-looking scar. No ointment or creams on your incisions  for four weeks.  No Neosporin (Too many skin reactions).  A few weeks after surgery you can use Mederma and start massaging the scar. We ask you to wear your binder or sports bra for the first 6 weeks around the clock, including while sleeping. This provides added comfort and helps reduce the fluid accumulation at the surgery site. NO Ice or heating pads to the operative site.  You have a very high risk of a BURN before you feel the temperature change.  ACTIVITY No heavy lifting until cleared by the doctor.  This usually means no more than a half-gallon of milk.  It is OK to walk and climb stairs. Moving your legs is very important to decrease your risk of a blood clot.  It will also help keep you from getting deconditioned.  Every 1 to 2 hours get up and walk for 5 minutes. This will help with a quicker recovery back to normal.  Let pain be your guide so you don't do too much.  This time is for you to recover.  You will be more comfortable if you sleep and rest with your head elevated either with a few pillows under you or in a recliner.  No stomach sleeping for a three months.  WORK Everyone returns to work at different times. As a rough guide, most people take at least 1 - 2 weeks off prior to returning to work. If  you need documentation for your job, give the forms to the front staff at the clinic.  DRIVING Arrange for someone to bring you home from the hospital after your surgery.  You may be able to drive a few days after surgery but not while taking any narcotics or valium.  BOWEL MOVEMENTS Constipation can occur after anesthesia and while taking pain medication.  It is important to stay ahead for your comfort.  We recommend taking Milk of Magnesia (2 tablespoons; twice a day) while taking the pain pills.  MEDICATIONS You may be prescribed should start after surgery At your preoperative visit for you history and physical you may have been given the following medications: An antibiotic: Start  this medication when you get home and take according to the instructions on the bottle. Zofran 4 mg:  This is to treat nausea and vomiting.  You can take this every 6 hours as needed and only if needed. Valium 2 mg for breast cancer patients: This is for muscle tightness if you have an implant or expander. This will help relax your muscle which also helps with pain control.  This can be taken every 12 hours as needed. Don't drive after taking this medication. Norco (hydrocodone/acetaminophen) 5/325 mg:  This is only to be used after you have taken the Motrin or the Tylenol. Every 8 hours as needed.   Over the counter Medication to take: Ibuprofen (Motrin) 600 mg:  Take this every 6 hours.  If you have additional pain then take 500 mg of the Tylenol every 8 hours.  Only take the Norco after you have tried these two. MiraLAX or Milk of Magnesia: Take this according to the bottle if you take the Norco.  WHEN TO CALL Call your surgeon's office if any of the following occur: Fever 101 degrees F or greater Excessive bleeding or fluid from the incision site. Pain that increases over time without aid from the medications Redness, warmth, or pus draining from incision sites Persistent nausea or inability to take in liquids Severe misshapen area that underwent the operation.  Information for Discharge Teaching: EXPAREL (bupivacaine liposome injectable suspension)   Pain relief is important to your recovery. The goal is to control your pain so you can move easier and return to your normal activities as soon as possible after your procedure. Your physician may use several types of medicines to manage pain, swelling, and more.  Your surgeon or anesthesiologist gave you EXPAREL(bupivacaine) to help control your pain after surgery.  EXPAREL is a local anesthetic designed to release slowly over an extended period of time to provide pain relief by numbing the tissue around the surgical site. EXPAREL is  designed to release pain medication over time and can control pain for up to 72 hours. Depending on how you respond to EXPAREL, you may require less pain medication during your recovery. EXPAREL can help reduce or eliminate the need for opioids during the first few days after surgery when pain relief is needed the most. EXPAREL is not an opioid and is not addictive. It does not cause sleepiness or sedation.   Important! A teal colored band has been placed on your arm with the date, time and amount of EXPAREL you have received. Please leave this armband in place for the full 96 hours following administration, and then you may remove the band. If you return to the hospital for any reason within 96 hours following the administration of EXPAREL, the armband provides important information that your  health care providers to know, and alerts them that you have received this anesthetic.    Possible side effects of EXPAREL: Temporary loss of sensation or ability to move in the area where medication was injected. Nausea, vomiting, constipation Rarely, numbness and tingling in your mouth or lips, lightheadedness, or anxiety may occur. Call your doctor right away if you think you may be experiencing any of these sensations, or if you have other questions regarding possible side effects.  Follow all other discharge instructions given to you by your surgeon or nurse. Eat a healthy diet and drink plenty of water or other fluids.   Post Anesthesia Home Care Instructions  Activity: Get plenty of rest for the remainder of the day. A responsible individual must stay with you for 24 hours following the procedure.  For the next 24 hours, DO NOT: -Drive a car -Advertising copywriter -Drink alcoholic beverages -Take any medication unless instructed by your physician -Make any legal decisions or sign important papers.  Meals: Start with liquid foods such as gelatin or soup. Progress to regular foods as tolerated.  Avoid greasy, spicy, heavy foods. If nausea and/or vomiting occur, drink only clear liquids until the nausea and/or vomiting subsides. Call your physician if vomiting continues.  Special Instructions/Symptoms: Your throat may feel dry or sore from the anesthesia or the breathing tube placed in your throat during surgery. If this causes discomfort, gargle with warm salt water. The discomfort should disappear within 24 hours.  If you had a scopolamine patch placed behind your ear for the management of post- operative nausea and/or vomiting:  1. The medication in the patch is effective for 72 hours, after which it should be removed.  Wrap patch in a tissue and discard in the trash. Wash hands thoroughly with soap and water. 2. You may remove the patch earlier than 72 hours if you experience unpleasant side effects which may include dry mouth, dizziness or visual disturbances. 3. Avoid touching the patch. Wash your hands with soap and water after contact with the patch.

## 2023-08-02 NOTE — Anesthesia Preprocedure Evaluation (Addendum)
Anesthesia Evaluation  Patient identified by MRN, date of birth, ID band Patient awake    Reviewed: Allergy & Precautions, NPO status , Patient's Chart, lab work & pertinent test results  History of Anesthesia Complications (+) PONV and history of anesthetic complications  Airway Mallampati: II  TM Distance: >3 FB Neck ROM: Full    Dental  (+) Teeth Intact, Dental Advisory Given   Pulmonary former smoker   breath sounds clear to auscultation       Cardiovascular hypertension, Pt. on medications  Rhythm:Regular Rate:Normal     Neuro/Psych  Headaches  Anxiety        GI/Hepatic Neg liver ROS,GERD  ,,  Endo/Other  negative endocrine ROS    Renal/GU negative Renal ROS     Musculoskeletal  (+) Arthritis ,    Abdominal   Peds  Hematology negative hematology ROS (+)   Anesthesia Other Findings   Reproductive/Obstetrics                             Anesthesia Physical Anesthesia Plan  ASA: 2  Anesthesia Plan: General   Post-op Pain Management: Tylenol PO (pre-op)* and Toradol IV (intra-op)*   Induction: Intravenous  PONV Risk Score and Plan: 4 or greater and Ondansetron, Dexamethasone, Midazolam and Scopolamine patch - Pre-op  Airway Management Planned: LMA and Oral ETT  Additional Equipment: None  Intra-op Plan:   Post-operative Plan: Extubation in OR  Informed Consent: I have reviewed the patients History and Physical, chart, labs and discussed the procedure including the risks, benefits and alternatives for the proposed anesthesia with the patient or authorized representative who has indicated his/her understanding and acceptance.     Dental advisory given  Plan Discussed with: CRNA  Anesthesia Plan Comments:        Anesthesia Quick Evaluation

## 2023-08-02 NOTE — Progress Notes (Signed)
Patient had small amount of blood drainage noted on umbilicus mepilex once pt got up to phase 2, drainage spot became a little larger in short time in phase 2. Gerre Pebbles PA paged to phase 2 once turnover for room 6 in OR was called to evaluate finding. Gerre Pebbles PA noted small opening. At approximately 1645 Garrett used 2ml of epi 1% with lidocaine to numb the area before using the 4.0 monocryl suture kit to place suture. New mepilex placed by PA. Pt VS obtained prior to DC.

## 2023-08-02 NOTE — Interval H&P Note (Signed)
History and Physical Interval Note:  08/02/2023 12:45 PM  Tiffany Humphrey  has presented today for surgery, with the diagnosis of Postoperative breast asymmetry.  The various methods of treatment have been discussed with the patient and family. After consideration of risks, benefits and other options for treatment, the patient has consented to  Procedure(s): right breast reduction mastopexy (Right) Fat grafting to left breast (Left) as a surgical intervention.  The patient's history has been reviewed, patient examined, no change in status, stable for surgery.  I have reviewed the patient's chart and labs.  Questions were answered to the patient's satisfaction.     [name] [name]

## 2023-08-02 NOTE — Op Note (Signed)
Breast Reduction Op note:    DATE OF PROCEDURE: 08/02/2023  LOCATION: [facility]  SURGEON: Foster Simpson, DO  ASSISTANT: Evelena Leyden, PA  PREOPERATIVE DIAGNOSIS 1. Breast Asymmetry of right breast 2. Radiation contracture of left breast 3. History of breast cancer  POSTOPERATIVE DIAGNOSIS Same as preoperative diagnosis  PROCEDURES 1. Right breast reduction for symmetry.  150 gm 2. Left breast contracture release / capsulotomy 2 x 4 cm with lipofilling 30 cc  COMPLICATIONS: None.  DRAINS: none  INDICATIONS FOR PROCEDURE Tiffany Humphrey is a 52 y.o. year-old female born on [birth year],with a history of symptomatic macromastia with concominant back pain, neck pain, shoulder grooving from her bra.   MRN: [id number]  CONSENT Informed consent was obtained directly from the patient. The risks, benefits and alternatives were fully discussed. Specific risks including but not limited to bleeding, infection, hematoma, seroma, scarring, pain, nipple necrosis, asymmetry, poor cosmetic results, and need for further surgery were discussed. The patient's questions were answered.  DESCRIPTION OF PROCEDURE  Patient was brought into the operating room and rested on the operating room table in the supine position.  SCDs were placed and appropriate padding was performed.  Antibiotics were given. The patient underwent general anesthesia and the chest was prepped and draped in a sterile fashion.  A timeout was performed and all information was confirmed to be correct by those in the room. Tumescent was placed in the lateral right breast and liposuction was done for improved symmetry.  Right side: Preoperative markings were confirmed.  Incision lines were injected with local containing epinephrine.  After waiting for vasoconstriction, the marked lines were incised with a #15 blade.  A Wise-pattern superomedial breast reduction was performed by de-epithelializing the pedicle,  using bovie to create the superomedial pedicle, and removing breast tissue from the lateral and inferior portions of the breast.  Care was taken to not undermine the breast pedicle. Hemostasis was achieved.  Experel and cellerate were placed in the pocket. The nipple was gently rotated into position and the soft tissue closed with 3-0 and 4-0 Monocryl.   The pocket was irrigated and hemostasis confirmed.  The deep tissues were approximated with 3-0 PDS sutures.  The skin was closed with deep dermal 3-0 Monocryl and subcuticular 4-0 Monocryl sutures.   Left side: The lateral breast was injected with local.  The skin was incised with a #15 blade for 2 mm.  The capsule and contracture of the scared area, fat and tissue was released with a forked knife. The area was then filled with the 30 cc of adipose tissue.  The adipose was harvested from the abdomen using the cannula and a 60 cc syringe.  This was done with a 22 mm opening in the skin with the #15 blade. Experel was placed at the lateral breast and periumbilical area. The incisions of the abdomen and left breast were closed with the 4-0 Monocryl.  Dermabond was applied.  A breast binder and ABDs were placed.  The nipple and skin flaps had good capillary refill at the end of the procedure.  The patient tolerated the procedure well. The patient was allowed to wake from anesthesia and taken to the recovery room in satisfactory condition.  The advanced practice practitioner (APP) assisted throughout the case.  The APP was essential in retraction and counter traction when needed to make the case progress smoothly.  This retraction and assistance made it possible to see the tissue plans for the procedure.  The assistance  was needed for blood control, tissue re-approximation and assisted with closure of the incision site.

## 2023-08-02 NOTE — Anesthesia Procedure Notes (Signed)
Procedure Name: LMA Insertion Date/Time: 08/02/2023 12:59 PM  Performed by: Ronnette Hila, CRNAPre-anesthesia Checklist: Patient identified, Emergency Drugs available, Suction available and Patient being monitored Patient Re-evaluated:Patient Re-evaluated prior to induction Oxygen Delivery Method: Circle System Utilized Preoxygenation: Pre-oxygenation with 100% oxygen Induction Type: IV induction Ventilation: Mask ventilation without difficulty LMA: LMA inserted LMA Size: 4.0 Number of attempts: 1 Airway Equipment and Method: bite block Placement Confirmation: positive ETCO2 Tube secured with: Tape Dental Injury: Teeth and Oropharynx as per pre-operative assessment

## 2023-08-02 NOTE — Anesthesia Postprocedure Evaluation (Signed)
Anesthesia Post Note  Patient: Tiffany Humphrey  Procedure(s) Performed: right breast reduction mastopexy (Right: Breast) Fat grafting to left breast (Left: Breast)     Anesthesia Type: General Anesthetic complications: no   No notable events documented.  Last Vitals:  Vitals:   08/02/23 1430 08/02/23 1445  BP: 129/79 124/72  Pulse: 90 82  Resp: 19 17  Temp:    SpO2: 98% 96%    Last Pain:  Vitals:   08/02/23 1445  TempSrc:   PainSc: Asleep                 Pa Tennant

## 2023-08-02 NOTE — Transfer of Care (Signed)
Immediate Anesthesia Transfer of Care Note  Patient: Tiffany Humphrey  Procedure(s) Performed: right breast reduction mastopexy (Right: Breast) Fat grafting to left breast (Left: Breast)  Patient Location: PACU  Anesthesia Type:General  Level of Consciousness: awake, alert , oriented, drowsy, and patient cooperative  Airway & Oxygen Therapy: Patient Spontanous Breathing and Patient connected to face mask oxygen  Post-op Assessment: Report given to RN and Post -op Vital signs reviewed and stable  Post vital signs: Reviewed and stable  Last Vitals:  Vitals Value Taken Time  BP    Temp    Pulse 88 08/02/23 1425  Resp    SpO2 95 % 08/02/23 1425  Vitals shown include unfiled device data.  Last Pain:  Vitals:   08/02/23 0950  TempSrc: Oral  PainSc: 0-No pain      Patients Stated Pain Goal: 3 (08/02/23 0950)  Complications: No notable events documented.

## 2023-08-03 ENCOUNTER — Encounter: Payer: Managed Care, Other (non HMO) | Admitting: Student

## 2023-08-03 ENCOUNTER — Encounter (HOSPITAL_BASED_OUTPATIENT_CLINIC_OR_DEPARTMENT_OTHER): Payer: Self-pay | Admitting: Plastic Surgery

## 2023-08-03 NOTE — Telephone Encounter (Signed)
Spoke with patient. Cough is already much improved since this AM. Will touch base with her again tomorrow. Thank you

## 2023-08-04 ENCOUNTER — Ambulatory Visit: Payer: Managed Care, Other (non HMO) | Admitting: Physician Assistant

## 2023-08-04 DIAGNOSIS — Z170 Estrogen receptor positive status [ER+]: Secondary | ICD-10-CM

## 2023-08-04 DIAGNOSIS — N6489 Other specified disorders of breast: Secondary | ICD-10-CM

## 2023-08-04 DIAGNOSIS — C50412 Malignant neoplasm of upper-outer quadrant of left female breast: Secondary | ICD-10-CM

## 2023-08-04 NOTE — Progress Notes (Signed)
Patient is a pleasant 52 year old female s/p right breast oncoplastic reduction mastopexy for symmetry and left breast contracture release with fat grafting performed 08/02/2023 by Dr. Ulice Bold who joins via telephone for postoperative day 2 check-in.  Reviewed operative report and 150 g tissue was removed from the right breast at time of the reduction/mastopexy for symmetry.  30 cc fat grafting harvested from the abdomen was cleaned and grafted to the left reconstructed breast.  Today, patient is doing well.  She is exceedingly happy with her outcome.  She feels as though the contracture release on the left breast looks excellent and she is also pleased with the new size of her right breast.  She does state that the right breast does have some burning discomfort and inquires about nerve pain medication.  She is currently taking ibuprofen and oxycodone.  She states that the right breast itself looks excellent though, denies any incisional bleeding or asymmetric swelling/bruising.  Patient slept in a recliner and had difficulty with it.  She also denies any leg swelling, chest pain, difficulty breathing, or fevers.  She is tolerating p.o. intake well, voiding.  No BM yet.  Introducing Senokot.  Ambulatory.  Emphasized the importance of increased hydration and ambulation to help with the expected postoperative ileus.  Recommended that she sleep in a bed, but recommended the try to flank herself with pillows to avoid rolling onto her stomach.  It sounds as though she is doing really well from a postoperative standpoint.  Regarding the intermittent fleeting burning discomfort in the right breast, patient understands that it will improve as we get further from surgery.  Will try to avoid nerve block of medication at this time given combined sedative effects of oxycodone.  Instead, recommending that she alternate ibuprofen and Tylenol regularly and take oxycodone only as needed.  Patient to follow-up in office  next week as scheduled.  She can call the office/on-call service if she has any questions or concerns in interim.

## 2023-08-11 ENCOUNTER — Ambulatory Visit (INDEPENDENT_AMBULATORY_CARE_PROVIDER_SITE_OTHER): Payer: Managed Care, Other (non HMO) | Admitting: Plastic Surgery

## 2023-08-11 VITALS — BP 126/81 | HR 78

## 2023-08-11 DIAGNOSIS — Z9889 Other specified postprocedural states: Secondary | ICD-10-CM

## 2023-08-11 DIAGNOSIS — N6489 Other specified disorders of breast: Secondary | ICD-10-CM

## 2023-08-11 DIAGNOSIS — C50412 Malignant neoplasm of upper-outer quadrant of left female breast: Secondary | ICD-10-CM

## 2023-08-11 NOTE — Progress Notes (Signed)
The patient is a 52 year old female here for follow-up after undergoing surgery last week.  She had fat grafting with release of scar contracture to the left breast and a right breast reduction mastopexy.  Overall she is doing really well.  She has a lot of bruising and swelling as would expect with the surgery.  Her bowels are moving and her pain is well-controlled.  Will plan to get more pictures at her next visit.  Pictures were obtained of the patient and placed in the chart with the patient's or guardian's permission.

## 2023-08-22 ENCOUNTER — Ambulatory Visit: Payer: Managed Care, Other (non HMO) | Admitting: Physician Assistant

## 2023-08-22 VITALS — BP 137/84 | HR 95

## 2023-08-22 DIAGNOSIS — N6489 Other specified disorders of breast: Secondary | ICD-10-CM

## 2023-08-22 NOTE — Progress Notes (Signed)
Patient is a pleasant 52 year old female s/p right breast oncoplastic reduction mastopexy for symmetry and left breast contracture release with fat grafting performed 08/02/2023 by Dr. Ulice Bold who presents to clinic for postoperative follow-up.  She was last seen here in clinic on 08/11/2023.  At that time, she was doing well.  Bruising and swelling as expected.  Today, she is accompanied by her husband at bedside.  She states that she is quite pleased with the left breast contracture release.  As for the right sided reduction, she does endorse some intermittent fleeting sharp pains.  She also states that there is some tenderness underneath the breast when wearing her bra.  She denies any chest pain, difficulty breathing, leg swelling, or fevers.  She is ambulatory, tolerating p.o. intake without difficulty.  Voiding.  On exam, breasts with improved shape and symmetry.  At the site of left breast contracture release there is an area of firmness, likely reflective of fat necrosis.  Measures approximately 3 x 3 cm.  Excellent resolution of the contracture itself.  As for the right breast, NAC is viable.  Steri-Strips removed without complication or difficulty.  No incisional wounds or dehiscence.  Discussed Vaseline to the right breast incisions and mechanical massage of the left breast area of firmness.  Continue with activity modifications and compressive garments.  Can likely transition to silicone scar gel after all residual Dermabond is removed from right breast.  Follow-up in 2 weeks, sooner if needed.  Picture(s) obtained of the patient and placed in the chart were with the patient's or guardian's permission.

## 2023-08-29 ENCOUNTER — Ambulatory Visit: Payer: Managed Care, Other (non HMO) | Admitting: Family Medicine

## 2023-09-02 ENCOUNTER — Other Ambulatory Visit: Payer: Self-pay | Admitting: Family Medicine

## 2023-09-02 DIAGNOSIS — K219 Gastro-esophageal reflux disease without esophagitis: Secondary | ICD-10-CM

## 2023-09-02 DIAGNOSIS — F419 Anxiety disorder, unspecified: Secondary | ICD-10-CM

## 2023-09-02 DIAGNOSIS — R030 Elevated blood-pressure reading, without diagnosis of hypertension: Secondary | ICD-10-CM

## 2023-09-02 DIAGNOSIS — R601 Generalized edema: Secondary | ICD-10-CM

## 2023-09-02 DIAGNOSIS — F5101 Primary insomnia: Secondary | ICD-10-CM

## 2023-09-02 DIAGNOSIS — F329 Major depressive disorder, single episode, unspecified: Secondary | ICD-10-CM

## 2023-09-06 ENCOUNTER — Ambulatory Visit (INDEPENDENT_AMBULATORY_CARE_PROVIDER_SITE_OTHER): Payer: Managed Care, Other (non HMO) | Admitting: Physician Assistant

## 2023-09-06 VITALS — BP 143/86 | HR 103

## 2023-09-06 DIAGNOSIS — N6489 Other specified disorders of breast: Secondary | ICD-10-CM

## 2023-09-06 NOTE — Progress Notes (Signed)
 Patient is a pleasant 53 year old female s/p right breast oncoplastic reduction mastopexy for symmetry and left breast contracture release with fat grafting performed 08/02/2023 by Dr. Lowery who presents to clinic for postoperative follow-up.   She was last seen here in clinic on 08/22/2023.  At that time, she was pleased with the left breast contracture release.  However, she did endorse some pain at the site of her right breast reduction.  Area of firmness, likely reflective of fat necrosis, on left breast measuring approximately 3 x 3 cm.  Excellent resolution of the contracture itself.  Right breast exam benign.  Recommended Vaseline to the incisions and mechanical massage of the left breast area of firmness.  Today, patient is doing well.  She is pleased with the outcome of her surgery.  She states that the right breast has continued to fall and is looking increasingly symmetric with the left breast.  She has an area of irritation and redness that is tender at the 3 o'clock position right NAC.  She also has her persistent firmness on the left breast which she states she has been massaging regularly.  No other concerns.  She is eager to resume normal activity and discontinue compression bras.  On exam, breasts with improved shape and symmetry.  She does have an area of erythema approximately 1 x 1 cm at 3 o'clock position right NAC that is tender to palpation.  Incisions otherwise CDI and healing well.  Left breast with persistent area of firmness approximately 3 x 3 cm just superior lateral to the NAC where fat grafting was performed.  Recommending continue mechanical massage of the area of firmness.  As for the area of irritation right NAC, provided patient with 2 point biotic which I recommend she apply daily for the next week.  She will follow-up with our office in 2 weeks for likely final postoperative encounter.  Discussed transition to silicone scar gel once the area of irritation is healed.   She understands the area of firmness on the left breast can persist for several months before it will soften.  Increase activity as tolerated.  Picture(s) obtained of the patient and placed in the chart were with the patient's or guardian's permission.

## 2023-09-12 ENCOUNTER — Encounter: Payer: Managed Care, Other (non HMO) | Admitting: Plastic Surgery

## 2023-09-17 ENCOUNTER — Other Ambulatory Visit: Payer: Self-pay | Admitting: Oncology

## 2023-09-18 ENCOUNTER — Other Ambulatory Visit: Payer: Self-pay | Admitting: Nurse Practitioner

## 2023-09-18 ENCOUNTER — Inpatient Hospital Stay: Payer: Managed Care, Other (non HMO) | Admitting: Oncology

## 2023-09-19 NOTE — Progress Notes (Deleted)
Patient is a pleasant 53 year old female s/p right breast oncoplastic reduction mastopexy for symmetry and left breast contracture release with fat grafting performed 08/02/2023 by Dr. Ulice Bold who presents to clinic for postoperative follow-up.   She was last seen here in clinic on 09/06/2023.  At that time, she noticed persistent firmness on left breast for which she has been continuing to massage regularly.  On exam, area of erythema measuring approximately 1 x 1 cm at 3 o'clock position right NAC that is tender to palpation.  Left breast persistent area of firmness approximately 3 x 3 cm just superior lateral to the left NAC where fat grafting was performed.  Recommend continued mechanical massage of the area of firmness and triple antibiotic for the area of erythema right NAC.  Follow-up in 2 weeks for likely final postoperative encounter.  Today,

## 2023-09-20 ENCOUNTER — Encounter: Payer: Managed Care, Other (non HMO) | Admitting: Physician Assistant

## 2023-09-25 NOTE — Progress Notes (Deleted)
Patient is a pleasant 53 year old female s/p right breast oncoplastic reduction mastopexy for symmetry and left breast contracture release with fat grafting performed 08/02/2023 by Dr. Ulice Bold who presents to clinic for postoperative follow-up.   She was last seen here in clinic on 09/06/2023.  At that time, she was doing well.  She did have an area of irritation and redness that was tender to palpation at 3 o'clock position right NAC for which topical antibiotics were prescribed.  She also had an area of firmness in the left breast for which mechanical massage was encouraged.  Follow-up in 2 weeks for likely final postoperative encounter.  Today,

## 2023-09-27 ENCOUNTER — Encounter: Payer: Managed Care, Other (non HMO) | Admitting: Physician Assistant

## 2023-09-27 ENCOUNTER — Encounter: Payer: Managed Care, Other (non HMO) | Admitting: Student

## 2023-09-28 ENCOUNTER — Other Ambulatory Visit: Payer: Self-pay | Admitting: Family Medicine

## 2023-09-28 DIAGNOSIS — K219 Gastro-esophageal reflux disease without esophagitis: Secondary | ICD-10-CM

## 2023-09-28 DIAGNOSIS — R601 Generalized edema: Secondary | ICD-10-CM

## 2023-09-28 DIAGNOSIS — R030 Elevated blood-pressure reading, without diagnosis of hypertension: Secondary | ICD-10-CM

## 2023-10-06 ENCOUNTER — Encounter: Payer: Self-pay | Admitting: Oncology

## 2023-10-06 ENCOUNTER — Other Ambulatory Visit: Payer: Self-pay

## 2023-10-06 ENCOUNTER — Ambulatory Visit (INDEPENDENT_AMBULATORY_CARE_PROVIDER_SITE_OTHER): Payer: Managed Care, Other (non HMO) | Admitting: Student

## 2023-10-06 DIAGNOSIS — N6489 Other specified disorders of breast: Secondary | ICD-10-CM

## 2023-10-06 DIAGNOSIS — Z719 Counseling, unspecified: Secondary | ICD-10-CM

## 2023-10-06 MED ORDER — TAMOXIFEN CITRATE 20 MG PO TABS: 20.0000 mg | ORAL_TABLET | Freq: Every day | ORAL | 2 refills | Status: DC

## 2023-10-06 NOTE — Progress Notes (Signed)
 Patient is a pleasant 53 year old female s/p right breast oncoplastic reduction mastopexy for symmetry and left breast contracture release with fat grafting performed 08/02/2023 by Dr. Lowery who presents to clinic for postoperative follow-up.   She was last seen here in clinic on 09/06/2023.  At that time, she was doing well.  She did have an area of irritation and redness that was tender to palpation at 3 o'clock position right NAC for which topical antibiotics were prescribed.  She also had an area of firmness in the left breast for which mechanical massage was encouraged.  Follow-up in 2 weeks for likely final postoperative encounter.  Today, patient reports she is doing well.  She states that she has still been applying antibiotic ointment to the bump on her right NAC.  She reports that this has been improving.  She also states that she has been massaging the left lateral breast.  She states that it has gotten smaller.  She denies any other issues or concerns.  Denies any fevers or chills.  Chaperone present on exam.  On exam, patient is sitting upright in no acute distress.  Breasts are symmetric.  NAC's appear to be healthy.  Area of redness to the 3 o'clock position of the right NAC appears to be completely resolved.  There is no drainage.  No fluctuance.  No tenderness to palpation.  Incision appears to be well-healed.  No fluid collections palpated on exam.  No overlying erythema.  There is still some firmness noted to the left lateral breast, this feels consistent with fat necrosis.  There are no signs of infection on exam.  Recommended that patient apply Vaseline to the area where she had the little bit of redness before.  Discussed with her she may stop the antibiotic ointment.  Discussed with her that after a few weeks, she may transition to scar creams to that area.  Recommended that she transition to scar creams to the remainder of her incisions now.  Patient expressed  understanding.  Discussed with patient to continue to massage the area of firmness to her left breast.  Discussed that this is most likely fat necrosis and should soften up with time.  Discussed with her to closely monitor the area, and if it grows in size or if she has any concerns regarding the area to let us  know.  Patient expressed understanding.  We will have the patient follow-up with Dr. Lowery in about 3 months to reevaluate firmness to her left breast.  Instructed patient to call in the meantime she has any questions or concerns about anything.  Pictures were obtained of the patient and placed in the chart with the patient's or guardian's permission.

## 2023-10-10 ENCOUNTER — Ambulatory Visit: Payer: Managed Care, Other (non HMO) | Admitting: Oncology

## 2023-10-16 ENCOUNTER — Inpatient Hospital Stay: Payer: Managed Care, Other (non HMO) | Attending: Oncology | Admitting: Oncology

## 2023-10-16 ENCOUNTER — Encounter: Payer: Self-pay | Admitting: Oncology

## 2023-10-16 ENCOUNTER — Other Ambulatory Visit: Payer: Self-pay

## 2023-10-16 VITALS — BP 123/92 | HR 88 | Temp 97.8°F | Resp 18 | Wt 164.0 lb

## 2023-10-16 DIAGNOSIS — Z87891 Personal history of nicotine dependence: Secondary | ICD-10-CM

## 2023-10-16 DIAGNOSIS — C50912 Malignant neoplasm of unspecified site of left female breast: Secondary | ICD-10-CM | POA: Diagnosis not present

## 2023-10-16 DIAGNOSIS — Z170 Estrogen receptor positive status [ER+]: Secondary | ICD-10-CM | POA: Diagnosis not present

## 2023-10-16 DIAGNOSIS — Z79810 Long term (current) use of selective estrogen receptor modulators (SERMs): Secondary | ICD-10-CM | POA: Insufficient documentation

## 2023-10-16 DIAGNOSIS — M85851 Other specified disorders of bone density and structure, right thigh: Secondary | ICD-10-CM | POA: Diagnosis not present

## 2023-10-16 DIAGNOSIS — Z08 Encounter for follow-up examination after completed treatment for malignant neoplasm: Secondary | ICD-10-CM

## 2023-10-16 DIAGNOSIS — Z5181 Encounter for therapeutic drug level monitoring: Secondary | ICD-10-CM

## 2023-10-16 MED ORDER — LETROZOLE 2.5 MG PO TABS: 2.5000 mg | ORAL_TABLET | Freq: Every day | ORAL | 2 refills | Status: AC

## 2023-10-16 NOTE — Progress Notes (Signed)
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  She underwent Lumpectomy with sentinel lymph node biopsy in October 2019. Final pathology showed invasive mammary carcinoma 5 mm grade 2 ER greater than 90% positive PR 51 to 90% positive and HER-2/neu negative with negative margins.  Sentinel lymph node was negative for malignancy.  This was associated with high-grade DCIS.  Oncotype came back at 16 and therefore she did not require adjuvant chemotherapy.  She completed adjuvant radiation.  She was premenopausal at diagnosis and was started on tamoxifen in February 2020.  She was subsequently postmenopausal sometime in 2023  Interval history-patient underwent surgical correction of bilateral breasts for asymmetry and contracture release in December 2024.  She is still sore from her surgery but is gradually improving.  Tolerating tamoxifen well without any significant side effects.  Denies any changes in her appetite and weight.  Denies any new aches and pains anywhere  ECOG PS- 0 Pain scale-0  Review of systems- Review of Systems  Constitutional:  Negative for chills, fever, malaise/fatigue and weight loss.  HENT:  Negative for congestion, ear discharge and nosebleeds.   Eyes:  Negative for blurred vision.  Respiratory:  Negative for cough, hemoptysis, sputum production, shortness of breath and wheezing.   Cardiovascular:  Negative for chest pain, palpitations, orthopnea and  claudication.  Gastrointestinal:  Negative for abdominal pain, blood in stool, constipation, diarrhea, heartburn, melena, nausea and vomiting.  Genitourinary:  Negative for dysuria, flank pain, frequency, hematuria and urgency.  Musculoskeletal:  Negative for back pain, joint pain and myalgias.  Skin:  Negative for rash.  Neurological:  Negative for dizziness, tingling, focal weakness, seizures, weakness and headaches.  Endo/Heme/Allergies:  Does not bruise/bleed easily.  Psychiatric/Behavioral:  Negative for depression and suicidal ideas. The patient does not have insomnia.       Allergies  Allergen Reactions   Quinolones Hives   Clindamycin Hives   Codeine Other (See Comments) and Palpitations    tachycardia     Past Medical History:  Diagnosis Date   Anxiety    a.) on BZO (alprazolam) PRN   B12 deficiency    Complication of anesthesia    a.) PONV; b.) preopertive anxiety/panic attacks   GERD (gastroesophageal reflux disease)    Headache    migraines/stress, optical migraines   History of kidney stones    History of left breast cancer 06/2018   a.) stage I invasive mammary carcinoma (ER/PR + , HER2.neu -); s/p lumpectomy + adjuvant XRT + endocrine therapy (tamoxifen)   Hypertension    Multilevel degenerative disc disease    Personal history of radiation therapy    PONV (postoperative nausea and vomiting)    Sleep difficulties    a.) on trazodone PRN   Vertigo    last episode 10/2016-NO PROBLEMS SINCE SINUS SURGERY IN 2018   Wears contact lenses      Past Surgical History:  Procedure Laterality Date   BREAST BIOPSY Right 06/08/2016   CYSTIC PAPILLARY APOCRINE METAPLASIA.  BREAST BIOPSY Left 06/06/2018   left breast stereo X clip positive   BREAST EXCISIONAL BIOPSY Left 06/22/2018   lumpectomy with sn and nl   BREAST EXCISIONAL BIOPSY Left 05/31/2021   removal of fat necrosis due to pain   BREAST LUMPECTOMY Left 06/22/2018   BREAST REDUCTION WITH MASTOPEXY  Right 08/02/2023   Procedure: right breast reduction mastopexy;  Surgeon: Peggye Form, DO;  Location: [facility];  Service: Plastics;  Laterality: Right;   COLONOSCOPY  03/28/2022   CYSTOSTOMY Right 05/10/2019   Procedure: RIGHT OVARIAN CYSTOSTOMY;  Surgeon: Schermerhorn, Ihor Austin, MD;  Location: [facility];  Service: Gynecology;  Laterality: Right;   FRONTAL SINUS EXPLORATION Bilateral 12/22/2016   Procedure: FRONTAL SINUS EXPLORATION;  Surgeon: Vernie Murders, MD;  Location: [facility];  Service: ENT;  Laterality: Bilateral;   IMAGE GUIDED SINUS SURGERY N/A 12/22/2016   Procedure: IMAGE GUIDED SINUS SURGERY;  Surgeon: Vernie Murders, MD;  Location: [facility];  Service: ENT;  Laterality: N/A;   KNEE ARTHROSCOPY Right 01/13/2023   Procedure: Right knee diagnostic arthroscopy, partial synovectomy, and/or chondroplasty;  Surgeon: Signa Kell, MD;  Location: [facility];  Service: Orthopedics;  Laterality: Right;   LAPAROSCOPIC TUBAL LIGATION Bilateral 05/10/2019   Procedure: LAPAROSCOPIC TUBAL LIGATION, falope rings Right fallope ring  Left tubal cautery;  Surgeon: Schermerhorn, Ihor Austin, MD;  Location: [facility];  Service: Gynecology;  Laterality: Bilateral;   LIPOSUCTION WITH LIPOFILLING Left 08/02/2023   Procedure: Fat grafting to left breast;  Surgeon: Peggye Form, DO;  Location:  SURGERY CENTER;  Service: Plastics;  Laterality: Left;   MASS EXCISION Left 05/31/2021   Procedure: EXCISION MASS (scar);  Surgeon: Carolan Shiver, MD;  Location: [facility];  Service: General;  Laterality: Left;   MAXILLARY ANTROSTOMY Bilateral 12/22/2016   Procedure: MAXILLARY ANTROSTOMY;  Surgeon: Vernie Murders, MD;  Location: [facility];  Service: ENT;  Laterality: Bilateral;   PARTIAL MASTECTOMY WITH NEEDLE LOCALIZATION Left 06/22/2018   Procedure: PARTIAL MASTECTOMY WITH NEEDLE LOCALIZATION;  Surgeon: Carolan Shiver, MD;  Location: [facility];   Service: General;  Laterality: Left;   SENTINEL NODE BIOPSY Left 06/22/2018   Procedure: SENTINEL NODE BIOPSY;  Surgeon: Carolan Shiver, MD;  Location: [facility];  Service: General;  Laterality: Left;   SEPTOPLASTY N/A 12/22/2016   Procedure: SEPTOPLASTY;  Surgeon: Vernie Murders, MD;  Location: [facility];  Service: ENT;  Laterality: N/A;  GAVE DISK TO [facility] 4-5 KP   TURBINATE REDUCTION N/A 12/22/2016   Procedure: TURBINATE REDUCTION partial inferior;  Surgeon: Vernie Murders, MD;  Location: [facility];  Service: ENT;  Laterality: N/A;    Social History   Socioeconomic History   Marital status: Married    Spouse name: Therapist, nutritional   Number of children: 1   Years of education: Not on file   Highest education level: Associate degree: academic program  Occupational History   Not on file  Tobacco Use   Smoking status: Former    Current packs/day: 0.00    Average packs/day: 1 pack/day for 15.0 years (15.0 ttl pk-yrs)    Types: Cigarettes    Start date: 58    Quit date: 2013    Years since quitting: 12.1   Smokeless tobacco: Never  Vaping Use   Vaping status: Former  Substance and Sexual Activity   Alcohol use: Yes    Alcohol/week: 2.0 standard drinks of alcohol    Types: 1 Glasses of wine, 1 Cans of beer per  week    Comment: social (4-6 drinks/month)   Drug use: Never   Sexual activity: Yes  Other Topics Concern   Not on file  Social History Narrative   Lives at home with hubby   Social Drivers of Health   Financial Resource Strain: Patient Declined (09/08/2023)   Received from [facility]   Overall Financial Resource Strain (CARDIA)    Difficulty of Paying Living Expenses: Patient declined  Food Insecurity: Patient Declined (09/08/2023)   Received from [facility]   Hunger Vital Sign    Worried About Running Out of Food in the Last Year: Patient declined    Ran Out of Food in the Last Year: Patient declined   Transportation Needs: Patient Declined (09/08/2023)   Received from [facility] - Transportation    In the past 12 months, has lack of transportation kept you from medical appointments or from getting medications?: Patient declined    Lack of Transportation (Non-Medical): Patient declined  Physical Activity: Unknown (03/07/2023)   Exercise Vital Sign    Days of Exercise per Week: 4 days    Minutes of Exercise per Session: Patient declined  Stress: No Stress Concern Present (03/07/2023)   [employer] of Occupational Health - Occupational Stress Questionnaire    Feeling of Stress : Not at all  Social Connections: Moderately Integrated (03/07/2023)   Social Connection and Isolation Panel [NHANES]    Frequency of Communication with Friends and Family: More than three times a week    Frequency of Social Gatherings with Friends and Family: Twice a week    Attends Religious Services: 1 to 4 times per year    Active Member of [employer] or Organizations: No    Attends Engineer, structural: Not on file    Marital Status: Married  Catering manager Violence: Not on file    Family History  Problem Relation Age of Onset   Breast cancer Mother        dx 10s; 2nd primary at 92   Breast cancer Paternal Grandmother        dx 13s; deceased 98s   Breast cancer Paternal Aunt        dx 51s; currently 78s   Breast cancer Cousin        dx 31s; currently late 68s; daughter of a paternal uncle   Lung cancer Paternal Uncle        smoker; deceased     Current Outpatient Medications:    ALPRAZolam (XANAX) 0.25 MG tablet, Take 1 tablet (0.25 mg total) by mouth daily as needed for anxiety., Disp: 30 tablet, Rfl: 0   calcium-vitamin D (OSCAL WITH D) 500-200 MG-UNIT tablet, Take 1 tablet by mouth daily., Disp: , Rfl:    Collagen-Vitamin C-Biotin (COLLAGEN) 500-50-0.8 MG CAPS, Take by mouth., Disp: , Rfl:    Cyanocobalamin (B-12) 2500 MCG SUBL, Place 2,500 mcg under the  tongue daily., Disp: , Rfl:    hydrochlorothiazide (MICROZIDE) 12.5 MG capsule, TAKE 1 CAPSULE BY MOUTH EVERY DAY, Disp: 30 capsule, Rfl: 1   ibuprofen (ADVIL) 800 MG tablet, Take 800 mg by mouth every 8 (eight) hours as needed for headache., Disp: , Rfl:    levocetirizine (XYZAL) 5 MG tablet, Take 5 mg by mouth daily. , Disp: , Rfl:    meclizine (ANTIVERT) 25 MG tablet, Take 25 mg by mouth 3 (three) times daily as needed for dizziness., Disp: , Rfl:    Multiple Vitamin (  MULTIVITAMIN WITH MINERALS) TABS tablet, Take 1 tablet by mouth daily., Disp: , Rfl:    omeprazole (PRILOSEC) 40 MG capsule, TAKE 1 CAPSULE (40 MG TOTAL) BY MOUTH DAILY., Disp: 30 capsule, Rfl: 0   potassium chloride (KLOR-CON) 10 MEQ tablet, Take 99 mEq by mouth daily., Disp: , Rfl:    sertraline (ZOLOFT) 50 MG tablet, Take 50 mg by mouth daily. (Patient not taking: Reported on 10/16/2023), Disp: , Rfl:    tamoxifen (NOLVADEX) 20 MG tablet, Take 1 tablet (20 mg total) by mouth daily., Disp: 90 tablet, Rfl: 2   traMADol (ULTRAM) 50 MG tablet, Take 1 tablet (50 mg total) by mouth every 6 (six) hours as needed (breast pain)., Disp: 30 tablet, Rfl: 0   traZODone (DESYREL) 50 MG tablet, TAKE 1 TABLET BY MOUTH EVERYDAY AT BEDTIME, Disp: 39 tablet, Rfl: 1   triamcinolone (NASACORT) 55 MCG/ACT AERO nasal inhaler, Place 1 spray into the nose daily., Disp: , Rfl:   Physical exam:  Vitals:   10/16/23 0858  BP: (!) 123/92  Pulse: 88  Resp: 18  Temp: 97.8 F (36.6 C)  TempSrc: Tympanic  SpO2: 100%  Weight: 164 lb (74.4 kg)   Physical Exam Cardiovascular:     Rate and Rhythm: Normal rate and regular rhythm.     Heart sounds: Normal heart sounds.  Pulmonary:     Effort: Pulmonary effort is normal.     Breath sounds: Normal breath sounds.  Skin:    General: Skin is warm and dry.  Neurological:     Mental Status: She is alert and oriented to person, place, and time.   Patient declined breast exam today due to soreness from her  recent surgery     Latest Ref Rng & Units 07/25/2023    2:00 PM  CMP  Glucose 70 - 99 mg/dL 91   BUN 6 - 20 mg/dL 8   Creatinine 1.61 - 0.96 mg/dL 0.45   Sodium 409 - 811 mmol/L 136   Potassium 3.5 - 5.1 mmol/L 4.2   Chloride 98 - 111 mmol/L 103   CO2 22 - 32 mmol/L 24   Calcium 8.9 - 10.3 mg/dL 8.9       Latest Ref Rng & Units 01/13/2023   11:33 AM  CBC  Hemoglobin 12.0 - 15.0 g/dL 91.4   Hematocrit 78.2 - 46.0 % 40.0      Assessment and plan- Patient is a 53 y.o. female with history of left breast cancer stage I ER/PR positive HER2 negative s/p lumpectomy and sentinel lymph node biopsy followed by adjuvant radiation therapy and presently on tamoxifen.  She is here for a routine follow-up visit   Clinically patient is doing well with no concerning signs and symptoms of recurrence based on today's exam.  She will be due for mammogram as well as bone density scan in July 2025 which I will schedule.  Patient has completed 5 years of tamoxifen this month.  Given that she was premenopausal at diagnosis but is postmenopausal presently, it was believed number to consider 5 additional years of endocrine therapy for her despite stage I breast cancer.  We discussed continuing with tamoxifen for additional 5 years versus switching to aromatase inhibitor.  Discussed risks and benefits of letrozole including all but not limited to hot flashes mood swings arthralgias and worsening bone health with the fact that aromatase inhibitors are better in terms of reducing breast cancer recurrence as compared to tamoxifen.  She is willing to try letrozole  at this point but if she is unable to tolerate it because of side effects she will likely go back to tamoxifen.  I will see her back in 6 months no labs    Visit Diagnosis 1. Encounter for follow-up surveillance of breast cancer   2. Encounter for monitoring tamoxifen therapy   3. Osteopenia of neck of right femur      Dr. Owens Shark, MD, MPH [facility]  at [facility] [phone] 10/16/2023 9:29 AM

## 2023-10-31 ENCOUNTER — Other Ambulatory Visit: Payer: Self-pay | Admitting: Family Medicine

## 2023-10-31 DIAGNOSIS — R601 Generalized edema: Secondary | ICD-10-CM

## 2023-10-31 DIAGNOSIS — R030 Elevated blood-pressure reading, without diagnosis of hypertension: Secondary | ICD-10-CM

## 2023-11-18 ENCOUNTER — Other Ambulatory Visit: Payer: Self-pay | Admitting: Family Medicine

## 2023-11-18 DIAGNOSIS — F5101 Primary insomnia: Secondary | ICD-10-CM

## 2023-11-24 ENCOUNTER — Other Ambulatory Visit: Payer: Self-pay | Admitting: Family Medicine

## 2023-11-24 DIAGNOSIS — R601 Generalized edema: Secondary | ICD-10-CM

## 2023-11-24 DIAGNOSIS — R030 Elevated blood-pressure reading, without diagnosis of hypertension: Secondary | ICD-10-CM

## 2023-12-21 ENCOUNTER — Encounter: Payer: Self-pay | Admitting: Oncology

## 2023-12-21 ENCOUNTER — Other Ambulatory Visit: Payer: Self-pay

## 2023-12-21 MED ORDER — TAMOXIFEN CITRATE 20 MG PO TABS: 20.0000 mg | ORAL_TABLET | Freq: Every day | ORAL | 2 refills | Status: AC

## 2024-01-09 ENCOUNTER — Encounter: Payer: Managed Care, Other (non HMO) | Admitting: Plastic Surgery

## 2024-01-23 ENCOUNTER — Other Ambulatory Visit: Payer: Self-pay | Admitting: General Surgery

## 2024-01-23 DIAGNOSIS — Z1231 Encounter for screening mammogram for malignant neoplasm of breast: Secondary | ICD-10-CM

## 2024-03-13 ENCOUNTER — Ambulatory Visit
Admission: RE | Admit: 2024-03-13 | Discharge: 2024-03-13 | Disposition: A | Discharge status: Home / Self Care | Source: Ambulatory Visit | Attending: General Surgery | Admitting: General Surgery

## 2024-03-13 DIAGNOSIS — Z1231 Encounter for screening mammogram for malignant neoplasm of breast: Secondary | ICD-10-CM | POA: Insufficient documentation

## 2024-04-15 ENCOUNTER — Inpatient Hospital Stay: Payer: Managed Care, Other (non HMO) | Admitting: Nurse Practitioner

## 2024-04-15 ENCOUNTER — Telehealth: Payer: Self-pay | Admitting: Nurse Practitioner

## 2024-04-15 NOTE — Telephone Encounter (Signed)
 Pt scheduled to see Tiffany Humphrey today. Pt called and canceled due to being sick. Pt stated she will call back to r/s when feeling better

## 2024-05-21 ENCOUNTER — Other Ambulatory Visit

## 2024-06-19 ENCOUNTER — Ambulatory Visit
Admission: RE | Admit: 2024-06-19 | Discharge: 2024-06-19 | Disposition: A | Discharge status: Home / Self Care | Source: Ambulatory Visit | Attending: Oncology | Admitting: Oncology

## 2024-06-19 DIAGNOSIS — Z5181 Encounter for therapeutic drug level monitoring: Secondary | ICD-10-CM | POA: Insufficient documentation

## 2024-06-19 DIAGNOSIS — Z853 Personal history of malignant neoplasm of breast: Secondary | ICD-10-CM | POA: Diagnosis present

## 2024-06-19 DIAGNOSIS — Z79810 Long term (current) use of selective estrogen receptor modulators (SERMs): Secondary | ICD-10-CM | POA: Insufficient documentation

## 2024-06-19 DIAGNOSIS — Z08 Encounter for follow-up examination after completed treatment for malignant neoplasm: Secondary | ICD-10-CM | POA: Diagnosis present
